# Patient Record
Sex: Female | Born: 1935 | Race: Black or African American | Hispanic: No | Smoking: Never smoker
Health system: Southern US, Community
[De-identification: ages and names within clinical notes are randomized; demographics above are authoritative.]

## PROBLEM LIST (undated history)

## (undated) DIAGNOSIS — M1990 Unspecified osteoarthritis, unspecified site: Secondary | ICD-10-CM

## (undated) DIAGNOSIS — I10 Essential (primary) hypertension: Secondary | ICD-10-CM

## (undated) DIAGNOSIS — T8859XA Other complications of anesthesia, initial encounter: Secondary | ICD-10-CM

## (undated) DIAGNOSIS — Z8489 Family history of other specified conditions: Secondary | ICD-10-CM

## (undated) DIAGNOSIS — T4145XA Adverse effect of unspecified anesthetic, initial encounter: Secondary | ICD-10-CM

## (undated) DIAGNOSIS — D869 Sarcoidosis, unspecified: Secondary | ICD-10-CM

## (undated) DIAGNOSIS — G309 Alzheimer's disease, unspecified: Secondary | ICD-10-CM

## (undated) DIAGNOSIS — E7800 Pure hypercholesterolemia, unspecified: Secondary | ICD-10-CM

## (undated) DIAGNOSIS — F0280 Dementia in other diseases classified elsewhere without behavioral disturbance: Secondary | ICD-10-CM

## (undated) DIAGNOSIS — F419 Anxiety disorder, unspecified: Secondary | ICD-10-CM

## (undated) HISTORY — DX: Essential (primary) hypertension: I10

## (undated) HISTORY — DX: Alzheimer's disease, unspecified: G30.9

## (undated) HISTORY — PX: BACK SURGERY: SHX140

## (undated) HISTORY — DX: Dementia in other diseases classified elsewhere, unspecified severity, without behavioral disturbance, psychotic disturbance, mood disturbance, and anxiety: F02.80

## (undated) HISTORY — PX: VAGINAL HYSTERECTOMY: SHX2639

## (undated) HISTORY — DX: Pure hypercholesterolemia, unspecified: E78.00

## (undated) HISTORY — PX: OTHER SURGICAL HISTORY: SHX169

## (undated) HISTORY — PX: AURAL ATRESIA REPAIR: SHX1202

## (undated) HISTORY — DX: Anxiety disorder, unspecified: F41.9

---

## 1998-07-13 ENCOUNTER — Encounter: Payer: Self-pay | Admitting: Internal Medicine

## 1998-07-13 ENCOUNTER — Ambulatory Visit (HOSPITAL_COMMUNITY): Admission: RE | Admit: 1998-07-13 | Discharge: 1998-07-13 | Payer: Self-pay | Admitting: Internal Medicine

## 1998-08-22 ENCOUNTER — Other Ambulatory Visit: Admission: RE | Admit: 1998-08-22 | Discharge: 1998-08-22 | Payer: Self-pay | Admitting: *Deleted

## 1999-07-17 ENCOUNTER — Encounter: Payer: Self-pay | Admitting: Internal Medicine

## 1999-07-17 ENCOUNTER — Ambulatory Visit (HOSPITAL_COMMUNITY): Admission: RE | Admit: 1999-07-17 | Discharge: 1999-07-17 | Payer: Self-pay | Admitting: Internal Medicine

## 1999-12-03 ENCOUNTER — Other Ambulatory Visit: Admission: RE | Admit: 1999-12-03 | Discharge: 1999-12-03 | Payer: Self-pay | Admitting: *Deleted

## 1999-12-06 ENCOUNTER — Encounter: Payer: Self-pay | Admitting: *Deleted

## 1999-12-06 ENCOUNTER — Encounter: Admission: RE | Admit: 1999-12-06 | Discharge: 1999-12-06 | Payer: Self-pay | Admitting: *Deleted

## 2000-07-20 ENCOUNTER — Encounter: Payer: Self-pay | Admitting: Internal Medicine

## 2000-07-20 ENCOUNTER — Ambulatory Visit (HOSPITAL_COMMUNITY): Admission: RE | Admit: 2000-07-20 | Discharge: 2000-07-20 | Payer: Self-pay | Admitting: Internal Medicine

## 2001-07-27 ENCOUNTER — Ambulatory Visit (HOSPITAL_COMMUNITY): Admission: RE | Admit: 2001-07-27 | Discharge: 2001-07-27 | Payer: Self-pay | Admitting: Internal Medicine

## 2001-07-27 ENCOUNTER — Encounter: Payer: Self-pay | Admitting: Internal Medicine

## 2001-11-09 ENCOUNTER — Ambulatory Visit (HOSPITAL_COMMUNITY): Admission: RE | Admit: 2001-11-09 | Discharge: 2001-11-09 | Payer: Self-pay | Admitting: Gastroenterology

## 2002-08-03 ENCOUNTER — Ambulatory Visit (HOSPITAL_COMMUNITY): Admission: RE | Admit: 2002-08-03 | Discharge: 2002-08-03 | Payer: Self-pay | Admitting: Internal Medicine

## 2002-08-03 ENCOUNTER — Encounter: Payer: Self-pay | Admitting: Internal Medicine

## 2003-08-04 ENCOUNTER — Ambulatory Visit (HOSPITAL_COMMUNITY): Admission: RE | Admit: 2003-08-04 | Discharge: 2003-08-04 | Payer: Self-pay | Admitting: Internal Medicine

## 2004-04-22 ENCOUNTER — Ambulatory Visit (HOSPITAL_COMMUNITY): Admission: RE | Admit: 2004-04-22 | Discharge: 2004-04-22 | Payer: Self-pay | Admitting: Internal Medicine

## 2004-08-05 ENCOUNTER — Ambulatory Visit (HOSPITAL_COMMUNITY): Admission: RE | Admit: 2004-08-05 | Discharge: 2004-08-05 | Payer: Self-pay | Admitting: Internal Medicine

## 2004-11-05 ENCOUNTER — Ambulatory Visit (HOSPITAL_COMMUNITY): Admission: RE | Admit: 2004-11-05 | Discharge: 2004-11-05 | Payer: Self-pay | Admitting: Gastroenterology

## 2005-08-06 ENCOUNTER — Ambulatory Visit (HOSPITAL_COMMUNITY): Admission: RE | Admit: 2005-08-06 | Discharge: 2005-08-06 | Payer: Self-pay | Admitting: Internal Medicine

## 2006-01-11 ENCOUNTER — Encounter: Admission: RE | Admit: 2006-01-11 | Discharge: 2006-01-11 | Payer: Self-pay | Admitting: Internal Medicine

## 2006-08-10 ENCOUNTER — Ambulatory Visit (HOSPITAL_COMMUNITY): Admission: RE | Admit: 2006-08-10 | Discharge: 2006-08-10 | Payer: Self-pay | Admitting: Internal Medicine

## 2007-08-09 ENCOUNTER — Ambulatory Visit (HOSPITAL_COMMUNITY): Admission: RE | Admit: 2007-08-09 | Discharge: 2007-08-09 | Payer: Self-pay | Admitting: Internal Medicine

## 2008-08-10 ENCOUNTER — Ambulatory Visit (HOSPITAL_COMMUNITY): Admission: RE | Admit: 2008-08-10 | Discharge: 2008-08-10 | Payer: Self-pay | Admitting: Internal Medicine

## 2009-08-17 ENCOUNTER — Ambulatory Visit (HOSPITAL_COMMUNITY): Admission: RE | Admit: 2009-08-17 | Discharge: 2009-08-17 | Payer: Self-pay | Admitting: Internal Medicine

## 2010-01-24 ENCOUNTER — Ambulatory Visit (HOSPITAL_COMMUNITY)
Admission: RE | Admit: 2010-01-24 | Discharge: 2010-01-24 | Payer: Self-pay | Source: Home / Self Care | Admitting: Gastroenterology

## 2010-08-07 ENCOUNTER — Other Ambulatory Visit (HOSPITAL_COMMUNITY): Payer: Self-pay | Admitting: Internal Medicine

## 2010-08-07 DIAGNOSIS — Z1231 Encounter for screening mammogram for malignant neoplasm of breast: Secondary | ICD-10-CM

## 2010-08-20 ENCOUNTER — Ambulatory Visit (HOSPITAL_COMMUNITY)
Admission: RE | Admit: 2010-08-20 | Discharge: 2010-08-20 | Disposition: A | Discharge status: Home / Self Care | Payer: Medicare Other | Source: Ambulatory Visit | Attending: Internal Medicine | Admitting: Internal Medicine

## 2010-08-20 DIAGNOSIS — Z1231 Encounter for screening mammogram for malignant neoplasm of breast: Secondary | ICD-10-CM

## 2011-07-14 ENCOUNTER — Other Ambulatory Visit (HOSPITAL_COMMUNITY): Payer: Self-pay | Admitting: Internal Medicine

## 2011-07-14 DIAGNOSIS — Z1231 Encounter for screening mammogram for malignant neoplasm of breast: Secondary | ICD-10-CM

## 2011-08-22 ENCOUNTER — Ambulatory Visit (HOSPITAL_COMMUNITY)
Admission: RE | Admit: 2011-08-22 | Discharge: 2011-08-22 | Disposition: A | Discharge status: Home / Self Care | Payer: Medicare Other | Source: Ambulatory Visit | Attending: Internal Medicine | Admitting: Internal Medicine

## 2011-08-22 DIAGNOSIS — Z1231 Encounter for screening mammogram for malignant neoplasm of breast: Secondary | ICD-10-CM | POA: Insufficient documentation

## 2012-08-27 ENCOUNTER — Ambulatory Visit: Payer: BC Managed Care – PPO | Admitting: Neurology

## 2012-09-06 ENCOUNTER — Ambulatory Visit (INDEPENDENT_AMBULATORY_CARE_PROVIDER_SITE_OTHER): Payer: Medicare PPO | Admitting: Neurology

## 2012-09-06 ENCOUNTER — Encounter: Payer: Self-pay | Admitting: Neurology

## 2012-09-06 VITALS — BP 138/84 | HR 81 | Ht 62.0 in | Wt 137.0 lb

## 2012-09-06 DIAGNOSIS — E7800 Pure hypercholesterolemia, unspecified: Secondary | ICD-10-CM | POA: Insufficient documentation

## 2012-09-06 DIAGNOSIS — R413 Other amnesia: Secondary | ICD-10-CM

## 2012-09-06 DIAGNOSIS — F411 Generalized anxiety disorder: Secondary | ICD-10-CM

## 2012-09-06 DIAGNOSIS — H8110 Benign paroxysmal vertigo, unspecified ear: Secondary | ICD-10-CM | POA: Insufficient documentation

## 2012-09-06 DIAGNOSIS — F419 Anxiety disorder, unspecified: Secondary | ICD-10-CM

## 2012-09-07 LAB — COMPREHENSIVE METABOLIC PANEL
BUN/Creatinine Ratio: 14 (ref 11–26)
CO2: 28 mmol/L (ref 19–28)
Calcium: 9.6 mg/dL (ref 8.6–10.2)
Chloride: 103 mmol/L (ref 97–108)
Creatinine, Ser: 1.01 mg/dL — ABNORMAL HIGH (ref 0.57–1.00)
GFR calc Af Amer: 59 mL/min/{1.73_m2} — ABNORMAL LOW (ref >59)
GFR calc non Af Amer: 52 mL/min/{1.73_m2} — ABNORMAL LOW (ref >59)
Globulin, Total: 3 g/dL (ref 1.5–4.5)
Glucose: 84 mg/dL (ref 65–99)
Potassium: 4.1 mmol/L (ref 3.5–5.2)
Sodium: 142 mmol/L (ref 134–144)
Total Bilirubin: 0.3 mg/dL (ref 0.0–1.2)
Total Protein: 6.9 g/dL (ref 6.0–8.5)

## 2012-09-07 LAB — CBC
Hemoglobin: 12.3 g/dL (ref 11.1–15.9)
Platelets: 214 10*3/uL (ref 155–379)
RDW: 14 % (ref 12.3–15.4)

## 2012-09-07 LAB — C-REACTIVE PROTEIN: CRP: 4.9 mg/L (ref 0.0–4.9)

## 2012-09-08 NOTE — Progress Notes (Signed)
Kelly Cross is a 79 years old right-handed African American female, accompanied by her son, referred by her primary care physician for evaluation of memory loss    She has past medical history of pulmonary sarcoidosis, was treated with steroid more than 40 years ago, also history of hypertension, anxiety, bilateral cataract surgery, she had college degree, taught sixth-grade transiently, was a homemaker,, but very actively involved in church, community service, her son stated that she really the captain of her family, has been taking care of her sick husband, who suffered Alzheimer's disease, her son who has schizophrenia, was dedicated.  She was noted to get frustrated easily over the past one year, which is not her normal self, also began to make mistakes,  she forgets to take her medications sometimes, but she is still very active at home, she is supervising her son, who suffers schizophrenia, she is  his main caregiver, supervising his medications, she is driving only short distance, but even that she got lost sometimes, she still pay home bills, but her son began to step in to help her,  She denies significant visual loss, no lateralized motor or sensory deficit, her son provide most of the story  Review of systems she complains of hearing loss, constipation, incontinence, cramps, skin sensitivity, memory loss, confusion, insomnia, vastus lateralis, anxiety, not enough sleeps, otherwise review of system was negative  PHYSICAL EXAMINATOINS:  Generalized: In no acute distress  Neck: Supple, no carotid bruits   Cardiac: Regular rate rhythm  Pulmonary: Clear to auscultation bilaterally  Musculoskeletal: No deformity  Neurological examination  Mentation: Alert oriented to time, place, history taking, and causual conversation, MMSE 27/30, she missed 3/3 recalls  Cranial nerve II-XII: Pupils were equal round reactive to light extraocular movements were full, visual field were full on  confrontational test. facial sensation and strength were normal. hearing was intact to finger rubbing bilaterally. Uvula tongue midline.  head turning and shoulder shrug and were normal and symmetric.Tongue protrusion into cheek strength was normal.  Motor: normal tone, bulk and strength.  Sensory: Intact to fine touch, pinprick, preserved vibratory sensation, and proprioception at toes.  Coordination: Normal finger to nose, heel-to-shin bilaterally there was no truncal ataxia  Gait: Rising up from seated position without assistance, normal stance, without trunk ataxia, moderate stride, good arm swing, smooth turning, able to perform tiptoe, and heel walking without difficulty.   Romberg signs: Negative  Deep tendon reflexes: Brachioradialis 2/2, biceps 2/2, triceps 2/2, patellar 2/2, Achilles 2/2, plantar responses were flexor bilaterally.  Assessment and plan:  79 years old right-handed [place] American female, with gradual onset of memory trouble for one year, most consistent with Alzheimer's disease, Mini-Mental Status Examination is 27 out of 30, she missed 3 out of 3 recalls  1, complete evaluation with MRI of the brain,  2. , laboratory evaluation  3, return to clinic 1-2 months.

## 2012-09-10 ENCOUNTER — Other Ambulatory Visit (HOSPITAL_COMMUNITY): Payer: Self-pay | Admitting: Internal Medicine

## 2012-09-10 DIAGNOSIS — Z1231 Encounter for screening mammogram for malignant neoplasm of breast: Secondary | ICD-10-CM

## 2012-09-14 ENCOUNTER — Ambulatory Visit (HOSPITAL_COMMUNITY)
Admission: RE | Admit: 2012-09-14 | Discharge: 2012-09-14 | Disposition: A | Discharge status: Home / Self Care | Payer: Medicare PPO | Source: Ambulatory Visit | Attending: Internal Medicine | Admitting: Internal Medicine

## 2012-09-14 DIAGNOSIS — Z1231 Encounter for screening mammogram for malignant neoplasm of breast: Secondary | ICD-10-CM | POA: Insufficient documentation

## 2012-09-15 ENCOUNTER — Ambulatory Visit
Admission: RE | Admit: 2012-09-15 | Discharge: 2012-09-15 | Disposition: A | Discharge status: Home / Self Care | Payer: Medicare PPO | Source: Ambulatory Visit | Attending: Neurology | Admitting: Neurology

## 2012-09-15 DIAGNOSIS — R413 Other amnesia: Secondary | ICD-10-CM

## 2012-09-15 DIAGNOSIS — F419 Anxiety disorder, unspecified: Secondary | ICD-10-CM

## 2012-09-15 DIAGNOSIS — E7800 Pure hypercholesterolemia, unspecified: Secondary | ICD-10-CM

## 2012-09-15 DIAGNOSIS — H8110 Benign paroxysmal vertigo, unspecified ear: Secondary | ICD-10-CM

## 2012-09-16 NOTE — Progress Notes (Signed)
Quick Note:  Please call patient, age related changes on MRI brain, no change in treatment plan ______ 

## 2012-09-20 NOTE — Progress Notes (Signed)
Quick Note:  Spoke to son and relayed age related changes on MRI brain, per Dr. Terrace Arabia. They are to keep follow up appt. ______

## 2012-10-06 ENCOUNTER — Ambulatory Visit (INDEPENDENT_AMBULATORY_CARE_PROVIDER_SITE_OTHER): Payer: Medicare PPO | Admitting: Neurology

## 2012-10-06 ENCOUNTER — Encounter: Payer: Self-pay | Admitting: Neurology

## 2012-10-06 VITALS — BP 148/70 | HR 58 | Ht 62.0 in | Wt 137.5 lb

## 2012-10-06 DIAGNOSIS — R413 Other amnesia: Secondary | ICD-10-CM

## 2012-10-06 MED ORDER — MEMANTINE HCL ER 28 MG PO CP24: 28.0000 mg | ORAL_CAPSULE | Freq: Every day | ORAL | Status: DC

## 2012-10-06 MED ORDER — MEMANTINE HCL ER 7 & 14 & 21 &28 MG PO CP24: 1.0000 | ORAL_CAPSULE | Freq: Every day | ORAL | Status: DC

## 2012-10-06 NOTE — Progress Notes (Signed)
Kelly Cross is a 79 years old right-handed African American female, accompanied by her son, referred by her primary care physician for evaluation of memory loss    She has past medical history of pulmonary sarcoidosis, was treated with steroid more than 40 years ago, also history of hypertension, anxiety, bilateral cataract surgery, she had college degree, taught sixth-grade transiently, was a homemaker,, but very actively involved in church, community service, her son stated that she really the captain of her family, has been taking care of her sick husband, who suffered Alzheimer's disease, her son who has schizophrenia, was dedicated.  She was noted to get frustrated easily over the past one year, which is not her normal self, also began to make mistakes,  she forgets to take her medications sometimes, but she is still very active at home, she is supervising her son, who suffers schizophrenia, she is  his main caregiver, supervising his medications, she is driving only short distance, but even that she got lost sometimes, she still pay home bills, but her son began to step in to help her,  She denies significant visual loss, no lateralized motor or sensory deficit, her son provide most of the story  UPDATE May 14th 2014: MRI of the brain showed mild atrophy, laboratory evaluation was normal, including B12,  Son reported, she was highly functional, now struggling with her daily activity, no longer driving  Review of systems she complains of hearing loss, constipation, incontinence, cramps, skin sensitivity, memory loss, confusion, insomnia, vastus lateralis, anxiety, not enough sleeps, otherwise review of system was negative  PHYSICAL EXAMINATOINS:  Generalized: In no acute distress  Neck: Supple, no carotid bruits   Cardiac: Regular rate rhythm  Pulmonary: Clear to auscultation bilaterally  Musculoskeletal: No deformity  Neurological examination  Mentation: Alert oriented to time,  place, history taking, and causual conversation, MMSE 26/30, she missed 2/3 recalls, has difficulty spelling world backwards  Cranial nerve II-XII: Pupils were equal round reactive to light extraocular movements were full, visual field were full on confrontational test. facial sensation and strength were normal. hearing was intact to finger rubbing bilaterally. Uvula tongue midline.  head turning and shoulder shrug and were normal and symmetric.Tongue protrusion into cheek strength was normal.  Motor: normal tone, bulk and strength.  Sensory: Intact to fine touch, pinprick, preserved vibratory sensation, and proprioception at toes.  Coordination: Normal finger to nose, heel-to-shin bilaterally there was no truncal ataxia  Gait: Rising up from seated position without assistance, normal stance, without trunk ataxia, moderate stride, good arm swing, smooth turning, able to perform tiptoe, and heel walking without difficulty.   Romberg signs: Negative  Deep tendon reflexes: Brachioradialis 2/2, biceps 2/2, triceps 2/2, patellar 2/2, Achilles 2/2, plantar responses were flexor bilaterally.  Assessment and plan:  79 years old right-handed [place] American female, with gradual onset of memory trouble for one year, most consistent with Alzheimer's disease, Mini-Mental Status Examination is 26 out of 30, she missed 2 out of 3 recalls  1. starting Namenda titrating to 28 mg every day. 2. moderate exercise. 3. may consider LZAX trial if her MMSE is <=to 26 persistently. 4. RTC in 4 months

## 2012-10-29 ENCOUNTER — Other Ambulatory Visit: Payer: Self-pay | Admitting: Neurology

## 2012-11-16 ENCOUNTER — Telehealth: Payer: Self-pay | Admitting: *Deleted

## 2012-11-16 NOTE — Telephone Encounter (Signed)
Pt's son Burton Apley is concerned with the medication Memantine HCl ER (NAMENDA XR) 28 MG that the pt has been on. He fills that she is falling backwards on things, seeing changes per son. Mom left the house while son was gone and had keys with her but when he got back she still had keys with her but didn't realize that she did. Difficulty oriented in the home by herself, looking for things that may take her all day but normally doesn't. Pt's son needs someone to please call him about his mom and he thinks she may need to come in earlier. You can reach son on his cell # Thanks

## 2012-11-16 NOTE — Telephone Encounter (Signed)
I have called her son, she locked herself out of the house with her key in her pocket,  She is also more frustrated with her son who suffered schizophrenia. She is no longer driving. She is more confused.

## 2013-02-10 ENCOUNTER — Ambulatory Visit (INDEPENDENT_AMBULATORY_CARE_PROVIDER_SITE_OTHER): Payer: Medicare PPO | Admitting: Neurology

## 2013-02-10 ENCOUNTER — Encounter: Payer: Self-pay | Admitting: Neurology

## 2013-02-10 VITALS — BP 144/73 | HR 75 | Ht 63.0 in | Wt 130.0 lb

## 2013-02-10 DIAGNOSIS — F419 Anxiety disorder, unspecified: Secondary | ICD-10-CM

## 2013-02-10 DIAGNOSIS — E7800 Pure hypercholesterolemia, unspecified: Secondary | ICD-10-CM

## 2013-02-10 DIAGNOSIS — F411 Generalized anxiety disorder: Secondary | ICD-10-CM

## 2013-02-10 DIAGNOSIS — R413 Other amnesia: Secondary | ICD-10-CM

## 2013-02-10 NOTE — Progress Notes (Signed)
Kelly Cross is a 79 years old right-handed African American female, accompanied by her son, referred by her primary care physician for evaluation of memory loss    She has past medical history of pulmonary sarcoidosis, was treated with steroid more than 40 years ago, also history of hypertension, anxiety, bilateral cataract surgery, she had college degree, taught sixth-grade transiently, was a homemaker,, but very actively involved in church, community service, her son stated that she really the captain of her family, has been taking care of her sick husband, who suffered Alzheimer's disease, her son who has schizophrenia, was dedicated.  She was noted to get frustrated easily over the past one year, which is not her normal self, also began to make mistakes,  she forgets to take her medications sometimes, but she is still very active at home, she is supervising her son, who suffers schizophrenia, she is  his main caregiver, supervising his medications, she is driving only short distance, but even that she got lost sometimes, she still pay home bills, but her son began to step in to help her,  She denies significant visual loss, no lateralized motor or sensory deficit, her son provide most of the story  MRI of the brain showed mild atrophy, laboratory evaluation was normal, including B12,  Son reported, she was highly functional, now struggling with her daily activity, no longer driving, she also has hard of hearing   UPDATE Sept 18th 2014: She is tolerating Namenda xr 28 mg every day, there was no significant side effect, but she has increased anxiety dealing with her son who suffered mental illness, she has not taking Zoloft 100 mg, Lunesta 2 mg every night, she complains some morning drowsiness, mild gait difficulty,  Review of systems she complains of hearing loss, memory loss, confusion, unsteady gait, depression, anxiety, incontinence  PHYSICAL EXAMINATOINS:  Generalized: In no acute  distress  Neck: Supple, no carotid bruits   Cardiac: Regular rate rhythm  Pulmonary: Clear to auscultation bilaterally  Musculoskeletal: No deformity  Neurological examination  Mentation: Alert oriented to time, place, history taking, and causual conversation, MMSE 28/30, she missed 1/3 recalls, has difficulty spelling world backwards  Cranial nerve II-XII: Pupils were equal round reactive to light extraocular movements were full, visual field were full on confrontational test. facial sensation and strength were normal. Hard of hearing . Uvula tongue midline.  head turning and shoulder shrug and were normal and symmetric.Tongue protrusion into cheek strength was normal.  Motor: normal tone, bulk and strength.  Sensory: Intact to fine touch, pinprick, preserved vibratory sensation, and proprioception at toes.  Coordination: Normal finger to nose, heel-to-shin bilaterally there was no truncal ataxia  Gait: Rising up from seated position by pushing on chair arm, cautious, mildly unsteady, could not perform tandem walking,  Romberg signs: Negative  Deep tendon reflexes: Brachioradialis 2/2, biceps 2/2, triceps 2/2, patellar 2/2, Achilles 2/2, plantar responses were flexor bilaterally.  Assessment and plan:  79 years old right-handed [place] American female, with gradual onset of memory trouble for one year, most consistent with Alzheimer's disease, Mini-Mental Status Examination is 26 out of 30, she missed 2 out of 3 recalls  1.  Namenda xr 28 mg every day. Add on aricept 10mg  qday 2. moderate exercise. 3.  RTC to clinic with Eber Jones in 12 months 4. Her increased gait difficulty is most likely due to medicine side effect.

## 2013-02-11 ENCOUNTER — Other Ambulatory Visit: Payer: Self-pay

## 2013-02-11 ENCOUNTER — Telehealth: Payer: Self-pay | Admitting: Neurology

## 2013-02-11 MED ORDER — DONEPEZIL HCL 5 MG PO TABS: 5.0000 mg | ORAL_TABLET | Freq: Every day | ORAL | Status: DC

## 2013-02-11 MED ORDER — DONEPEZIL HCL 10 MG PO TABS: 10.0000 mg | ORAL_TABLET | Freq: Every day | ORAL | Status: DC

## 2013-02-11 NOTE — Telephone Encounter (Signed)
Patient's son left message that the Aricept was not called into [facility] on [address].  I checked and prescription was faxed after he left message.  I called son but his mailbox was full and I was unable to leave message.

## 2013-02-11 NOTE — Telephone Encounter (Signed)
Son came into the office requesting Rx for Aricept.  OV note says patient will start Aricept, however, it was not added to the med list.  Rx has been added and sent to the pharmacy.

## 2013-02-15 ENCOUNTER — Telehealth: Payer: Self-pay | Admitting: Neurology

## 2013-02-17 NOTE — Telephone Encounter (Signed)
I called and and spoke to son.  He had questions about taking the aricept.  Pharmacy stated pm and Dr. Terrace Arabia had said am.   I told him that I would follow Dr. Zannie Cove recommendation about taking this medication.

## 2013-03-06 ENCOUNTER — Other Ambulatory Visit: Payer: Self-pay | Admitting: Neurology

## 2013-08-18 ENCOUNTER — Telehealth: Payer: Self-pay | Admitting: Nurse Practitioner

## 2013-08-18 NOTE — Telephone Encounter (Signed)
Called pt's son [name] to inform him about the [facility] and gave him the number and also advised the son that the pt's PCP can recommend a dermatologist for the pt and that he can stop at any pharmacy for a medication box. I advised the son that if the pt has any other problems, questions or concerns to call the office. Son verbalized understanding.

## 2013-08-18 NOTE — Telephone Encounter (Signed)
°  Patient's son walked into the lobby requesting three things: 1. Recommendation for an  in-home aide to help with his mother who has Alzheimer's. 2. Where he can purchase a medication box that holds 35 squares. 3. Recommendation for a dermatologist.   Please call to advise

## 2013-08-24 ENCOUNTER — Other Ambulatory Visit: Payer: Self-pay | Admitting: Neurology

## 2013-09-22 ENCOUNTER — Telehealth: Payer: Self-pay | Admitting: Nurse Practitioner

## 2013-09-22 NOTE — Telephone Encounter (Signed)
Patient's son came to the lobby asking for a sooner appointment for his mother. For the past 12 days she has asked every day when is my appointment with "the lady."  Son wants the appointment to be with Dr. Terrace ArabiaYan.  Son feels mother's health declining very quickly. He believes she needs nursing care in the home. He is wanting a call back from a nurse to discuss what his options are. Please call to advise.

## 2013-09-23 NOTE — Telephone Encounter (Signed)
Patient's son is returning your call.

## 2013-09-23 NOTE — Telephone Encounter (Signed)
I called and LMVM for son f/u on his message.

## 2013-09-26 NOTE — Telephone Encounter (Signed)
Pt came by office.   I spoke with him.  He is concerned that his mom is getting worse.  She has been asking about an appt with lady doctor.  He reminds her that appt is in September 2015 with NP.  He is POA of pt and also of his brother that has schizophrenia.  Mother has been main caregiver for son with schizophrenia.  Changes with memory, but also some other issues (pain, anxiety, sleep).  See's Dr. Earl Galasborne. Pcp.  Will soon need pcp since he will be leaving the practice he's with and doing more sleep?  I gave him the in formation on [facility].  Told him to call and speak to them.  [facility], etc.  I told him when in for visit to mention this issue.  (may benefit also SW).  [facility] would incorporate all this if qualifies and is excepted.  Made appt sooner with LLam/ NP 10-11-13 at 1130 (not confirmed with son-Ogden yet).

## 2013-09-27 NOTE — Telephone Encounter (Signed)
Mailbox full

## 2013-09-28 ENCOUNTER — Encounter: Payer: Self-pay | Admitting: *Deleted

## 2013-09-28 NOTE — Telephone Encounter (Signed)
  I called again and mailbox full cannot LM.  Mailed letter.

## 2013-10-03 ENCOUNTER — Other Ambulatory Visit: Payer: Self-pay | Admitting: Neurology

## 2013-10-06 ENCOUNTER — Telehealth: Payer: Self-pay | Admitting: Neurology

## 2013-10-06 NOTE — Telephone Encounter (Signed)
Patient's son calling to state [facility] is [facility], please call and advise patient.

## 2013-10-06 NOTE — Telephone Encounter (Signed)
Spoke with son and he said that he was just coming in to confirm the  appt.for 10/11/13

## 2013-10-11 ENCOUNTER — Encounter: Payer: Self-pay | Admitting: Nurse Practitioner

## 2013-10-11 ENCOUNTER — Ambulatory Visit (INDEPENDENT_AMBULATORY_CARE_PROVIDER_SITE_OTHER): Payer: Medicare PPO | Admitting: Nurse Practitioner

## 2013-10-11 VITALS — BP 157/65 | HR 47 | Ht 62.0 in | Wt 128.0 lb

## 2013-10-11 DIAGNOSIS — G309 Alzheimer's disease, unspecified: Principal | ICD-10-CM

## 2013-10-11 DIAGNOSIS — R269 Unspecified abnormalities of gait and mobility: Secondary | ICD-10-CM

## 2013-10-11 DIAGNOSIS — R2681 Unsteadiness on feet: Secondary | ICD-10-CM | POA: Insufficient documentation

## 2013-10-11 DIAGNOSIS — F0280 Dementia in other diseases classified elsewhere without behavioral disturbance: Secondary | ICD-10-CM | POA: Insufficient documentation

## 2013-10-11 NOTE — Progress Notes (Signed)
PATIENT: Kelly Cross DOB: 11-19-1926  REASON FOR VISIT: follow up for memory loss HISTORY FROM: patient  HISTORY OF PRESENT ILLNESS: Kelly Cross is a 80 years old right-handed African American female, accompanied by her son, referred by her primary care physician for evaluation of memory loss.   She has past medical history of pulmonary sarcoidosis, was treated with steroid more than 40 years ago, also history of hypertension, anxiety, bilateral cataract surgery, she had college degree, taught sixth-grade transiently, was a homemaker,, but very actively involved in church, community service, her son stated that she really the captain of her family, has been taking care of her sick husband, who suffered Alzheimer's disease, her son who has schizophrenia, was dedicated.  She was noted to get frustrated easily over the past one year, which is not her normal self, also began to make mistakes, she forgets to take her medications sometimes, but she is still very active at home, she is supervising her son, who suffers schizophrenia, she is his main caregiver, supervising his medications, she is driving only short distance, but even that she got lost sometimes, she still pay home bills, but her son began to step in to help her,  She denies significant visual loss, no lateralized motor or sensory deficit, her son provide most of the story  MRI of the brain showed mild atrophy, laboratory evaluation was normal, including B12. Son reported, she was highly functional, now struggling with her daily activity, no longer driving, she also has hard of hearing   UPDATE Sept 18th 2014:  She is tolerating Namenda xr 28 mg every day, there was no significant side effect, but she has increased anxiety dealing with her son who suffered mental illness, she has not taking Zoloft 100 mg, Lunesta 2 mg every night, she complains some morning drowsiness, mild gait difficulty.  UPDATE 10/11/13 (LL):  Patient comes  in with son today who requested sooner revisit.  Son feels mother's health declining very quickly. He believes she needs 24 hour nursing care in the home, and he cannot be there at all times due to work.  He has looking in to having a nurse aide come in to the home, but having a schizophrenic brother in the home has made some agencies decline. Tolerating Namenda XR and Aricept, but having difficulty sleeping at night.  She is still independent with bathing, dressing, feeding, having urinary incontinence.  Needs help with all IADLs. Son states mother has long-term care insurance plan but he really wants to keep her at home if at all possible, wants to keep her safe.  Review of systems she complains of hearing loss, memory loss, cough, unsteady gait, anxiety, incontinence of bladder, frequent waking, daytime sleepiness  ALLERGIES: No Known Allergies  HOME MEDICATIONS: Outpatient Prescriptions Prior to Visit  Medication Sig Dispense Refill  . Acetaminophen (TYLENOL EXTRA STRENGTH PO) Take 500 mg by mouth 2 (two) times daily.      Marland Kitchen CALCIUM ASPARTATE PO Take by mouth daily.      Marland Kitchen donepezil (ARICEPT) 5 MG tablet Take 1 tablet (5 mg total) by mouth at bedtime. For one month then increase to 10mg  nightly.  (10mg  Rx has been sent separately)  30 tablet  0  . eszopiclone (LUNESTA) 2 MG TABS tablet Take 2 mg by mouth daily.      Marland Kitchen gabapentin (NEURONTIN) 300 MG capsule 300 mg 2 (two) times daily.      . hydrochlorothiazide (HYDRODIURIL) 25 MG tablet Take  25 mg by mouth daily.      . Multiple Vitamins-Minerals (CENTRUM SILVER PO) Take by mouth daily.      Marland Kitchen NAMENDA XR 28 MG CP24 TAKE 1 CAPSULE BY MOUTH EVERY DAY  30 capsule  0  . sertraline (ZOLOFT) 50 MG tablet 100 mg daily.       Marland Kitchen donepezil (ARICEPT) 10 MG tablet Take 1 tablet (10 mg total) by mouth at bedtime.  90 tablet  1  . Memantine HCl ER (NAMENDA XR TITRATION PACK) 7 & 14 & 21 &28 MG CP24 Take 1 capsule by mouth daily.  30 capsule  0   No  facility-administered medications prior to visit.     PHYSICAL EXAM  Filed Vitals:   10/11/13 1126  BP: 157/65  Pulse: 47  Height: 5\' 2"  (1.575 m)  Weight: 128 lb (58.06 kg)   Body mass index is 23.41 kg/(m^2). No exam data present   Generalized: In no acute distress  Neck: Supple, no carotid bruits  Cardiac: Regular rate rhythm  Pulmonary: Clear to auscultation bilaterally  Musculoskeletal: No deformity   Neurological examination  Mentation: Alert oriented to time, place, history taking, and casual conversation, MMSE 23/30, 0/3 recalls, has difficulty spelling world backwards. AFT 6 only, CDT 4/4, GDS 6 suggests depression. Cranial nerve II-XII: Pupils were equal round reactive to light extraocular movements were full, visual field were full on confrontational test. facial sensation and strength were normal. Hard of hearing. Uvula tongue midline. head turning and shoulder shrug and were normal and symmetric. Tongue protrusion into cheek strength was normal.  Motor: normal tone, bulk and strength.  Sensory: Intact to fine touch in all 4 extremities. Coordination: Normal finger to nose, heel-to-shin bilaterally there was no truncal ataxia  Gait: Rising up from seated position by pushing on chair arm, cautious, mildly unsteady, could not perform tandem walking,  Romberg signs: Negative  Deep tendon reflexes: Brachioradialis 2/2, biceps 2/2, triceps 2/2, patellar 2/2, Achilles 2/2, plantar responses were flexor bilaterally.   ASSESSMENT AND PLAN 80 year old right-handed [place] American female, with gradual onset of memory trouble for one year, most consistent with Alzheimer's disease, Mini-Mental Status Examination is 24 out of 30, she missed 3 out of 3 recalls, AFT 6, CDT 4/4, GSD 6.  Son is becoming increasingly concerned for his mother's safety while he is away from home and has no other family resources to help.  He is looking for resources in the community to continue having his  mother live in his home.  Currently she is mostly independent in ADLs, but needs help with all IADLs.  I would like to see if we can get her qualified for [facility] services, a referral will be made.  Son is in agreement.  1. Continue Namenda xr 28 mg every day. Aricept 10mg  q day  2. moderate exercise.  3. RTC to clinic with Dr. Terrace Arabia in 4 months 4. Referral to [facility], if denied, Home Health RN for home needs evaluation and PT for gait and balance training.  I had a long discussion with the patient and son regarding her memory problems, discussed available resources in the community and answered questions.   Orders Placed This Encounter  Procedures  . AMB REFERRAL TO [facility]   Tawny Asal Pheonix Clinkscale, MSN, NP-C 10/11/2013, 1:23 PM [facility] [address] [place], [place] [address] [phone]  Note: This document was prepared with digital dictation and possible smart phrase technology. Any  transcriptional errors that result from this process are unintentional.

## 2013-10-11 NOTE — Patient Instructions (Addendum)
We are referring you for the [facility] program, information was given during visit.  I will send a referral form by fax today.  You should receive a call from them within 2 weeks.  Continue Donepizil (Aricept) and Namenda.  You may try changing Aricept to taking in the morning to help with sleep at night.  Sometimes this medication is stimulating which keep patients awake.  Keep next regularly scheduled appointment with Dr. Terrace ArabiaYan.

## 2013-10-31 ENCOUNTER — Other Ambulatory Visit: Payer: Self-pay | Admitting: Neurology

## 2013-12-03 ENCOUNTER — Other Ambulatory Visit: Payer: Self-pay | Admitting: Neurology

## 2014-02-10 ENCOUNTER — Ambulatory Visit: Payer: Medicare PPO | Admitting: Nurse Practitioner

## 2014-02-13 ENCOUNTER — Ambulatory Visit (INDEPENDENT_AMBULATORY_CARE_PROVIDER_SITE_OTHER): Payer: Medicare PPO | Admitting: Neurology

## 2014-02-13 ENCOUNTER — Encounter (INDEPENDENT_AMBULATORY_CARE_PROVIDER_SITE_OTHER): Payer: Self-pay

## 2014-02-13 ENCOUNTER — Other Ambulatory Visit: Payer: Self-pay | Admitting: Neurology

## 2014-02-13 ENCOUNTER — Encounter: Payer: Self-pay | Admitting: Neurology

## 2014-02-13 VITALS — BP 147/58 | HR 67 | Ht 62.0 in | Wt 123.0 lb

## 2014-02-13 DIAGNOSIS — R413 Other amnesia: Secondary | ICD-10-CM

## 2014-02-13 DIAGNOSIS — R2681 Unsteadiness on feet: Secondary | ICD-10-CM

## 2014-02-13 DIAGNOSIS — R269 Unspecified abnormalities of gait and mobility: Secondary | ICD-10-CM

## 2014-02-13 MED ORDER — MEMANTINE HCL-DONEPEZIL HCL ER 28-10 MG PO CP24: 1.0000 | ORAL_CAPSULE | Freq: Every day | ORAL | Status: DC

## 2014-02-13 NOTE — Progress Notes (Signed)
PATIENT: Kelly Cross DOB: 03-03-1927  REASON FOR VISIT: follow up for memory loss HISTORY FROM: patient  HISTORY OF PRESENT ILLNESS: Kelly Cross is a 80 years old right-handed African American female, accompanied by her son, referred by her primary care physician for evaluation of memory loss.   She has past medical history of pulmonary sarcoidosis, was treated with steroid more than 40 years ago, also history of hypertension, anxiety, bilateral cataract surgery, she had college degree, taught sixth-grade transiently, was a homemaker,, but very actively involved in church, community service, her son stated that she really the captain of her family, has been taking care of her sick husband, who suffered Alzheimer's disease, her son who has schizophrenia, was dedicated.   She was noted to get frustrated easily over the past one year, which is not her normal self, also began to make mistakes, she forgets to take her medications sometimes, but she is still very active at home, she is supervising her son, who suffers schizophrenia, she is his main caregiver, supervising his medications, she is driving only short distance, but even that she got lost sometimes, she still pay home bills, but her son began to step in to help her She denies significant visual loss, no lateralized motor or sensory deficit, her son provide most of the story   MRI of the brain showed mild atrophy, laboratory evaluation was normal, including B12.  Son reported, she was highly functional, now struggling with her daily activity, no longer driving, she also has hard of hearing   UPDATE Sept 18th 2014:  She is tolerating Namenda xr 28 mg every day, there was no significant side effect, but she has increased anxiety dealing with her son who suffered mental illness, she has not taking Zoloft 100 mg, Lunesta 2 mg every night, she complains some morning drowsiness, mild gait difficulty.  UPDATE 10/11/13 (LL):  Patient  comes in with son today who requested sooner revisit.  Son feels mother's health declining very quickly. He believes she needs 24 hour nursing care in the home, and he cannot be there at all times due to work.  He has looking in to having a nurse aide come in to the home, but having a schizophrenic brother in the home has made some agencies decline. Tolerating Namenda XR and Aricept, but having difficulty sleeping at night.  She is still independent with bathing, dressing, feeding, having urinary incontinence.  Needs help with all IADLs. Son states mother has long-term care insurance plan but he really wants to keep her at home if at all possible, wants to keep her safe.  UPDATE Feb 13 2014: She continue decline with her memory, increased confusion, also mild unsteady gait, complains of frequent low back pain, taking Tylenol almost every night to sleep, she used to take gabapentin 300 mg twice a day for restless leg syndrome, was not sure it has been helpful, she is taking Namenda, and Aricept.  She still takes care of her younger son, who suffered  Schizophrenic.  Review of systems she complains of memory loss, mild gait difficulty, low back pain, ALLERGIES: No Known Allergies  HOME MEDICATIONS: Outpatient Prescriptions Prior to Visit  Medication Sig Dispense Refill  . Acetaminophen (TYLENOL EXTRA STRENGTH PO) Take 500 mg by mouth 2 (two) times daily.      Marland Kitchen CALCIUM ASPARTATE PO Take by mouth daily.      Marland Kitchen donepezil (ARICEPT) 5 MG tablet Take 1 tablet (5 mg total) by mouth at  bedtime. For one month then increase to  nightly.  (  Rx has been sent separately)  30 tablet  0  . eszopiclone (LUNESTA) 2 MG TABS tablet Take 2 mg by mouth daily.      Marland Kitchen gabapentin (NEURONTIN) 300 MG capsule 300 mg 2 (two) times daily.      . hydrochlorothiazide (HYDRODIURIL) 25 MG tablet Take 25 mg by mouth daily.      . Multiple Vitamins-Minerals (CENTRUM SILVER PO) Take by mouth daily.      Marland Kitchen NAMENDA XR 28 MG  CP24 TAKE ONE CAPSULE BY MOUTH EVERY DAY  30 capsule  3  . neomycin-polymyxin b-dexamethasone (MAXITROL) 3.5-10000-0.1 OINT       . omeprazole (PRILOSEC) 20 MG capsule Take 20 mg by mouth daily.      . prednisoLONE acetate (PRED FORTE) 1 % ophthalmic suspension Place 1 drop into both eyes 3 (three) times daily.      . sertraline (ZOLOFT) 50 MG tablet 100 mg daily.        No facility-administered medications prior to visit.     PHYSICAL EXAM  There were no vitals filed for this visit. There is no weight on file to calculate BMI. No exam data present   Generalized: In no acute distress  Neck: Supple, no carotid bruits  Cardiac: Regular rate rhythm  Pulmonary: Clear to auscultation bilaterally  Musculoskeletal: No deformity   Neurological examination  Mentation: Alert oriented to time, place, history taking, and casual conversation, MMSE 23/30, 0/3 recalls, has difficulty spelling world backwards. AFT 6 only, CDT 4/4, GDS 6 suggests depression. Cranial nerve II-XII: Pupils were equal round reactive to light extraocular movements were full, visual field were full on confrontational test. facial sensation and strength were normal. Hard of hearing. Uvula tongue midline. head turning and shoulder shrug and were normal and symmetric. Tongue protrusion into cheek strength was normal.  Motor: normal tone, bulk and strength.  Sensory: Intact to fine touch in all 4 extremities. Coordination: Normal finger to nose, heel-to-shin bilaterally there was no truncal ataxia  Gait: Rising up from seated position by pushing on chair arm, cautious, mildly unsteady, could not perform tandem walking,  Romberg signs: Negative  Deep tendon reflexes: Brachioradialis 2/2, biceps 2/2, triceps 2/2, patellar 2/2, Achilles 2/2, plantar responses were flexor bilaterally.   ASSESSMENT AND PLAN 80 year old right-handed [place] American female, with gradual onset of memory trouble for one year, most consistent with  Alzheimer's disease, Mini-Mental Status Examination is 29 out of 30 today, she missed 1out of 3 recalls.  Her son is becoming increasingly concerned for his mother's safety while he is away from home and has no other family resources to help.    1. Will start Namazaric one tab po qday  2.  Tapering off gabapentin 3. Home Physical therapy. 4.  RTC in 9-12 months with NP   Levert Feinstein, M.D. Ph.D. [facility] [address] [place], [place] [address] [phone]

## 2014-03-13 ENCOUNTER — Other Ambulatory Visit (HOSPITAL_COMMUNITY): Payer: Self-pay | Admitting: Internal Medicine

## 2014-03-13 DIAGNOSIS — Z1231 Encounter for screening mammogram for malignant neoplasm of breast: Secondary | ICD-10-CM

## 2014-03-21 ENCOUNTER — Ambulatory Visit: Payer: Medicare PPO | Admitting: Physical Therapy

## 2014-03-22 ENCOUNTER — Other Ambulatory Visit (HOSPITAL_COMMUNITY): Payer: Self-pay | Admitting: Internal Medicine

## 2014-03-22 ENCOUNTER — Ambulatory Visit: Payer: Medicare PPO

## 2014-03-22 ENCOUNTER — Ambulatory Visit (HOSPITAL_COMMUNITY)
Admission: RE | Admit: 2014-03-22 | Discharge: 2014-03-22 | Disposition: A | Discharge status: Home / Self Care | Payer: Medicare PPO | Source: Ambulatory Visit | Attending: Internal Medicine | Admitting: Internal Medicine

## 2014-03-22 DIAGNOSIS — Z1231 Encounter for screening mammogram for malignant neoplasm of breast: Secondary | ICD-10-CM | POA: Insufficient documentation

## 2014-03-24 ENCOUNTER — Ambulatory Visit: Payer: Medicare PPO | Attending: Internal Medicine

## 2014-03-24 DIAGNOSIS — H8110 Benign paroxysmal vertigo, unspecified ear: Secondary | ICD-10-CM | POA: Diagnosis not present

## 2014-03-24 DIAGNOSIS — Z5189 Encounter for other specified aftercare: Secondary | ICD-10-CM | POA: Insufficient documentation

## 2014-03-24 DIAGNOSIS — G309 Alzheimer's disease, unspecified: Secondary | ICD-10-CM | POA: Insufficient documentation

## 2014-03-24 DIAGNOSIS — I10 Essential (primary) hypertension: Secondary | ICD-10-CM | POA: Insufficient documentation

## 2014-03-24 DIAGNOSIS — F0280 Dementia in other diseases classified elsewhere without behavioral disturbance: Secondary | ICD-10-CM | POA: Diagnosis not present

## 2014-03-24 DIAGNOSIS — R269 Unspecified abnormalities of gait and mobility: Secondary | ICD-10-CM | POA: Diagnosis not present

## 2014-03-24 DIAGNOSIS — E780 Pure hypercholesterolemia: Secondary | ICD-10-CM | POA: Insufficient documentation

## 2014-04-06 ENCOUNTER — Encounter: Payer: Self-pay | Admitting: *Deleted

## 2014-04-18 ENCOUNTER — Encounter: Payer: Self-pay | Admitting: Physical Therapy

## 2014-04-18 ENCOUNTER — Ambulatory Visit: Payer: Medicare PPO | Attending: Internal Medicine | Admitting: Physical Therapy

## 2014-04-18 DIAGNOSIS — I10 Essential (primary) hypertension: Secondary | ICD-10-CM | POA: Diagnosis not present

## 2014-04-18 DIAGNOSIS — H8110 Benign paroxysmal vertigo, unspecified ear: Secondary | ICD-10-CM | POA: Insufficient documentation

## 2014-04-18 DIAGNOSIS — F0280 Dementia in other diseases classified elsewhere without behavioral disturbance: Secondary | ICD-10-CM | POA: Diagnosis not present

## 2014-04-18 DIAGNOSIS — E780 Pure hypercholesterolemia: Secondary | ICD-10-CM | POA: Diagnosis not present

## 2014-04-18 DIAGNOSIS — Z5189 Encounter for other specified aftercare: Secondary | ICD-10-CM | POA: Diagnosis not present

## 2014-04-18 DIAGNOSIS — R269 Unspecified abnormalities of gait and mobility: Secondary | ICD-10-CM

## 2014-04-18 DIAGNOSIS — G309 Alzheimer's disease, unspecified: Secondary | ICD-10-CM | POA: Insufficient documentation

## 2014-04-18 NOTE — Patient Instructions (Signed)
Tandem Walking  Near counter for safety Walk with each foot directly in front of other, heel of one foot touching toes of other foot with each step. Both feet straight ahead. Walk the line forward and backward 10 repetitions.   Copyright  VHI. All rights reserved.  FUNCTIONAL MOBILITY: Marching - Standing   March forward and backward along counter. _10__ reps per set,  _7__ days per week Hold onto a support.  Copyright  VHI. All rights reserved.  Braiding   Move to side: 1) cross right leg in front of left, 2) bring back leg out to side, then 3) cross right leg behind left, 4) bring left leg out to side. Continue sequence in same direction. Reverse sequence, moving in opposite direction. Repeat sequence __10__ times per session. Do _1___ sessions per day.   Copyright  VHI. All rights reserved.  Crossovers   Move to side: 1) cross right leg in front, then 2) bring back leg out to side. Repeat sequence in same direction. Reverse sequence, moving in opposite direction. Repeat sequence _10___ times per session. Do __1__ sessions per day.  Repeat crossing foot behind other leg 10 times.   Copyright  VHI. All rights reserved.  Side Stepping   Near counter. Side step to right 10 steps and to left 10 steps.  Copyright  VHI. All rights reserved.  Hip Backward Kick   Using a chair or counter for balance, keep legs shoulder width apart and toes pointed for- ward. Slowly extend one leg back, keeping knee straight. Do not lean forward. Repeat with other leg. Alternate legs to work balance. Repeat __10__ times. Do __1__ sessions per day.  http://gt2.exer.us/341   Copyright  VHI. All rights reserved.  ABDUCTION: Standing (Active)   Stand, feet flat. Lift right leg out to side. No weights . Alternate legs for difficulty. Complete  __10_ repetitions.   http://gtsc.exer.us/111   Copyright  VHI. All rights reserved.  Hip Flexion   Alternate legs kicking forward. Do  _10___ repetitions,  http://bt.exer.us/41   It is important to avoid accidents which may result in broken bones.  Here are a few ideas on how to make your home safer so you will be less likely to trip or fall.  1. Use nonskid mats or non slip strips in your shower or tub, on your bathroom floor and around sinks.  If you know that you have spilled water, wipe it up! 2. In the bathroom, it is important to have properly installed grab bars on the walls or on the edge of the tub.  Towel racks are NOT strong enough for you to hold onto or to pull on for support. 3. Stairs and hallways should have enough light.  Add lamps or night lights if you need ore light. 4. It is good to have handrails on both sides of the stairs if possible.  Always fix broken handrails right away. 5. It is important to see the edges of steps.  Paint the edges of outdoor steps white so you can see them better.  Put colored tape on the edge of inside steps. 6. Throw-rugs are dangerous because they can slide.  Removing the rugs is the best idea, but if they must stay, add adhesive carpet tape to prevent slipping. 7. Do not keep things on stairs or in the halls.  Remove small furniture that blocks the halls as it may cause you to trip.  Keep telephone and electrical cords out of the way where you walk.  8. Always were sturdy, rubber-soled shoes for good support.  Never wear just socks, especially on the stairs.  Socks may cause you to slip or fall.  Do not wear full-length housecoats as you can easily trip on the bottom.  9. Place the things you use the most on the shelves that are the easiest to reach.  If you use a stepstool, make sure it is in good condition.  If you feel unsteady, DO NOT climb, ask for help. If a health professional advises you to use a cane or walker, do not be ashamed.  These items can keep you from falling and breaking your bones.  Copyright  VHI. All rights reserved.

## 2014-04-18 NOTE — Therapy (Signed)
Physical Therapy Treatment  Patient Details  Name: Kelly Cross MRN: [id number] Date of Birth: 1927/02/11  Encounter Date: 04/18/2014      PT End of Session - 04/18/14 1618    Visit Number 2   Number of Visits 9   Date for PT Re-Evaluation 05/24/14   PT Start Time 1318   PT Stop Time 1400   PT Time Calculation (min) 42 min   Equipment Utilized During Treatment Gait belt   Activity Tolerance Patient tolerated treatment well   Behavior During Therapy [facility] for tasks assessed/performed      Past Medical History  Diagnosis Date  . High blood pressure   . High cholesterol   . Anxiety     Past Surgical History  Procedure Laterality Date  . Eye implants      1999  . Back surgery      1995  . Vaginal hysterectomy      1995  . Aural atresia repair      1983    There were no vitals taken for this visit.  Visit Diagnosis:  Abnormality of gait      Subjective Assessment - 04/18/14 1322    Symptoms No falls since evalutaion.   Currently in Pain? No/denies            [facility] Adult PT Treatment/Exercise - 04/18/14 1318    High Level Balance   High Level Balance Activities Side stepping;Braiding;Backward walking;Tandem walking;Marching forwards;Marching backwards  alternate kicks: forward, abduction, extension   Knee/Hip Exercises: Aerobic   Stationary Bike 10 min Level 5 NuStep   Balance Exercises   Sidestepping Other reps (comment)  10X   Tandem Walking 3 round trips  at counter   Retro Gait Other reps (comment)  10 reps at counter marching          PT Education - 04/18/14 1500    Education provided Yes   Education Details HEP for balance (see patient instructions), Fall prevention strategies   Person(s) Educated Patient;Child(ren)   Methods Explanation;Demonstration;Handout   Comprehension Verbalized understanding;Returned demonstration;Need further instruction          PT Short Term Goals - 04/18/14 1625    PT SHORT TERM GOAL #1   Title  Verbalize understanding of HEP with minimal cueing for accuracy with son's assist. (Target Date: 04/24/14)   Status On-going   PT SHORT TERM GOAL #2   Title Increase Berg Balance score to >/=53/56  (Target Date: 04/24/14)   Status On-going   PT SHORT TERM GOAL #3   Title demonstrate ability to turn 180 degrees safely and independently to decrease risk of falls.  (Target Date: 04/24/14)   Status On-going   PT SHORT TERM GOAL #4   Title Improve TUG speed to </= 13.0 seconds to decrease risk of falls.  (Target Date: 04/24/14)   Status On-going   PT SHORT TERM GOAL #5   Title Patient will ambulate 1000' on even /uneven terrain indpendent to safely increase activity tolerance.  (Target Date: 04/24/14)   Status On-going          PT Long Term Goals - 04/18/14 1629    PT LONG TERM GOAL #1   Title verbalize /demonstrate understanding of fall prevention strategies within home environment with family. (Target Date: 05/24/14)   Status On-going   PT LONG TERM GOAL #2   Title Independent of PT cueing , accurately demonstrate HEP with family. (Target Date: 05/24/14)   Status On-going   PT LONG TERM  GOAL #3   Title Self-report via FOTO improvement >10 points in Functional Status Measure. (Target Date: 05/24/14)   Status On-going          Plan - 04/18/14 1620    Clinical Impression Statement This 80yo female with Alzheimer's was able to follow directions for HEP for balance while her son observed. He verbalized general understanding to work on exercises at home safely.   Pt will benefit from skilled therapeutic intervention in order to improve on the following deficits Abnormal gait;Decreased activity tolerance;Decreased balance;Decreased endurance;Decreased mobility;Decreased safety awareness;Decreased strength   Rehab Potential Good   Clinical Impairments Affecting Rehab Potential Alzheimer's disease   PT Frequency 1x / week   PT Duration 8 weeks   PT Treatment/Interventions ADLs/Self Care  Home Management;Gait training;Stair training;Functional mobility training;Balance training;Therapeutic exercise;Therapeutic activities;Neuromuscular re-education   PT Next Visit Plan  Review HEP, progress balance, gait with scanning & simple cognitive tasks   Consulted and Agree with Plan of Care Patient;Family member/caregiver        Problem List Patient Active Problem List   Diagnosis Date Noted  . Dementia of the Alzheimer's type without behavioral disturbance 10/11/2013  . Gait instability 10/11/2013  . Memory loss 09/06/2012  . Benign paroxysmal positional vertigo 09/06/2012  . High cholesterol   . Anxiety       [name], PT, DPT Physical Therapist Specializing in Prosthetics & Limb Loss Care Phone: [phone] FAX: [phone] [address] [place], [place] [address]   [name] 04/18/2014, 4:36 PM

## 2014-04-25 ENCOUNTER — Ambulatory Visit: Payer: Medicare PPO | Attending: Internal Medicine | Admitting: Physical Therapy

## 2014-04-25 ENCOUNTER — Encounter: Payer: Self-pay | Admitting: Physical Therapy

## 2014-04-25 DIAGNOSIS — E780 Pure hypercholesterolemia: Secondary | ICD-10-CM | POA: Insufficient documentation

## 2014-04-25 DIAGNOSIS — Z5189 Encounter for other specified aftercare: Secondary | ICD-10-CM | POA: Insufficient documentation

## 2014-04-25 DIAGNOSIS — R269 Unspecified abnormalities of gait and mobility: Secondary | ICD-10-CM | POA: Diagnosis not present

## 2014-04-25 DIAGNOSIS — F0280 Dementia in other diseases classified elsewhere without behavioral disturbance: Secondary | ICD-10-CM | POA: Insufficient documentation

## 2014-04-25 DIAGNOSIS — H8110 Benign paroxysmal vertigo, unspecified ear: Secondary | ICD-10-CM | POA: Insufficient documentation

## 2014-04-25 DIAGNOSIS — I10 Essential (primary) hypertension: Secondary | ICD-10-CM | POA: Insufficient documentation

## 2014-04-25 DIAGNOSIS — G309 Alzheimer's disease, unspecified: Secondary | ICD-10-CM | POA: Insufficient documentation

## 2014-04-25 NOTE — Therapy (Signed)
[facility] [address] [place], [place], [address] Phone: [phone]   Fax:  [phone]  Physical Therapy Treatment  Patient Details  Name: Kelly Cross MRN: [id number] Date of Birth: [birth year]  Encounter Date: 04/25/2014      PT End of Session - 04/25/14 1359    Visit Number 3   Number of Visits 9   Date for PT Re-Evaluation 05/24/14   Authorization Type [employer]   Authorization Time Period 03/31/2014-05/15/2014   Authorization - Visit Number 3   Authorization - Number of Visits 6   PT Start Time 1317   PT Stop Time 1358   PT Time Calculation (min) 41 min   Equipment Utilized During Treatment Gait belt   Activity Tolerance Patient tolerated treatment well   Behavior During Therapy [facility] for tasks assessed/performed      Past Medical History  Diagnosis Date  . High blood pressure   . High cholesterol   . Anxiety     Past Surgical History  Procedure Laterality Date  . Eye implants      1999  . Back surgery      1995  . Vaginal hysterectomy      1995  . Aural atresia repair      1983    There were no vitals taken for this visit.  Visit Diagnosis:  Abnormality of gait      Subjective Assessment - 04/25/14 1320    Symptoms No new complaints. No falls or pain to report. Per son has not had chance to do HEP yet due to busy holiday.   Currently in Pain? No/denies            [facility] Adult PT Treatment/Exercise - 04/25/14 1321    Ambulation/Gait   Ambulation/Gait Yes   Ambulation/Gait Assistance 4: Min guard;4: Min assist   Ambulation/Gait Assistance Details cues on posture and to increase step length and BOS with gait.   Ambulation Distance (Feet) 60 Feet  x2   Assistive device None   Gait Pattern Step-through pattern;Decreased stride length;Shuffle;Trunk flexed;Narrow base of support   Gait velocity decreased   Dynamic Standing Balance   Dynamic Standing - Balance Support No upper extremity  supported;During functional activity   Dynamic Standing - Level of Assistance 4: Min assist;3: Mod assist   Dynamic Standing - Balance Activities Alternating  foot traps   Dynamic Standing - Comments with tall cones: fwd taps, cross taps. fwd double taps, cross double taps, flip over/up x10 each bil legs.                        High Level Balance   High Level Balance Activities Side stepping;Braiding;Backward walking;Tandem walking;Marching forwards;Marching backwards;Figure 8 turns  crossovers, toe/heel walk forward/backwards   High Level Balance Comments UE assist on counter, plus min guard to min assist for balance. cues on posture and correct form with activites.   Knee/Hip Exercises: Aerobic   Stationary Bike Scifit x4 extremeties L1.5 x8 minutes with goal RPM >/= 65 for strengthening and activity tolerance.                  PT Short Term Goals - 04/25/14 1636    PT SHORT TERM GOAL #1   Title Verbalize understanding of HEP with minimal cueing for accuracy with son's assist. (Target Date: 04/24/14)   Status Achieved   PT SHORT TERM GOAL #2   Title Increase Berg Balance score to >/=53/56  (Target  Date: 04/24/14)   Status On-going   PT SHORT TERM GOAL #3   Title demonstrate ability to turn 180 degrees safely and independently to decrease risk of falls.  (Target Date: 04/24/14)   Status Achieved   PT SHORT TERM GOAL #4   Title Improve TUG speed to </= 13.0 seconds to decrease risk of falls.  (Target Date: 04/24/14)   Status On-going   PT SHORT TERM GOAL #5   Title Patient will ambulate 1000' on even /uneven terrain indpendent to safely increase activity tolerance.  (Target Date: 04/24/14)   Status On-going          PT Long Term Goals - 04/25/14 1637    PT LONG TERM GOAL #1   Title verbalize /demonstrate understanding of fall prevention strategies within home environment with family. (Target Date: 05/24/14)   Status On-going   PT LONG TERM GOAL #2   Title Independent of PT  cueing , accurately demonstrate HEP with family. (Target Date: 05/24/14)   Status On-going   PT LONG TERM GOAL #3   Title Self-report via FOTO improvement >10 points in Functional Status Measure. (Target Date: 05/24/14)   Status On-going          Plan - 04/25/14 1633    Clinical Impression Statement Pt making steady progress toward goals. Needed minimal cues with current HEP today on posture and ex form.    Pt will benefit from skilled therapeutic intervention in order to improve on the following deficits Abnormal gait;Decreased activity tolerance;Decreased balance;Decreased endurance;Decreased mobility;Decreased safety awareness;Decreased strength   Rehab Potential Good   Clinical Impairments Affecting Rehab Potential Alzheimer's disease   PT Frequency 1x / week   PT Duration 8 weeks   PT Treatment/Interventions ADLs/Self Care Home Management;Gait training;Stair training;Functional mobility training;Balance training;Therapeutic exercise;Therapeutic activities;Neuromuscular re-education   PT Next Visit Plan Assess remaining STG's. Continue with gait and balance activities   PT Home Exercise Plan Pt/son to perform HEP between now and next visit and bring in any questions   Consulted and Agree with Plan of Care Patient;Family member/caregiver   Family Member Consulted son         Problem List Patient Active Problem List   Diagnosis Date Noted  . Dementia of the Alzheimer's type without behavioral disturbance 10/11/2013  . Gait instability 10/11/2013  . Memory loss 09/06/2012  . Benign paroxysmal positional vertigo 09/06/2012  . High cholesterol   . Anxiety     Sallyanne Kuster 04/25/2014, 4:38 PM  Sallyanne Kuster, PTA, [facility] [address] [place], [place] [address] [phone] 04/25/2014, 4:39 PM

## 2014-05-02 ENCOUNTER — Ambulatory Visit: Payer: Medicare PPO | Admitting: Physical Therapy

## 2014-05-02 ENCOUNTER — Encounter: Payer: Self-pay | Admitting: Physical Therapy

## 2014-05-02 DIAGNOSIS — Z5189 Encounter for other specified aftercare: Secondary | ICD-10-CM | POA: Diagnosis not present

## 2014-05-02 DIAGNOSIS — R269 Unspecified abnormalities of gait and mobility: Secondary | ICD-10-CM

## 2014-05-03 NOTE — Therapy (Signed)
[facility] [address] [place], [place], [address] Phone: [phone]   Fax:  [phone]  Physical Therapy Treatment  Patient Details  Name: Kelly Cross MRN: [id number] Date of Birth: 1926-07-16  Encounter Date: 05/02/2014      PT End of Session - 05/02/14 1308    Visit Number 4   Number of Visits 9   Date for PT Re-Evaluation 05/24/14   Authorization Type [employer]   Authorization Time Period 03/31/2014-05/15/2014   Authorization - Visit Number 4   Authorization - Number of Visits 9   PT Start Time 1324   PT Stop Time 1313   PT Time Calculation (min) 39 min   Equipment Utilized During Treatment Gait belt   Activity Tolerance Patient tolerated treatment well   Behavior During Therapy [facility] for tasks assessed/performed      Past Medical History  Diagnosis Date  . High blood pressure   . High cholesterol   . Anxiety     Past Surgical History  Procedure Laterality Date  . Eye implants      1999  . Back surgery      1995  . Vaginal hysterectomy      1995  . Aural atresia repair      1983    There were no vitals taken for this visit.  Visit Diagnosis:  Abnormality of gait      Subjective Assessment - 05/02/14 1238    Symptoms No new compaints. Reports no falls or pain. Son reports it's very difficult to get her to do HEP, she doesn't like it as she has never had to "work out" before.   Currently in Pain? No/denies       05/02/14 0001  Ambulation/Gait  Ambulation/Gait Yes  Ambulation/Gait Assistance 4: Min guard;5: Supervision  Ambulation/Gait Assistance Details occasional cues on posture and to increase stride length and foot clearance with swing phase.                               Ambulation Distance (Feet) 1000 Feet  Assistive device None  Gait Pattern Step-through pattern;Narrow base of support;Poor foot clearance - left;Poor foot clearance - right  Gait velocity decreased  Dynamic Standing  Balance  Dynamic Standing - Balance Support No upper extremity supported;During functional activity  Dynamic Standing - Level of Assistance 4: Min assist;3: Mod assist  Dynamic Standing - Balance Activities Alternating  foot traps  Dynamic Standing - Comments with tall cones: fwd taps, cross taps, fwd double taps, cross double taps, and flip over/up x10 each bil legs.                            Timed Up and Go Test  TUG Normal TUG  Normal TUG (seconds) 11.06 (without AD and supervision)  High Level Balance  High Level Balance Activities Side stepping;Marching forwards;Marching backwards (heel/toe walk forward/backwards)  High Level Balance Comments on blue mat with occasional UE support on counter/min assist: x3 laps each/each way with cues on posture and ex form                          Knee/Hip Exercises: Aerobic  Stationary Bike Scifit x4 extremeties L1.5 x8 minutes with goal RPM >/= 55 for strengthening and activity tolerance.       (decreased RPM goal this session due to pt  reported fatigue)             PT Short Term Goals - 05/02/14 1310    PT SHORT TERM GOAL #1   Title Verbalize understanding of HEP with minimal cueing for accuracy with son's assist. (Target Date: 04/24/14)   Status Achieved   PT SHORT TERM GOAL #2   Title Increase Berg Balance score to >/=53/56  (Target Date: 04/24/14)   Status On-going   PT SHORT TERM GOAL #3   Title demonstrate ability to turn 180 degrees safely and independently to decrease risk of falls.  (Target Date: 04/24/14)   Status Achieved   PT SHORT TERM GOAL #4   Title Improve TUG speed to </= 13.0 seconds to decrease risk of falls.  (Target Date: 04/24/14)   Status Achieved  11.06 with cane   PT SHORT TERM GOAL #5   Title Patient will ambulate 1000' on even /uneven terrain indpendent to safely increase activity tolerance.  (Target Date: 04/24/14)   Status Achieved  on 05/02/2014          PT Long Term Goals - 05/02/14 1310    PT  LONG TERM GOAL #1   Title verbalize /demonstrate understanding of fall prevention strategies within home environment with family. (Target Date: 05/24/14)   Status On-going   PT LONG TERM GOAL #2   Title Independent of PT cueing , accurately demonstrate HEP with family. (Target Date: 05/24/14)   Status On-going   PT LONG TERM GOAL #3   Title Self-report via FOTO improvement >10 points in Functional Status Measure. (Target Date: 05/24/14)   Status On-going          Plan - 05/02/14 1309    Clinical Impression Statement Pt continues to make progress with remaining STG's met today. Advance dynamic gait activities to on compliant surface for additional balance challenge without pt complaints.   Pt will benefit from skilled therapeutic intervention in order to improve on the following deficits Abnormal gait;Decreased activity tolerance;Decreased balance;Decreased endurance;Decreased mobility;Decreased safety awareness;Decreased strength   Rehab Potential Good   Clinical Impairments Affecting Rehab Potential Alzheimer's disease   PT Frequency 1x / week   PT Duration 8 weeks   PT Treatment/Interventions ADLs/Self Care Home Management;Gait training;Stair training;Functional mobility training;Balance training;Therapeutic exercise;Therapeutic activities;Neuromuscular re-education   PT Next Visit Plan  Continue with gait and balance activities toward LTG's   PT Home Exercise Plan Reinforced with pt/son importance of HEP complaince    Consulted and Agree with Plan of Care Patient;Family member/caregiver   Family Member Consulted son        Problem List Patient Active Problem List   Diagnosis Date Noted  . Dementia of the Alzheimer's type without behavioral disturbance 10/11/2013  . Gait instability 10/11/2013  . Memory loss 09/06/2012  . Benign paroxysmal positional vertigo 09/06/2012  . High cholesterol   . Anxiety     Willow Ora 05/03/2014, 2:16 PM  Willow Ora, PTA, [facility] [address], [place], [place] [address] [phone] 05/03/2014, 2:16 PM

## 2014-05-09 ENCOUNTER — Ambulatory Visit: Payer: Medicare PPO

## 2014-05-09 DIAGNOSIS — R269 Unspecified abnormalities of gait and mobility: Secondary | ICD-10-CM

## 2014-05-09 DIAGNOSIS — Z5189 Encounter for other specified aftercare: Secondary | ICD-10-CM | POA: Diagnosis not present

## 2014-05-09 NOTE — Therapy (Addendum)
[facility]: [facility] [address] [place], [place], [address] Phone: [phone]   Fax:  [phone]  Physical Therapy Treatment  Patient Details  Name: Kelly Cross MRN: [id number] Date of Birth: 1926-09-19  Encounter Date: 05/09/2014      PT End of Session - 05/09/14 1426    Visit Number 5   Number of Visits 9   Date for PT Re-Evaluation 05/24/14   Authorization Type [employer]   Authorization Time Period 03/31/2014-05/15/2014   Authorization - Visit Number 5   Authorization - Number of Visits 6   PT Start Time 1315   PT Stop Time 1359   PT Time Calculation (min) 44 min   Equipment Utilized During Treatment Gait belt   Activity Tolerance Patient tolerated treatment well   Behavior [facility] for tasks assessed/performed      Past Medical History  Diagnosis Date  . High blood pressure   . High cholesterol   . Anxiety     Past Surgical History  Procedure Laterality Date  . Eye implants      1999  . Back surgery      1995  . Vaginal hysterectomy      1995  . Aural atresia repair      1983    There were no vitals taken for this visit.  Visit Diagnosis:  Abnormality of gait      Subjective Assessment - 05/09/14 1318    Symptoms Pt denied any falls or changes since last visit. Pt reported HEP has been getting easier.   Currently in Pain? No/denies            [facility] Treatment/Exercise - 05/09/14 1330    Ambulation/Gait   Ambulation/Gait Yes   Ambulation/Gait Assistance 4: Min guard;5: Supervision   Ambulation/Gait Assistance Details Pt ambulated over even/uneven terrain while performing head turns and 180 degree turns. VC's to improve narrow BOS during turns, and to improve heel strike and upright posture during ambulation over uneven terrain.   Ambulation Distance (Feet) --  230', 8x7', 100'x2, 550'   Assistive device None   Gait Pattern Step-through pattern;Narrow base of  support;Decreased dorsiflexion - right;Decreased dorsiflexion - left   Gait velocity decreased   Ramp Other (comment)  x2; min guard to ensure safety   Curb 5: Supervision  to    Curb Details (indicate cue type and reason) min guard to supervision during ramp/curb to ensure safety. VC's to improve weight shifting ant/post. during ascend/descending ramp.   Balance   Balance Assessed Yes   Dynamic Standing Balance   Dynamic Standing - Balance Support No upper extremity supported   Dynamic Standing - Level of Assistance Other (comment)  min guard   Dynamic Standing - Balance Activities Alternating  foot traps   Dynamic Standing - Comments Tossing bean bags into basket, after picking up bags from floor. Braiding over grassy terrain, cone taps with tall cones over compliant surfaces 2x10 B LE; forward/backward/sidestepping over compliant surface with and without bean bags under mat (2x7'/direction). VC's to improve weight shifting and heel strike during ambulation.           PT Education - 05/09/14 1425    Education provided Yes   Education Details Encouraged pt and pt's son to perform HEP 3x/week   Person(s) Educated Patient;Child(ren)   Methods Explanation   Comprehension Verbalized understanding          PT Short Term Goals - 05/09/14 1428    PT  SHORT TERM GOAL #1   Title Verbalize understanding of HEP with minimal cueing for accuracy with son's assist. (Target Date: 04/24/14)   Status Achieved   PT SHORT TERM GOAL #2   Title Increase Berg Balance score to >/=53/56  (Target Date: 04/24/14)   Status On-going   PT SHORT TERM GOAL #3   Title demonstrate ability to turn 180 degrees safely and independently to decrease risk of falls.  (Target Date: 04/24/14)   Status Achieved   PT SHORT TERM GOAL #4   Title Improve TUG speed to </= 13.0 seconds to decrease risk of falls.  (Target Date: 04/24/14)   Status Achieved  11.06 with cane   PT SHORT TERM GOAL #5   Title Patient will  ambulate 1000' on even /uneven terrain indpendent to safely increase activity tolerance.  (Target Date: 04/24/14)   Status Achieved  on 05/02/2014          PT Long Term Goals - 05/09/14 1428    PT LONG TERM GOAL #1   Title verbalize /demonstrate understanding of fall prevention strategies within home environment with family. (Target Date: 05/24/14)   Status On-going   PT LONG TERM GOAL #2   Title Independent of PT cueing , accurately demonstrate HEP with family. (Target Date: 05/24/14)   Status On-going   PT LONG TERM GOAL #3   Title Self-report via FOTO improvement >10 points in Functional Status Measure. (Target Date: 05/24/14)   Status On-going          Plan - 05/09/14 1426    Clinical Impression Statement Pt demonstrated progress as she was able to perform dynamic gait and balance activities with less assist, over uneven and compliant surfaces. Pt would continue to benefit from skilled therapy to improve safety during functional mobility.   Pt will benefit from skilled therapeutic intervention in order to improve on the following deficits Abnormal gait;Decreased activity tolerance;Decreased balance;Decreased endurance;Decreased mobility;Decreased safety awareness;Decreased strength   Rehab Potential Good   PT Frequency 1x / week   PT Duration 8 weeks   PT Treatment/Interventions ADLs/Self Care Home Management;Gait training;Stair training;Functional mobility training;Balance training;Therapeutic exercise;Therapeutic activities;Neuromuscular re-education   PT Next Visit Plan G-code; D/C and assess LTGs.   Consulted and Agree with Plan of Care Patient;Family member/caregiver   Family Member Consulted son                               Problem List Patient Active Problem List   Diagnosis Date Noted  . Dementia of the Alzheimer's type without behavioral disturbance 10/11/2013  . Gait instability 10/11/2013  . Memory loss 09/06/2012  . Benign paroxysmal  positional vertigo 09/06/2012  . High cholesterol   . Anxiety     Miller,Jennifer L 05/09/2014, 2:39 PM      Zerita BoersJennifer Miller, PT,DPT 05/09/2014 2:39 PM Phone: [phone] Fax: [phone]

## 2014-05-16 ENCOUNTER — Telehealth: Payer: Self-pay

## 2014-05-16 ENCOUNTER — Ambulatory Visit: Payer: Medicare PPO

## 2014-05-16 NOTE — Therapy (Signed)
[facility] [address] [place], [place], [address] Phone: [phone]   Fax:  [phone]  Patient Details  Name: Kelly Cross MRN: [id number] Date of Birth: June 03, 1926  Encounter Date: 05/16/2014  PHYSICAL THERAPY DISCHARGE SUMMARY  Visits from Start of Care: 5  Current functional level related to goals / functional outcomes: PT SHORT TERM GOAL #1     Title  Verbalize understanding of HEP with minimal cueing for accuracy with son's assist. (Target Date: 04/24/14)    Status  Achieved    PT SHORT TERM GOAL #2    Title  Increase Berg Balance score to >/=53/56 (Target Date: 04/24/14)    Status  On-going    PT SHORT TERM GOAL #3    Title  demonstrate ability to turn 180 degrees safely and independently to decrease risk of falls. (Target Date: 04/24/14)    Status  Achieved    PT SHORT TERM GOAL #4    Title  Improve TUG speed to </= 13.0 seconds to decrease risk of falls. (Target Date: 04/24/14)    Status  Achieved      11.06 with cane    PT SHORT TERM GOAL #5    Title  Patient will ambulate 1000' on even /uneven terrain indpendent to safely increase activity tolerance. (Target Date: 04/24/14)    Status  Achieved      on 05/02/2014    PT LONG TERM GOAL #1    Title  verbalize /demonstrate understanding of fall prevention strategies within home environment with family. (Target Date: 05/24/14)    Status  On-going    PT LONG TERM GOAL #2    Title  Independent of PT cueing , accurately demonstrate HEP with family. (Target Date: 12/ 30/15)    Status  On-going    PT LONG TERM GOAL #3    Title  Self-report via FOTO improvement >10 points in Functional Status Measure. (Target Date: 12/30/ 15)    Status  On-going         Remaining deficits: Pt continues to have a difficult time performing HEP at home due to cognitive impairments. PT reiterated the  importance of performing HEP to pt's son. It is unknown if pt met LTGs and remaining BERG STG, as pt was unable to come to last PT appointment on 05/16/14 due to it being outside the insurance approved dates (03/31/14-05/15/14).   Education / Equipment: HEP Plan: Patient agrees to discharge.  Patient goals were partially met. Patient is being discharged due to meeting the stated rehab goals.  ?????  Patient is being discharged from PT, as she has reached expected maximal functional gains and her insurance [facility]) has only approved 6 total visits between 03/31/14-05/15/14. Please see last PT treatment note, regarding patients gains. Pt was unable to keep last appointment on 05/16/14, due to it being outside of the insurance approval and pt did not want to pay out of pocket.            Syncere Kaminski L 05/16/2014, 1:49 PM  [facility] [address] [place], [place], [address] Phone: [phone]   Fax:  [phone]   Geoffry Paradise, PT,DPT 05/16/2014 1:50 PM Phone: [phone] Fax: [phone]

## 2014-05-16 NOTE — Telephone Encounter (Signed)
PT left message for pt regarding 05/16/14 appt. Pt's [employer] coverage ended on 05/15/14, so today's visit or any following visits would not be covered. If pt calls back we need to cancel appt. Today was to be pt's last appt. For PT, as she has reached her functional mobility potential.

## 2014-05-23 ENCOUNTER — Ambulatory Visit: Payer: Medicare PPO

## 2014-05-30 ENCOUNTER — Ambulatory Visit: Payer: Medicare PPO

## 2014-06-06 ENCOUNTER — Ambulatory Visit: Payer: Medicare PPO

## 2014-09-13 DIAGNOSIS — Z0289 Encounter for other administrative examinations: Secondary | ICD-10-CM

## 2014-12-04 ENCOUNTER — Ambulatory Visit (INDEPENDENT_AMBULATORY_CARE_PROVIDER_SITE_OTHER): Payer: Medicare PPO | Admitting: Neurology

## 2014-12-04 ENCOUNTER — Encounter: Payer: Self-pay | Admitting: Neurology

## 2014-12-04 VITALS — BP 162/72 | HR 61 | Ht 62.0 in | Wt 116.5 lb

## 2014-12-04 DIAGNOSIS — F0280 Dementia in other diseases classified elsewhere without behavioral disturbance: Secondary | ICD-10-CM

## 2014-12-04 DIAGNOSIS — G309 Alzheimer's disease, unspecified: Secondary | ICD-10-CM

## 2014-12-04 DIAGNOSIS — F419 Anxiety disorder, unspecified: Secondary | ICD-10-CM | POA: Diagnosis not present

## 2014-12-04 DIAGNOSIS — R2681 Unsteadiness on feet: Secondary | ICD-10-CM | POA: Diagnosis not present

## 2014-12-04 NOTE — Progress Notes (Addendum)
PATIENT: Kelly Cross DOB: 10/17/1926  Chief Complaint  Patient presents with  . Memory Loss    MMSE 23/30 - 7 animals.  She is here with her son, Kelly Cross, who feels her memory has worsened since last seen.  She has also had a couple of falls due to an unsteady gait.    HISTORICAL  Kelly Cross   HISTORY OF PRESENT ILLNESS: Mrs. Kelly Cross is a 81 years old right-handed African American female, accompanied by her son, referred by her primary care physician for evaluation of memory loss.   She has past medical history of pulmonary sarcoidosis, was treated with steroid more than 40 years ago, also history of hypertension, anxiety, bilateral cataract surgery, she had college degree, taught sixth-grade transiently, was a homemaker,, but very actively involved in church, community service, her son stated that she really the captain of her family, has been taking care of her sick husband, who suffered Alzheimer's disease, her son who has schizophrenia, was dedicated.   She was noted to get frustrated easily over the past one year, which is not her normal self, also began to make mistakes, she forgets to take her medications sometimes, but she is still very active at home, she is supervising her son, who suffers schizophrenia, she is his main caregiver, supervising his medications, she is driving only short distance, but even that she got lost sometimes, she still pay home bills, but her son began to step in to help her She denies significant visual loss, no lateralized motor or sensory deficit, her son provide most of the story   MRI of the brain showed mild atrophy, laboratory evaluation was normal, including B12.  Son reported, she was highly functional, now struggling with her daily activity, no longer driving, she also has hard of hearing   UPDATE Sept 18th 2014:  She is tolerating Namenda xr 28 mg every day, there was no significant side effect, but she has increased anxiety  dealing with her son who suffered mental illness, she has not taking Zoloft 100 mg, Lunesta 2 mg every night, she complains some morning drowsiness, mild gait difficulty.  UPDATE 10/11/13 (LL):  Patient comes in with son today who requested sooner revisit.  Son feels mother's health declining very quickly. He believes she needs 24 hour nursing care in the home, and he cannot be there at all times due to work.  He has looking in to having a nurse aide come in to the home, but having a schizophrenic brother in the home has made some agencies decline. Tolerating Namenda XR and Aricept, but having difficulty sleeping at night.  She is still independent with bathing, dressing, feeding, having urinary incontinence.  Needs help with all IADLs. Son states mother has long-term care insurance plan but he really wants to keep her at home if at all possible, wants to keep her safe.  UPDATE Feb 13 2014: She continue decline with her memory, increased confusion, also mild unsteady gait, complains of frequent low back pain, taking Tylenol almost every night to sleep, she used to take gabapentin 300 mg twice a day for restless leg syndrome, was not sure it has been helpful, she is taking Namenda, and Aricept.  She still takes care of her younger son, who suffered  Schizophrenic.  UPDATE December 04 2014: She is with her son Kelly Cross at today's visit  She is taking Namazaric, tolerating it well, her son son is concerned about her increase the memory trouble, she  has more trouble handling daily activity, such as cooking, keep up with her medications, she put Vaseline inside her mouth for her dry mouth, she is very hesitant to accept home help, which is well covered under her long-term care  REVIEW OF SYSTEMS: Full 14 system review of systems performed and notable only for appetite change, chills, hearing loss, memory loss, depression anxiety  ALLERGIES: No Known Allergies  HOME MEDICATIONS: Current Outpatient  Prescriptions  Medication Sig Dispense Refill  . Acetaminophen (TYLENOL EXTRA STRENGTH PO) Take 500 mg by mouth 2 (two) times daily.    . BELSOMRA 10 MG TABS     . CALCIUM ASPARTATE PO Take by mouth daily.    . hydrochlorothiazide (HYDRODIURIL) 25 MG tablet Take 25 mg by mouth daily.    . Memantine HCl-Donepezil HCl (NAMZARIC) 28-10 MG CP24 Take 1 tablet by mouth daily. 30 capsule 11  . Multiple Vitamins-Minerals (CENTRUM SILVER PO) Take by mouth daily.    Marland Kitchen neomycin-polymyxin b-dexamethasone (MAXITROL) 3.5-10000-0.1 OINT     . omeprazole (PRILOSEC) 20 MG capsule Take 20 mg by mouth daily.    . sertraline (ZOLOFT) 100 MG tablet TK 1 T PO ONCE A DAY  12   No current facility-administered medications for this visit.    PAST MEDICAL HISTORY: Past Medical History  Diagnosis Date  . High blood pressure   . High cholesterol   . Anxiety     PAST SURGICAL HISTORY: Past Surgical History  Procedure Laterality Date  . Eye implants      1999  . Back surgery      1995  . Vaginal hysterectomy      1995  . Aural atresia repair      1983    FAMILY HISTORY: Family History  Problem Relation Age of Onset  . Diabetes Brother   . Leukemia Sister     SOCIAL HISTORY:  History   Social History  . Marital Status: Widowed    Spouse Name: N/A  . Number of Children: 3  . Years of Education: N/A   Occupational History  . Not on file.   Social History Main Topics  . Smoking status: Never Smoker   . Smokeless tobacco: Never Used  . Alcohol Use: No  . Drug Use: No  . Sexual Activity: Not on file   Other Topics Concern  . Not on file   Social History Narrative     PHYSICAL EXAM   Filed Vitals:   12/04/14 0948  BP: 162/72  Pulse: 61  Height: 5\' 2"  (1.575 m)  Weight: 116 lb 8 oz (52.844 kg)    Not recorded      Body mass index is 21.3 kg/(m^2).  PHYSICAL EXAMNIATION:  Gen: NAD, conversant, well nourised, obese, well groomed                     Cardiovascular:  Regular rate rhythm, no peripheral edema, warm, nontender. Eyes: Conjunctivae clear without exudates or hemorrhage Neck: Supple, no carotid bruise. Pulmonary: Clear to auscultation bilaterally   NEUROLOGICAL EXAM:  MENTAL STATUS: Speech:    Speech is normal; fluent and spontaneous with normal comprehension.  Cognition:   Mini-Mental Status Examination is 23 out of 30, she is not oriented to clinic, missed one out of 3 recalls, has difficulty with attention, difficulty copy design. Animal naming is 7  CRANIAL NERVES: CN II: Visual fields are full to confrontation. Pupil were equal round reactive to light. CN III, IV, VI: extraocular  movement are normal. No ptosis. CN V: Facial sensation is intact to pinprick in all 3 divisions bilaterally. Corneal responses are intact.  CN VII: Face is symmetric with normal eye closure and smile. CN VIII: Hearing is normal to rubbing fingers CN IX, X: Palate elevates symmetrically. Phonation is normal. CN XI: Head turning and shoulder shrug are intact CN XII: Tongue is midline with normal movements and no atrophy.  MOTOR: She has mild to moderate left ankle dorsiflexion weakness,  REFLEXES: Reflexes are 1 and symmetric at the biceps, triceps, knees, and absent at ankles. Plantar responses are flexor.  SENSORY: Light touch, pinprick, position sense, and vibration sense are intact in fingers and toes.  COORDINATION: Rapid alternating movements and fine finger movements are intact. There is no dysmetria on finger-to-nose and heel-knee-shin. There are no abnormal or extraneous movements.   GAIT/STANCE: Need to push up to get up from seated position, left foot drop, wide-based, mildly unsteady gait  DIAGNOSTIC DATA (LABS, IMAGING, TESTING) - I reviewed patient records, labs, notes, testing and imaging myself where available.  Lab Results  Component Value Date   WBC 5.9 09/06/2012   HGB 12.3 09/06/2012   HCT 38.2 09/06/2012   MCV 99* 09/06/2012    PLT 214 09/06/2012      Component Value Date/Time   NA 142 09/06/2012 1122   K 4.1 09/06/2012 1122   CL 103 09/06/2012 1122   CO2 28 09/06/2012 1122   GLUCOSE 84 09/06/2012 1122   BUN 14 09/06/2012 1122   CREATININE 1.01* 09/06/2012 1122   CALCIUM 9.6 09/06/2012 1122   PROT 6.9 09/06/2012 1122   AST 33 09/06/2012 1122   ALT 23 09/06/2012 1122   ALKPHOS 90 09/06/2012 1122   BILITOT 0.3 09/06/2012 1122   GFRNONAA 52* 09/06/2012 1122   GFRAA 59* 09/06/2012 1122   No results found for: CHOL, HDL, LDLCALC, LDLDIRECT, TRIG, CHOLHDL No results found for: IEPP2R Lab Results  Component Value Date   VITAMINB12 1074* 09/06/2012   Lab Results  Component Value Date   TSH 0.542 09/06/2012      ASSESSMENT AND PLAN  Haide Ormsby Fecteau is a 81 y.o. female    1. Dementia, probable Alzheimer's dementia, Mini-Mental Status Examination is 23 out of 30, is tolerating Namzaric daily well 2. Gait difficulty, low back pain, left ankle dorsiflexion weakness, most consistent with lumbar stenosis, continue moderate exercise, 3, return to clinic in 3 months with nurse practitioner  Levert Feinstein, M.D. Ph.D.  [facility] [address], [address] [place], [place] [address] Ph: [phone] Fax: [phone]

## 2014-12-06 ENCOUNTER — Telehealth: Payer: Self-pay

## 2014-12-06 NOTE — Telephone Encounter (Signed)
I spoke to the patient's son, [name], about the CREAD research study. The patient and [name] are very interested in participating. Burton Apleygden requested more information via email. I will email him the Informed Consent Form (ICF) as requested.

## 2014-12-06 NOTE — Telephone Encounter (Signed)
I left a message for the patient to return my call.

## 2014-12-07 ENCOUNTER — Ambulatory Visit: Payer: Medicare PPO | Admitting: Neurology

## 2014-12-13 ENCOUNTER — Telehealth: Payer: Self-pay | Admitting: Neurology

## 2014-12-13 NOTE — Telephone Encounter (Signed)
I called Kelly Cross yesterday and today at both her home and cellular phone numbers (that are listed on EPIC).  I left messages on both of those numbers for her to contact me so that I may schedule her for the CREAD study.  I will call again tomorrow.

## 2014-12-15 ENCOUNTER — Telehealth: Payer: Self-pay | Admitting: Neurology

## 2014-12-15 ENCOUNTER — Ambulatory Visit: Payer: Medicare PPO | Admitting: Neurology

## 2014-12-15 NOTE — Telephone Encounter (Signed)
I called Kelly Cross about enrolling her in the CREAD study.  I left messages on both her home and cellular numbers (in EPIC).  In each phone call (past and current), I left my contact information.

## 2014-12-18 ENCOUNTER — Encounter: Payer: Self-pay | Admitting: Neurology

## 2014-12-19 ENCOUNTER — Telehealth: Payer: Self-pay

## 2014-12-19 NOTE — Telephone Encounter (Signed)
I left a message for the patient to return my call.

## 2014-12-25 ENCOUNTER — Telehealth: Payer: Self-pay | Admitting: Neurology

## 2014-12-25 NOTE — Telephone Encounter (Signed)
I called Kelly Cross and left her a message about contacting me (with regards to the CREAD study).  I left a message on her home telephone number.  I called the [phone] however, the mailbox was full and I was unable to leave a message.  I will continue to call her.

## 2014-12-25 NOTE — Telephone Encounter (Signed)
I received a telephone call from San Jetty Eshelman's son).  He explained that he has had some family issues come up that have prevented him from being able to call the Research [employer] back.  He also said that he came up to the hospital to try to talk to someone from the Research Department.  He was told that a message would be relayed to research for [place] to call him back but it was not.  I will follow up with Carollee Herter (front desk) about this.  The patient is scheduled for August 4th, 2016 at 12pm.

## 2014-12-28 ENCOUNTER — Encounter (INDEPENDENT_AMBULATORY_CARE_PROVIDER_SITE_OTHER): Payer: Self-pay

## 2014-12-28 DIAGNOSIS — Z0289 Encounter for other administrative examinations: Secondary | ICD-10-CM

## 2015-01-03 ENCOUNTER — Telehealth: Payer: Self-pay | Admitting: *Deleted

## 2015-01-03 ENCOUNTER — Encounter: Payer: Self-pay | Admitting: *Deleted

## 2015-01-25 ENCOUNTER — Other Ambulatory Visit: Payer: Self-pay | Admitting: Neurology

## 2015-06-06 ENCOUNTER — Ambulatory Visit (INDEPENDENT_AMBULATORY_CARE_PROVIDER_SITE_OTHER): Payer: Medicare Other | Admitting: Nurse Practitioner

## 2015-06-06 ENCOUNTER — Encounter: Payer: Self-pay | Admitting: Nurse Practitioner

## 2015-06-06 VITALS — BP 117/62 | HR 65 | Ht 63.0 in | Wt 117.0 lb

## 2015-06-06 DIAGNOSIS — R413 Other amnesia: Secondary | ICD-10-CM

## 2015-06-06 DIAGNOSIS — R2681 Unsteadiness on feet: Secondary | ICD-10-CM | POA: Diagnosis not present

## 2015-06-06 DIAGNOSIS — G309 Alzheimer's disease, unspecified: Secondary | ICD-10-CM | POA: Diagnosis not present

## 2015-06-06 DIAGNOSIS — F0280 Dementia in other diseases classified elsewhere without behavioral disturbance: Secondary | ICD-10-CM

## 2015-06-06 NOTE — Progress Notes (Signed)
[facility]  PATIENT: Kelly Cross DOB: [birth year]   REASON FOR VISIT: Follow-up for memory loss, gait instability HISTORY FROM: Patient and son    HISTORY OF PRESENT ILLNESS: HISTORY: Kelly Cross is a 82 years old right-handed African American female, accompanied by her son, referred by her primary care physician for evaluation of memory loss.  She has past medical history of pulmonary sarcoidosis, was treated with steroid more than 40 years ago, also history of hypertension, anxiety, bilateral cataract surgery, she had college degree, taught sixth-grade transiently, was a homemaker,, but very actively involved in church, community service, her son stated that she really the captain of her family, has been taking care of her sick husband, who suffered Alzheimer's disease, her son who has schizophrenia, was dedicated.  She was noted to get frustrated easily over the past one year, which is not her normal self, also began to make mistakes, she forgets to take her medications sometimes, but she is still very active at home, she is supervising her son, who suffers schizophrenia, she is his main caregiver, supervising his medications, she is driving only short distance, but even that she got lost sometimes, she still pay home bills, but her son began to step in to help her She denies significant visual loss, no lateralized motor or sensory deficit, her son provide most of the story  MRI of the brain showed mild atrophy, laboratory evaluation was normal, including B12. Son reported, she was highly functional, now struggling with her daily activity, no longer driving, she also has hard of hearing  UPDATE Feb 13 2014: She continue decline with her memory, increased confusion, also mild unsteady gait, complains of frequent low back pain, taking Tylenol almost every night to sleep, she used to take gabapentin 300 mg twice a day for restless leg syndrome, was not sure it has  been helpful, she is taking Namenda, and Aricept. She still takes care of her younger son, who suffered Schizophrenic. UPDATE December 04 2014: She is with her son Kelly Cross at today's visit She is taking Namazaric, tolerating it well, her son son is concerned about her increase the memory trouble, she has more trouble handling daily activity, such as cooking, keep up with her medications, she put Vaseline inside her mouth for her dry mouth, she is very hesitant to accept home help, which is well covered under her long-term care UPDATE 06/06/2015; Kelly Cross, 82 year old female returns for follow-up she was last seen in the office 12/04/2014 by Dr. Terrace Arabia. She is accompanied by her son. She is currently taking Namazaric without side effects. She no longer drives. She can feed and dress herself, she needs assistance with bathing. There have been no falls since last seen. Her bedroom is upstairs. She is no longer cooking. She returns for reevaluation. Son has multiple questions about providing the best care for her. Records were reviewed  REVIEW OF SYSTEMS: Full 14 system review of systems performed and notable only for those listed, all others are neg:  Constitutional: neg  Cardiovascular: neg Ear/Nose/Throat: neg  Skin: neg Eyes: neg Respiratory: neg Gastroitestinal: neg  Hematology/Lymphatic: neg  Endocrine: neg Musculoskeletal:neg Allergy/Immunology: neg Neurological: Memory loss Psychiatric: neg Sleep : neg   ALLERGIES: No Known Allergies  HOME MEDICATIONS: Outpatient Prescriptions Prior to Visit  Medication Sig Dispense Refill  . Acetaminophen (TYLENOL EXTRA STRENGTH PO) Take 500 mg by mouth.     . BELSOMRA 10 MG TABS at bedtime.     Marland Kitchen CALCIUM ASPARTATE  PO Take by mouth daily.    . hydrochlorothiazide (HYDRODIURIL) 25 MG tablet Take 25 mg by mouth daily.    . Multiple Vitamins-Minerals (CENTRUM SILVER PO) Take by mouth daily.    Marland Kitchen NAMZARIC 28-10 MG CP24 TAKE ONE CAPSULE BY MOUTH DAILY  (Patient taking differently: TAKE ONE CAPSULE BY MOUTH at night) 30 capsule 6  . neomycin-polymyxin b-dexamethasone (MAXITROL) 3.5-10000-0.1 OINT     . omeprazole (PRILOSEC) 20 MG capsule Take 20 mg by mouth daily.    . sertraline (ZOLOFT) 100 MG tablet TK 1 T PO ONCE A DAY  12   No facility-administered medications prior to visit.    PAST MEDICAL HISTORY: Past Medical History  Diagnosis Date  . High blood pressure   . High cholesterol   . Anxiety     PAST SURGICAL HISTORY: Past Surgical History  Procedure Laterality Date  . Eye implants      1999  . Back surgery      1995  . Vaginal hysterectomy      1995  . Aural atresia repair      1983    FAMILY HISTORY: Family History  Problem Relation Age of Onset  . Diabetes Brother   . Leukemia Sister     SOCIAL HISTORY: Social History   Social History  . Marital Status: Widowed    Spouse Name: N/A  . Number of Children: 3  . Years of Education: N/A   Occupational History  . Not on file.   Social History Main Topics  . Smoking status: Never Smoker   . Smokeless tobacco: Never Used  . Alcohol Use: No  . Drug Use: No  . Sexual Activity: Not on file   Other Topics Concern  . Not on file   Social History Narrative   Living with 2 sons.       PHYSICAL EXAM  Filed Vitals:   06/06/15 1041  BP: 117/62  Pulse: 65  Height: 5\' 3"  (1.6 m)  Weight: 117 lb (53.071 kg)   Body mass index is 20.73 kg/(m^2).  Generalized: Well developed, in no acute distress , well groomed Head: normocephalic and atraumatic,. Oropharynx benign  Neck: Supple, no carotid bruits  Cardiac: Regular rate rhythm, no murmur  Musculoskeletal: No deformity   Neurological examination   Mentation: Alert , MMSE 22/30. She is not oriented to the day season or clinic. She missed 2 of 3 recall and questions in  calculation . AFT 8.   Follows all commands speech and language fluent.   Cranial nerve II-XII: .Pupils were equal round reactive to  light extraocular movements were full, visual field were full on confrontational test. Facial sensation and strength were normal. hearing was intact to finger rubbing bilaterally. Uvula tongue midline. head turning and shoulder shrug were normal and symmetric.Tongue protrusion into cheek strength was normal. Motor: normal bulk and tone, full strength in the BUE, BLE, except for mild left ankle dorsiflexion weakness. Sensory: normal and symmetric to light touch, pinprick, and  Vibration,  Coordination: finger-nose-finger, heel-to-shin bilaterally, no dysmetria Reflexes: 1+ upper lower and symmetric except absent at ankles  Gait and Station: Rising up from seated position with push off, wide based and mildly unsteady gait with left foot drop.  DIAGNOSTIC DATA (LABS, IMAGING, TESTING) -  ASSESSMENT AND PLAN  82 y.o. year old female  has a past medical history of dementia probable Alzheimer's dementia. Gait difficulty,  low back pain left ankle dorsiflexion weakness most consistent with lumbar stenosis.  Continue Namzaric at current dose will refill Create a safe environment, remove locks on bathroom  Doors Reduced confusion, keep familiar objects and people around, stick to a routine Use effective communication such as simple words and short sentences Reduce nighttime restlessness, a consistent nighttime routine,  avoid napping during the day Encourage good nutrition and hydration Seek medical care for acute worsening confusion or fever, this usually indicates infection Recommend putting grip strips on the stairway to her bedroom Exercise memory by utilizing strategy games such as cards cross words, Scrabble etc. F/U in 6 months Vst time 25 min Nilda Riggs, [facility], [facility], APRN  [facility] [address] [place], [place] [address] [phone]

## 2015-06-06 NOTE — Progress Notes (Signed)
I have reviewed and agreed above plan. 

## 2015-06-06 NOTE — Patient Instructions (Signed)
Continue Namzaric at current dose will refill Create a safe environment, remove locks on bathroom  Doors Reduced confusion, keep familiar objects and people around, stick to a routine Use effective communication such as simple words and short sentences Reduce nighttime restlessness, a consistent nighttime routine,  avoid napping during the day Encourage good nutrition and hydration Seek medical care for acute worsening confusion or fever, this usually indicates infection F/U in 6 months

## 2015-08-14 ENCOUNTER — Telehealth: Payer: Self-pay | Admitting: Neurology

## 2015-08-14 MED ORDER — MEMANTINE HCL-DONEPEZIL HCL ER 28-10 MG PO CP24: 1.0000 | ORAL_CAPSULE | Freq: Every day | ORAL | Status: DC

## 2015-08-14 NOTE — Telephone Encounter (Signed)
Rx sent to pharmacy with additional refills.  Patient current on appointments.

## 2015-08-14 NOTE — Telephone Encounter (Signed)
Gearldine BienenstockBrandy with [facility] [phone](f) [phone] called [facility]24 for pt.

## 2015-12-05 ENCOUNTER — Ambulatory Visit (INDEPENDENT_AMBULATORY_CARE_PROVIDER_SITE_OTHER): Payer: Medicare Other | Admitting: Nurse Practitioner

## 2015-12-05 ENCOUNTER — Encounter: Payer: Self-pay | Admitting: Nurse Practitioner

## 2015-12-05 VITALS — BP 127/64 | HR 62 | Ht 63.0 in | Wt 120.0 lb

## 2015-12-05 DIAGNOSIS — F0280 Dementia in other diseases classified elsewhere without behavioral disturbance: Secondary | ICD-10-CM

## 2015-12-05 DIAGNOSIS — G309 Alzheimer's disease, unspecified: Secondary | ICD-10-CM | POA: Diagnosis not present

## 2015-12-05 DIAGNOSIS — R413 Other amnesia: Secondary | ICD-10-CM

## 2015-12-05 DIAGNOSIS — R2681 Unsteadiness on feet: Secondary | ICD-10-CM | POA: Diagnosis not present

## 2015-12-05 MED ORDER — MEMANTINE HCL-DONEPEZIL HCL ER 28-10 MG PO CP24: 1.0000 | ORAL_CAPSULE | Freq: Every day | ORAL | Status: DC

## 2015-12-05 NOTE — Progress Notes (Signed)
I have reviewed and agreed above plan. 

## 2015-12-05 NOTE — Progress Notes (Signed)
[facility]  PATIENT: Kelly DrillingFloydelia F Cross DOB: 1926-07-21   REASON FOR VISIT: Follow-up for Alzheimer's dementia, gait abnormality HISTORY FROM: Patient and son    HISTORY OF PRESENT ILLNESS:Kelly Cross is a 82 years old right-handed African American female, accompanied by her son, referred by her primary care physician for evaluation of memory loss.  She has past medical history of pulmonary sarcoidosis, was treated with steroid more than 40 years ago, also history of hypertension, anxiety, bilateral cataract surgery, she had college degree, taught sixth-grade transiently, was a homemaker,, but very actively involved in church, community service, her son stated that she really the captain of her family, has been taking care of her sick husband, who suffered Alzheimer's disease, her son who has schizophrenia, was dedicated.  She was noted to get frustrated easily over the past one year, which is not her normal self, also began to make mistakes, she forgets to take her medications sometimes, but she is still very active at home, she is supervising her son, who suffers schizophrenia, she is his main caregiver, supervising his medications, she is driving only short distance, but even that she got lost sometimes, she still pay home bills, but her son began to step in to help her She denies significant visual loss, no lateralized motor or sensory deficit, her son provide most of the story  MRI of the brain showed mild atrophy, laboratory evaluation was normal, including B12. Son reported, she was highly functional, now struggling with her daily activity, no longer driving, she also has hard of hearing  UPDATE Feb 13 2014: She continue decline with her memory, increased confusion, also mild unsteady gait, complains of frequent low back pain, taking Tylenol almost every night to sleep, she used to take gabapentin 300 mg twice a day for restless leg syndrome, was not sure it has  been helpful, she is taking Namenda, and Aricept. She still takes care of her younger son, who suffered Schizophrenic. UPDATE December 04 2014: She is with her son Kelly Cross at today's visit She is taking Namazaric, tolerating it well, her son son is concerned about her increase the memory trouble, she has more trouble handling daily activity, such as cooking, keep up with her medications, she put Vaseline inside her mouth for her dry mouth, she is very hesitant to accept home help, which is well covered under her long-term care UPDATE 06/06/2015; Kelly Cross, 82 year old female returns for follow-up she was last seen in the office 12/04/2014 by Dr. Terrace ArabiaYan. She is accompanied by her son. She is currently taking Namazaric without side effects. She no longer drives. She can feed and dress herself, she needs assistance with bathing. There have been no falls since last seen. Her bedroom is upstairs. She is no longer cooking. She returns for reevaluation. Son has multiple questions about providing the best care for her. Records were reviewed UPDATE 07/12/2017CM Kelly Cross, 82 year old female returns for follow-up for memory loss, Alzheimer's dementia. She is currently taking Namzaric without side effects. She needs assistance with bathing but she can feed and dress herself. She no longer drives, she no longer cooks. She reads books. She returns for reevaluation  REVIEW OF SYSTEMS: Full 14 system review of systems performed and notable only for those listed, all others are neg:  Constitutional: neg  Cardiovascular: neg Ear/Nose/Throat: neg  Skin: neg Eyes: neg Respiratory: neg Gastroitestinal: neg  Hematology/Lymphatic: neg  Endocrine: neg Musculoskeletal:neg Allergy/Immunology: neg Neurological: Memory loss Psychiatric: neg Sleep : neg  ALLERGIES: No Known Allergies  HOME MEDICATIONS: Outpatient Prescriptions Prior to Visit  Medication Sig Dispense Refill  . Acetaminophen (TYLENOL EXTRA STRENGTH  PO) Take 500 mg by mouth.     . BELSOMRA 10 MG TABS at bedtime.     Marland Kitchen CALCIUM ASPARTATE PO Take by mouth daily.    . hydrochlorothiazide (HYDRODIURIL) 25 MG tablet Take 25 mg by mouth daily.    . Memantine HCl-Donepezil HCl (NAMZARIC) 28-10 MG CP24 Take 1 capsule by mouth daily. 30 capsule 11  . Multiple Vitamins-Minerals (CENTRUM SILVER PO) Take by mouth daily.    Marland Kitchen neomycin-polymyxin b-dexamethasone (MAXITROL) 3.5-10000-0.1 OINT     . omeprazole (PRILOSEC) 20 MG capsule Take 20 mg by mouth daily.    . sertraline (ZOLOFT) 100 MG tablet TK 1 T PO ONCE A DAY  12   No facility-administered medications prior to visit.    PAST MEDICAL HISTORY: Past Medical History  Diagnosis Date  . High blood pressure   . High cholesterol   . Anxiety     PAST SURGICAL HISTORY: Past Surgical History  Procedure Laterality Date  . Eye implants      1999  . Back surgery      1995  . Vaginal hysterectomy      1995  . Aural atresia repair      1983    FAMILY HISTORY: Family History  Problem Relation Age of Onset  . Diabetes Brother   . Leukemia Sister     SOCIAL HISTORY: Social History   Social History  . Marital Status: Widowed    Spouse Name: N/A  . Number of Children: 3  . Years of Education: N/A   Occupational History  . Not on file.   Social History Main Topics  . Smoking status: Never Smoker   . Smokeless tobacco: Never Used  . Alcohol Use: No  . Drug Use: No  . Sexual Activity: Not on file   Other Topics Concern  . Not on file   Social History Narrative   Living with 2 sons.       PHYSICAL EXAM  Filed Vitals:   12/05/15 1021  Height: 5\' 3"  (1.6 m)  Weight: 120 lb (54.432 kg)   Body mass index is 21.26 kg/(m^2). Generalized: Well developed, in no acute distress , well groomed Head: normocephalic and atraumatic,. Oropharynx benign  Neck: Supple, no carotid bruits  Cardiac: Regular rate rhythm, no murmur  Musculoskeletal: No deformity   Neurological  examination   Mentation: Alert , MMSE 25/30. She is not oriented to the date,  season or clinic. She missed 1 of 3 recall and questions and 1 in calculation . AFT 6. Clock drawing 3/4.Follows all commands speech and language fluent.   Cranial nerve II-XII: .Pupils were equal round reactive to light extraocular movements were full, visual field were full on confrontational test. Facial sensation and strength were normal. hearing was intact to finger rubbing bilaterally. Uvula tongue midline. head turning and shoulder shrug were normal and symmetric.Tongue protrusion into cheek strength was normal. Motor: normal bulk and tone, full strength in the BUE, BLE, except for mild left ankle dorsiflexion weakness. Sensory: normal and symmetric to light touch, pinprick, and Vibration,  Coordination: finger-nose-finger, heel-to-shin bilaterally, no dysmetria Reflexes: 1+ upper lower and symmetric except absent at ankles  Gait and Station: Rising up from seated position with push off, wide based and mildly unsteady gait with left foot drop. No assistive device DIAGNOSTIC DATA (LABS, IMAGING, TESTING) -  ASSESSMENT AND PLAN 82 y.o. year old female has a past medical history of dementia probable Alzheimer's dementia. Gait difficulty, low back pain left ankle dorsiflexion weakness most consistent with lumbar stenosis.    Continue Namzaric at current dose will refill Exercise memory by utilizing strategy games such as cards Cross words, Scrabble etc. Be careful with ambulation at risk for falls F/U in 6 months  Next with Dr. Carmelina Noun, [facility], [facility], APRN  [facility] [address], [address] [place], [place] [address] [phone]

## 2015-12-05 NOTE — Patient Instructions (Signed)
Continue Namzaric at current dose will refill Memory score is stable Exercise memory by utilizing strategy games such as cards cross words, Scrabble etc. F/U in 6 months  Next with dr. Terrace ArabiaYan

## 2015-12-17 ENCOUNTER — Ambulatory Visit
Admission: RE | Admit: 2015-12-17 | Discharge: 2015-12-17 | Disposition: A | Discharge status: Home / Self Care | Payer: Medicare Other | Source: Ambulatory Visit | Attending: Internal Medicine | Admitting: Internal Medicine

## 2015-12-17 ENCOUNTER — Other Ambulatory Visit: Payer: Self-pay | Admitting: Internal Medicine

## 2015-12-17 DIAGNOSIS — M4716 Other spondylosis with myelopathy, lumbar region: Secondary | ICD-10-CM

## 2016-04-11 ENCOUNTER — Telehealth (INDEPENDENT_AMBULATORY_CARE_PROVIDER_SITE_OTHER): Payer: Self-pay | Admitting: Internal Medicine

## 2016-04-11 NOTE — Telephone Encounter (Signed)
No action required.

## 2016-04-14 ENCOUNTER — Encounter (INDEPENDENT_AMBULATORY_CARE_PROVIDER_SITE_OTHER): Payer: Self-pay | Admitting: Orthopedic Surgery

## 2016-04-14 ENCOUNTER — Ambulatory Visit (INDEPENDENT_AMBULATORY_CARE_PROVIDER_SITE_OTHER): Payer: Medicare Other | Admitting: Family

## 2016-04-14 VITALS — Ht 63.0 in | Wt 120.0 lb

## 2016-04-14 DIAGNOSIS — M545 Low back pain, unspecified: Secondary | ICD-10-CM

## 2016-04-14 DIAGNOSIS — R2681 Unsteadiness on feet: Secondary | ICD-10-CM

## 2016-04-14 NOTE — Progress Notes (Signed)
Office Visit Note   Patient: Kelly Cross           Date of Birth: 11-25-1926           MRN: [id number] Visit Date: 04/14/2016              Requested by: No referring provider defined for this encounter. PCP: No PCP Per Patient   Assessment & Plan: Visit Diagnoses:  1. Gait instability   2. Low back pain without sciatica, unspecified back pain laterality, unspecified chronicity     Plan: She will proceed with physical therapy. She will follow up in the office in 4 weeks with us. We will reevaluate her low back pain and gait. Continue Voltaren gel as needed. Given the phone number for [facility] Energy[facility]inda Hopkins with [employer] for a home evaluation. ([phone])  Follow-Up Instructions: Return in about 4 weeks (around 05/12/2016).   Orders:  Orders Placed This Encounter  Procedures  . Ambulatory referral to Physical Therapy   No orders of the defined types were placed in this encounter.     Procedures: No procedures performed   Clinical Data: No additional findings.   Subjective: Chief Complaint  Patient presents with  . Left Hip - Pain  . Right Hip - Pain  . Right Knee - Pain  . Left Knee - Pain    Patient presents today with discomfort bilateral knees and hips. Her son accompanies with her at appointment today. He states she has been having abnormality in her gait for a while. She has had a fall back in the early spring. He tries to encourage her to wear flat shoes - she insist on wearing a heel. Will not use assitive devices. Does walk holding on to sons, wall, back of couch etc for support.   She is currently using voltaren gel topically for bilateral knees and low back. Majority of history provided by son. She denies pain anywhere is without complaint today. Does have Alzhiemers. Son relates she methodically applies the Voltaren bid.  Son is worried mainly about her gait and possibility of an injury leading to needing the Voltaren.    Review of  Systems  Constitutional: Negative for chills and fever.  Musculoskeletal: Positive for arthralgias, back pain and gait problem. Negative for joint swelling and myalgias.  Psychiatric/Behavioral: Positive for confusion.     Objective: Vital Signs: Ht 5\' 3"  (1.6 m)   Wt 120 lb (54.4 kg)   BMI 21.26 kg/m   Physical Exam  Constitutional: She appears well-developed and well-nourished.  Pulmonary/Chest: Effort normal.  Musculoskeletal:       Right knee: She exhibits no effusion.       Left knee: She exhibits no effusion.  Neurological: She is alert. She is disoriented.  Psychiatric: She has a normal mood and affect.  Nursing note reviewed.   Right Knee Exam  Right knee exam is normal.  Tenderness  The patient is experiencing no tenderness.     Range of Motion  The patient has normal right knee ROM.  Tests  Varus: negative Valgus: negative  Other  Swelling: none Other tests: no effusion present   Left Knee Exam  Left knee exam is normal.  Tenderness  The patient is experiencing no tenderness.     Range of Motion  The patient has normal left knee ROM.  Tests  Varus: negative Valgus: negative  Other  Swelling: none Effusion: no effusion present   Back Exam   Tenderness  The  patient is experiencing no tenderness.   Range of Motion  The patient has normal back ROM.  Tests  Straight leg raise right: negative Straight leg raise left: negative  Other  Gait: antalgic       Specialty Comments:  No specialty comments available.  Imaging: No results found.   PMFS History: Patient Active Problem List   Diagnosis Date Noted  . Dementia of the Alzheimer's type without behavioral disturbance 10/11/2013  . Gait instability 10/11/2013  . Memory loss 09/06/2012  . Benign paroxysmal positional vertigo 09/06/2012  . High cholesterol   . Anxiety    Past Medical History:  Diagnosis Date  . Anxiety   . High blood pressure   . High cholesterol      Family History  Problem Relation Age of Onset  . Diabetes Brother   . Leukemia Sister     Past Surgical History:  Procedure Laterality Date  . AURAL ATRESIA REPAIR     1983  . BACK SURGERY     1995  . eye implants     1999  . VAGINAL HYSTERECTOMY     1995   Social History   Occupational History  . Not on file.   Social History Main Topics  . Smoking status: Never Smoker  . Smokeless tobacco: Never Used  . Alcohol use No  . Drug use: No  . Sexual activity: Not on file

## 2016-05-15 ENCOUNTER — Ambulatory Visit (INDEPENDENT_AMBULATORY_CARE_PROVIDER_SITE_OTHER): Payer: Medicare Other | Admitting: Orthopedic Surgery

## 2016-05-15 ENCOUNTER — Encounter (INDEPENDENT_AMBULATORY_CARE_PROVIDER_SITE_OTHER): Payer: Self-pay

## 2016-05-15 ENCOUNTER — Ambulatory Visit (INDEPENDENT_AMBULATORY_CARE_PROVIDER_SITE_OTHER): Payer: Medicare Other

## 2016-05-15 VITALS — Ht 63.0 in | Wt 120.0 lb

## 2016-05-15 DIAGNOSIS — M25561 Pain in right knee: Secondary | ICD-10-CM | POA: Diagnosis not present

## 2016-05-15 DIAGNOSIS — G8929 Other chronic pain: Secondary | ICD-10-CM | POA: Insufficient documentation

## 2016-05-15 MED ORDER — LIDOCAINE HCL 1 % IJ SOLN
5.0000 mL | INTRAMUSCULAR | Status: AC | PRN
  Administered 2016-05-15: 5 mL

## 2016-05-15 MED ORDER — METHYLPREDNISOLONE ACETATE 40 MG/ML IJ SUSP
40.0000 mg | INTRAMUSCULAR | Status: AC | PRN
  Administered 2016-05-15: 40 mg via INTRA_ARTICULAR

## 2016-05-15 NOTE — Progress Notes (Signed)
Office Visit Note   Patient: Kelly Cross           Date of Birth: 1926/09/02           MRN: [id number] Visit Date: 05/15/2016              Requested by: No referring provider defined for this encounter. PCP: No PCP Per Patient   Assessment & Plan: Visit Diagnoses:  1. Chronic pain of right knee     Plan: Patient's right knee was injected without complication she tolerated this well plan to follow-up as needed.  Follow-Up Instructions: Return if symptoms worsen or fail to improve.   Orders:  Orders Placed This Encounter  Procedures  . XR Knee 1-2 Views Right   No orders of the defined types were placed in this encounter.     Procedures: Large Joint Inj Date/Time: 05/15/2016 4:04 PM Performed by: Mariusz Jubb V Authorized by: Nadara Mustard   Consent Given by:  Patient Site marked: the procedure site was marked   Timeout: prior to procedure the correct patient, procedure, and site was verified   Indications:  Pain and diagnostic evaluation Location:  Knee Site:  R knee Prep: patient was prepped and draped in usual sterile fashion   Needle Size:  22 G Needle Length:  1.5 inches Approach:  Anteromedial Ultrasound Guidance: No   Fluoroscopic Guidance: No   Arthrogram: No   Medications:  5 mL lidocaine 1 %; 40 mg methylPREDNISolone acetate 40 MG/ML Aspiration Attempted: No   Patient tolerance:  Patient tolerated the procedure well with no immediate complications     Clinical Data: No additional findings.   Subjective: Chief Complaint  Patient presents with  . Right Knee - Pain  . Lower Back - Pain    Pt in office today for follow up low back pain and she states that this is slightly better. She uses voltaren gel on her back and this helps. Her son states that physical therapy never contacted them for an appt. She is also complaining of right posterior knee pain and that it is tender to walk on. She does feel pain with ROM.     Review of  Systems   Objective: Vital Signs: Ht 5\' 3"  (1.6 m)   Wt 120 lb (54.4 kg)   BMI 21.26 kg/m   Physical Exam on examination patient is alert oriented no adenopathy well-dressed on the left leg normal rest for effort she does have some start up stiffness with ambulation. Examination there is no effusion collaterals and cruciates are stable. She has some mild tenderness to palpation medial lateral joint line as well as the patellofemoral joint.  Ortho Exam  Specialty Comments:  No specialty comments available.  Imaging: Xr Knee 1-2 Views Right  Result Date: 05/15/2016 V with mild subchondral sclerosis no periarticular bony spurs.iew radiographs of the right knee shows lateral joint line narrowing    PMFS History: Patient Active Problem List   Diagnosis Date Noted  . Chronic pain of right knee 05/15/2016  . Dementia of the Alzheimer's type without behavioral disturbance 10/11/2013  . Gait instability 10/11/2013  . Memory loss 09/06/2012  . Benign paroxysmal positional vertigo 09/06/2012  . High cholesterol   . Anxiety    Past Medical History:  Diagnosis Date  . Anxiety   . High blood pressure   . High cholesterol     Family History  Problem Relation Age of Onset  . Diabetes Brother   .  Leukemia Sister     Past Surgical History:  Procedure Laterality Date  . AURAL ATRESIA REPAIR     1983  . BACK SURGERY     1995  . eye implants     1999  . VAGINAL HYSTERECTOMY     1995   Social History   Occupational History  . Not on file.   Social History Main Topics  . Smoking status: Never Smoker  . Smokeless tobacco: Never Used  . Alcohol use No  . Drug use: No  . Sexual activity: Not on file

## 2016-05-16 ENCOUNTER — Ambulatory Visit (INDEPENDENT_AMBULATORY_CARE_PROVIDER_SITE_OTHER): Payer: Self-pay | Admitting: Family

## 2016-06-10 ENCOUNTER — Ambulatory Visit (INDEPENDENT_AMBULATORY_CARE_PROVIDER_SITE_OTHER): Payer: Medicare Other | Admitting: Neurology

## 2016-06-10 ENCOUNTER — Encounter: Payer: Self-pay | Admitting: Neurology

## 2016-06-10 VITALS — BP 114/60 | HR 69 | Ht 63.0 in | Wt 117.0 lb

## 2016-06-10 DIAGNOSIS — R413 Other amnesia: Secondary | ICD-10-CM | POA: Diagnosis not present

## 2016-06-10 DIAGNOSIS — G309 Alzheimer's disease, unspecified: Secondary | ICD-10-CM

## 2016-06-10 DIAGNOSIS — F0280 Dementia in other diseases classified elsewhere without behavioral disturbance: Secondary | ICD-10-CM | POA: Diagnosis not present

## 2016-06-10 DIAGNOSIS — R2681 Unsteadiness on feet: Secondary | ICD-10-CM | POA: Diagnosis not present

## 2016-06-10 MED ORDER — MEMANTINE HCL-DONEPEZIL HCL ER 28-10 MG PO CP24: 1.0000 | ORAL_CAPSULE | Freq: Every day | ORAL | 4 refills | Status: DC

## 2016-06-10 NOTE — Progress Notes (Signed)
[facility]  PATIENT: Kelly Cross DOB: [birth year]   REASON FOR VISIT: Follow-up for Alzheimer's dementia, gait abnormality HISTORY FROM: Patient and son  HISTORY OF PRESENT ILLNESS:Kelly Cross is a 83 years old right-handed African American female, accompanied by her son, referred by her primary care physician for evaluation of memory loss.   She has past medical history of pulmonary sarcoidosis, was treated with steroid more than 40 years ago, also history of hypertension, anxiety, bilateral cataract surgery, she had college degree, taught sixth-grade transiently, was a homemaker,, but very actively involved in church, community service, her son stated that she really the captain of her family, has been taking care of her sick husband, who suffered Alzheimer's disease, her son who has schizophrenia, was dedicated.   She was noted to get frustrated easily over the past one year, which is not her normal self, also began to make mistakes, she forgets to take her medications sometimes, but she is still very active at home, she is supervising her son, who suffers schizophrenia, she is his main caregiver, supervising his medications, she is driving only short distance, but even that she got lost sometimes, she still pay home bills, but her son began to step in to help her  She denies significant visual loss, no lateralized motor or sensory deficit, her son provide most of the story  MRI of the brain showed mild atrophy, laboratory evaluation was normal, including B12. Son reported, she was highly functional, now struggling with her daily activity, no longer driving, she also has hard of hearing  UPDATE Feb 13 2014: She continue decline with her memory, increased confusion, also mild unsteady gait, complains of frequent low back pain, taking Tylenol almost every night to sleep, she used to take gabapentin 300 mg twice a day for restless leg syndrome, was not sure it  has been helpful, she is taking Namenda, and Aricept. She still takes care of her younger son, who suffered Schizophrenic. UPDATE December 04 2014: She is with her son Burton Apley at today's visit She is taking Namazaric, tolerating it well, her son son is concerned about her increase the memory trouble, she has more trouble handling daily activity, such as cooking, keep up with her medications, she put Vaseline inside her mouth for her dry mouth, she is very hesitant to accept home help, which is well covered under her long-term care  UPDATE 06/06/2015; Kelly Cross, 83 year old female returns for follow-up she was last seen in the office 12/04/2014 by Dr. Terrace Arabia. She is accompanied by her son. She is currently taking Namazaric without side effects. She no longer drives. She can feed and dress herself, she needs assistance with bathing. There have been no falls since last seen. Her bedroom is upstairs. She is no longer cooking. She returns for reevaluation. Son has multiple questions about providing the best care for her. Records were reviewed UPDATE 07/12/2017CM Kelly Cross, 83 year old female returns for follow-up for memory loss, Alzheimer's dementia. She is currently taking Namzaric without side effects. She needs assistance with bathing but she can feed and dress herself. She no longer drives, she no longer cooks. She reads books. She returns for reevaluation  UPDATE Jan 16th 2018: She still lives with her two sons, she has quit driving in 6962, she get up every morning to fix breakfast, fix dinner, she rarely goes to stove anymore, she tried to clean the house, she walks around, trying to find misplaced things.  She still has other 7 siblings,  from age 81-96, her son [name] has insulin dependent DM, her MMSE is 23/30,   REVIEW OF SYSTEMS: Full 14 system review of systems performed and notable only for those listed, all others are neg: Activity change, appetite change, hearing loss, cough, joint pain, memory  loss  ALLERGIES: No Known Allergies  HOME MEDICATIONS: Outpatient Medications Prior to Visit  Medication Sig Dispense Refill  . Acetaminophen (TYLENOL EXTRA STRENGTH PO) Take 500 mg by mouth.     . BELSOMRA 10 MG TABS at bedtime.     Marland Kitchen. CALCIUM ASPARTATE PO Take by mouth daily.    . diclofenac sodium (VOLTAREN) 1 % GEL     . hydrochlorothiazide (HYDRODIURIL) 25 MG tablet Take 25 mg by mouth daily.    . Memantine HCl-Donepezil HCl (NAMZARIC) 28-10 MG CP24 Take 1 capsule by mouth daily. 30 capsule 11  . Multiple Vitamins-Minerals (CENTRUM SILVER PO) Take by mouth daily.    Marland Kitchen. omeprazole (PRILOSEC) 20 MG capsule Take 20 mg by mouth daily.    . sertraline (ZOLOFT) 100 MG tablet TK 1 T PO ONCE A DAY  12  . neomycin-polymyxin b-dexamethasone (MAXITROL) 3.5-10000-0.1 OINT      No facility-administered medications prior to visit.     PAST MEDICAL HISTORY: Past Medical History:  Diagnosis Date  . Anxiety   . High blood pressure   . High cholesterol     PAST SURGICAL HISTORY: Past Surgical History:  Procedure Laterality Date  . AURAL ATRESIA REPAIR     1983  . BACK SURGERY     1995  . eye implants     1999  . VAGINAL HYSTERECTOMY     1995    FAMILY HISTORY: Family History  Problem Relation Age of Onset  . Diabetes Brother   . Leukemia Sister     SOCIAL HISTORY: Social History   Social History  . Marital status: Widowed    Spouse name: N/A  . Number of children: 3  . Years of education: N/A   Occupational History  . Not on file.   Social History Main Topics  . Smoking status: Never Smoker  . Smokeless tobacco: Never Used  . Alcohol use No  . Drug use: No  . Sexual activity: Not on file   Other Topics Concern  . Not on file   Social History Narrative   Living with 2 sons.       PHYSICAL EXAM  Vitals:   06/10/16 1052  BP: 114/60  Pulse: 69  Weight: 117 lb (53.1 kg)  Height: 5\' 3"  (1.6 m)   Body mass index is 20.73 kg/m.  PHYSICAL  EXAMNIATION:  Gen: NAD, conversant, well nourised, obese, well groomed                     Cardiovascular: Regular rate rhythm, no peripheral edema, warm, nontender. Eyes: Conjunctivae clear without exudates or hemorrhage Neck: Supple, no carotid bruits. Pulmonary: Clear to auscultation bilaterally   NEUROLOGICAL EXAM:  MENTAL STATUS: Speech:    Speech is normal; fluent and spontaneous with normal comprehension.  Cognition: MMSE 23/30, animal naming 5     Orientation: She is not oriented to year     Recent and remote memory: She missed 3 out of 3 recall     Normal Attention span and concentration     Normal Language, naming, repeating,spontaneous speech     Fund of knowledge   CRANIAL NERVES: CN II: Visual fields are full to confrontation. Fundoscopic exam  is normal with sharp discs and no vascular changes. Pupils are round equal and briskly reactive to light. CN III, IV, VI: extraocular movement are normal. No ptosis. CN V: Facial sensation is intact to pinprick in all 3 divisions bilaterally. Corneal responses are intact.  CN VII: Face is symmetric with normal eye closure and smile. CN VIII: Hearing is normal to rubbing fingers CN IX, X: Palate elevates symmetrically. Phonation is normal. CN XI: Head turning and shoulder shrug are intact CN XII: Tongue is midline with normal movements and no atrophy.  MOTOR: There is no pronator drift of out-stretched arms. Muscle bulk and tone are normal. Muscle strength is normal.  REFLEXES: Reflexes are 2+ and symmetric at the biceps, triceps, knees, and ankles. Plantar responses are flexor.  SENSORY: Intact to light touch, pinprick, positional and vibratory sensation are intact in fingers and toes.  COORDINATION: Rapid alternating movements and fine finger movements are intact. There is no dysmetria on finger-to-nose and heel-knee-shin.    GAIT/STANCE: Posture is normal. Gait is steady with normal steps, base, arm swing, and turning.   Romberg is absent.   DIAGNOSTIC DATA (LABS, IMAGING, TESTING) -  ASSESSMENT AND PLAN 83 y.o. year old female has a past medical history of dementia probable Alzheimer's dementia.  Continue Namzaric at current dose will refill Exercise memory by utilizing strategy games such as cards cross words, Scrabble etc. Be careful with ambulation at risk for falls F/U in 6 months with nurse practitioner Her son talked about potential assistant living placement, patient currently is against idea  Levert Feinstein, M.D. Ph.D.  [facility] [address] [place], [place] [address] Phone: [phone] Fax:      [phone]

## 2016-07-23 ENCOUNTER — Ambulatory Visit: Payer: Medicare Other | Attending: Family | Admitting: Physical Therapy

## 2016-07-23 DIAGNOSIS — R2689 Other abnormalities of gait and mobility: Secondary | ICD-10-CM | POA: Diagnosis not present

## 2016-07-23 DIAGNOSIS — R2681 Unsteadiness on feet: Secondary | ICD-10-CM | POA: Insufficient documentation

## 2016-07-23 DIAGNOSIS — M6281 Muscle weakness (generalized): Secondary | ICD-10-CM | POA: Diagnosis present

## 2016-07-23 NOTE — Therapy (Signed)
[facility] [address] [place], [place], [address] Phone: [phone]   Fax:  [phone]  Physical Therapy Evaluation  Patient Details  Name: Kelly Cross MRN: [id number] Date of Birth: [birth year] Referring Provider: Adonis Huguenin, NP  Encounter Date: 07/23/2016      PT End of Session - 07/23/16 1251    Visit Number 1   Number of Visits 12   Date for PT Re-Evaluation 09/03/16   Authorization Type UHC Medicare   PT Start Time 1148   PT Stop Time 1228   PT Time Calculation (min) 40 min   Equipment Utilized During Treatment Gait belt   Activity Tolerance Patient tolerated treatment well   Behavior During Therapy [facility] for tasks assessed/performed      Past Medical History:  Diagnosis Date  . Anxiety   . High blood pressure   . High cholesterol     Past Surgical History:  Procedure Laterality Date  . AURAL ATRESIA REPAIR     1983  . BACK SURGERY     1995  . eye implants     1999  . VAGINAL HYSTERECTOMY     1995    There were no vitals filed for this visit.       Subjective Assessment - 07/23/16 1153    Subjective Pt is an 83 y/o female who presents to OPPT for balance difficulties and gait instabilities.  Pt's son provides most of her history, and reports about 4 yrs of instability.     Patient is accompained by: Family member  son-provides most of the history   Pertinent History dementia, HLD, anxiety   Limitations Walking   Patient Stated Goals pt unable to state; son wants to help improve pt's confidence, negotiate stairs better   Currently in Pain? No/denies            [facility] PT Assessment - 07/23/16 1155      Assessment   Medical Diagnosis gait instability   Referring Provider Kelly Huguenin, NP   Onset Date/Surgical Date --  4 yrs   Next MD Visit after PT   Prior Therapy at this clinic-similar condition about 1-2 years ago     Precautions   Precautions Fall     Restrictions   Weight Bearing Restrictions No     Balance Screen   Has the patient fallen in the past 6 months Yes   How many times? 1   Has the patient had a decrease in activity level because of a fear of falling?  Yes   Is the patient reluctant to leave their home because of a fear of falling?  No  son says yes     Engineer, agricultural residence   Living Arrangements Children  2 sons (one son is disabled veteran)   Available Help at Discharge Family;Available 24 hours/day  son has to leave for errands/MD appts   Type of Home House   Home Access Stairs to enter   Entrance Stairs-Number of Steps 4   Entrance Stairs-Rails Left   Home Layout Multi-level;Bed/bath upstairs   Alternate Level Stairs-Number of Steps 8   Alternate Level Stairs-Rails Left   Home Equipment Walker - 2 wheels;Cane - single point;Shower seat;Grab bars - tub/shower;Grab bars - toilet   Additional Comments Main Level: living room, kitchen, dining room     Prior Function   Level of Independence Needs assistance with gait  furniture walks in house, holds  onto son outside   Vocation Retired   Leisure walking outdoors with son weather permitting     Cognition   Overall Cognitive Status History of cognitive impairments - at baseline     Observation/Other Assessments   Focus on Therapeutic Outcomes (FOTO)  48 (52% limited; predicted 43% limited)   Activities of Balance Confidence Scale (ABC Scale)  34.4% confident     Strength   Overall Strength Comments tested in sitting   Strength Assessment Site Hip;Knee;Ankle   Right/Left Hip Left;Right   Right Hip Flexion 3/5   Left Hip Flexion 3/5   Right/Left Knee Right;Left   Right Knee Flexion 4/5   Right Knee Extension 5/5   Left Knee Flexion 4/5   Left Knee Extension 5/5   Right/Left Ankle Right;Left   Right Ankle Dorsiflexion 4/5   Left Ankle Dorsiflexion 4/5     Transfers   Five time sit to stand comments  20.28 seconds      Ambulation/Gait   Ambulation/Gait Yes   Ambulation/Gait Assistance 5: Supervision   Ambulation/Gait Assistance Details pt able to amb with supervision, but quick to reach for son's arm for support when he is nearby   Ambulation Distance (Feet) 150 Feet   Assistive device None;1 person hand held assist   Gait Pattern Wide base of support   Ambulation Surface Level;Indoor   Gait velocity 3.24 ft/sec   10.12     Standardized Balance Assessment   Standardized Balance Assessment Berg Balance Test;Timed Up and Go Test     Berg Balance Test   Sit to Stand Able to stand without using hands and stabilize independently   Standing Unsupported Able to stand safely 2 minutes   Sitting with Back Unsupported but Feet Supported on Floor or Stool Able to sit safely and securely 2 minutes   Stand to Sit Sits safely with minimal use of hands   Transfers Able to transfer safely, minor use of hands   Standing Unsupported with Eyes Closed Able to stand 10 seconds safely   Standing Ubsupported with Feet Together Able to place feet together independently and stand 1 minute safely   From Standing, Reach Forward with Outstretched Arm Can reach forward >12 cm safely (5")   From Standing Position, Pick up Object from Floor Able to pick up shoe safely and easily   From Standing Position, Turn to Look Behind Over each Shoulder Looks behind from both sides and weight shifts well   Turn 360 Degrees Able to turn 360 degrees safely but slowly   Standing Unsupported, Alternately Place Feet on Step/Stool Able to complete 4 steps without aid or supervision   Standing Unsupported, One Foot in Front Able to plae foot ahead of the other independently and hold 30 seconds   Standing on One Leg Tries to lift leg/unable to hold 3 seconds but remains standing independently   Total Score 47     Timed Up and Go Test   Normal TUG (seconds) 17.9                           PT Education - 07/23/16 1250     Education provided Yes   Education Details clinical findings, POC, goals of care   Person(s) Educated Patient;Child(ren)   Methods Explanation   Comprehension Verbalized understanding             PT Long Term Goals - 07/23/16 1254      PT LONG TERM  GOAL #1   Title verbalize /demonstrate understanding of fall prevention strategies within home environment with family. (Target Date: 09/03/16)   Time 6   Period Weeks   Status New     PT LONG TERM GOAL #2   Title pt/caregiver to report compliance with HEP/community fitness at least 3 days per week for continued exercise (Target Date: 09/03/16)   Time 6   Period Weeks   Status New     PT LONG TERM GOAL #3   Title improve BERG balance score to >/= 52/56 for improved balance (Target Date: 09/03/16)   Time 6   Period Weeks   Status New     PT LONG TERM GOAL #4   Title improve timed up and go to < 14 sec for improved functional mobility (Target Date: 09/03/16)   Time 6   Period Weeks   Status New     PT LONG TERM GOAL #5   Title negotiate at least 8 stairs with single handrail with supervision for improved household mobility (Target Date: 09/03/16)   Time 6   Period Weeks   Status New     PT LONG TERM GOAL #6   Title improve 5x STS to < 17 sec for improved functional strength and mobility (Target Date: 09/03/16)   Time 6   Period Weeks   Status New               Plan - Aug 15, 2016 1251    Clinical Impression Statement Pt is an 83 y/o female who presents to OPPT for moderate complexity PT eval for gait instability and imbalance.  Pt demonstrates mild balance deficits as well as strength deficits affecting safe functional mobility.  Feel pt's cognition likely to impact progress, but son is very supportive and aware of this.  Initiated discussion of adult care resources in the area and pt's son to consider (will provide resources if he's interested).  Will benefit from PT to address deficits.   Rehab Potential Good   Clinical  Impairments Affecting Rehab Potential dementia   PT Frequency 2x / week   PT Duration 6 weeks  anticipate d/c in 4-6 wks   PT Treatment/Interventions ADLs/Self Care Home Management;Cryotherapy;Moist Heat;Neuromuscular re-education;Balance training;Therapeutic exercise;Therapeutic activities;Functional mobility training;Stair training;Gait training;DME Instruction;Patient/family education;Vestibular   PT Next Visit Plan establish HEP (?OTAGO), corner balance, maybe try cane to see how pt does   Consulted and Agree with Plan of Care Patient;Family member/caregiver   Family Member Consulted son      Patient will benefit from skilled therapeutic intervention in order to improve the following deficits and impairments:  Abnormal gait, Decreased balance, Decreased strength, Difficulty walking, Decreased cognition  Visit Diagnosis: Other abnormalities of gait and mobility - Plan: PT plan of care cert/re-cert  Unsteadiness on feet - Plan: PT plan of care cert/re-cert  Muscle weakness (generalized) - Plan: PT plan of care cert/re-cert      G-Codes - 08/15/2016 1257    Functional Assessment Tool Used (Outpatient Only) clinical judgement, BERG, TUG   Functional Limitation Mobility: Walking and moving around   Mobility: Walking and Moving Around Current Status (N5621) At least 20 percent but less than 40 percent impaired, limited or restricted   Mobility: Walking and Moving Around Goal Status (H0865) At least 1 percent but less than 20 percent impaired, limited or restricted       Problem List Patient Active Problem List   Diagnosis Date Noted  . Chronic pain of right knee 05/15/2016  .  Dementia of the Alzheimer's type without behavioral disturbance 10/11/2013  . Gait instability 10/11/2013  . Memory loss 09/06/2012  . Benign paroxysmal positional vertigo 09/06/2012  . High cholesterol   . Anxiety       Clarita Crane, PT, DPT 07/23/16 12:59 PM    [facility] [address] [place], [place], [address] Phone: [phone]   Fax:  [phone]  Name: GENTRI GUARDADO MRN: [id number] Date of Birth: 1926/10/14

## 2016-07-28 ENCOUNTER — Ambulatory Visit: Payer: Medicare Other | Attending: Family | Admitting: Physical Therapy

## 2016-07-28 DIAGNOSIS — R2689 Other abnormalities of gait and mobility: Secondary | ICD-10-CM

## 2016-07-28 DIAGNOSIS — M6281 Muscle weakness (generalized): Secondary | ICD-10-CM | POA: Diagnosis present

## 2016-07-28 DIAGNOSIS — R2681 Unsteadiness on feet: Secondary | ICD-10-CM | POA: Insufficient documentation

## 2016-07-28 NOTE — Therapy (Signed)
[facility] [address] [place], [place], [address] Phone: [phone]   Fax:  [phone]  Physical Therapy Treatment  Patient Details  Name: Kelly Cross MRN: [id number] Date of Birth: 1926/10/02 Referring Provider: Adonis Huguenin, NP  Encounter Date: 07/28/2016      PT End of Session - 07/28/16 1534    Visit Number 2   Number of Visits 12   Date for PT Re-Evaluation 09/03/16   Authorization Type UHC Medicare   PT Start Time 1445   PT Stop Time 1528   PT Time Calculation (min) 43 min   Equipment Utilized During Treatment Gait belt   Activity Tolerance Patient tolerated treatment well   Behavior During Therapy [facility] for tasks assessed/performed      Past Medical History:  Diagnosis Date  . Anxiety   . High blood pressure   . High cholesterol     Past Surgical History:  Procedure Laterality Date  . AURAL ATRESIA REPAIR     1983  . BACK SURGERY     1995  . eye implants     1999  . VAGINAL HYSTERECTOMY     1995    There were no vitals filed for this visit.      Subjective Assessment - 07/28/16 1440    Subjective doing well, nothing new to report, no falls   Pertinent History dementia, HLD, anxiety   Limitations Walking   Patient Stated Goals pt unable to state; son wants to help improve pt's confidence, negotiate stairs better   Currently in Pain? No/denies                         [facility] Adult PT Treatment/Exercise - 07/28/16 1514      Ambulation/Gait   Ambulation/Gait Yes   Ambulation/Gait Assistance 5: Supervision   Ambulation/Gait Assistance Details cues for sequencing with SPC; pt unable to demonstrate carryover with sequencing; intermittent use of SPC with amb   Ambulation Distance (Feet) 250 Feet   Assistive device Straight cane   Gait Pattern Wide base of support   Ambulation Surface Level;Indoor     Exercises   Exercises Knee/Hip     Knee/Hip Exercises:  Aerobic   Nustep L4 x 10 min             Balance Exercises - 07/28/16 1446      OTAGO PROGRAM   Head Movements Sitting;5 reps   Neck Movements Sitting;5 reps   Back Extension Standing;5 reps   Trunk Movements Standing;5 reps   Ankle Movements Sitting;10 reps   Knee Extensor 10 reps   Knee Flexor 10 reps   Hip ABductor 10 reps   Ankle Plantorflexors 20 reps, support   Ankle Dorsiflexors 20 reps, support   Knee Bends 10 reps, support   Backwards Walking Support   Walking and Turning Around No assistive device   Sideways Walking No assistive device   Tandem Stance 10 seconds, support   Tandem Walk Support   One Leg Stand 10 seconds, support   Heel Walking Support   Toe Walk Support   Sit to Stand 10 reps, no support           PT Education - 07/28/16 1533    Education provided Yes   Education Details OTAGO   Person(s) Educated Patient  briefly explained program to son at end of session, son did not come back with pt   Methods Explanation  Comprehension Verbalized understanding             PT Long Term Goals - 07/28/16 1534      PT LONG TERM GOAL #1   Title verbalize /demonstrate understanding of fall prevention strategies within home environment with family. (Target Date: 09/03/16)   Status On-going     PT LONG TERM GOAL #2   Title pt/caregiver to report compliance with HEP/community fitness at least 3 days per week for continued exercise (Target Date: 09/03/16)   Status On-going     PT LONG TERM GOAL #3   Title improve BERG balance score to >/= 52/56 for improved balance (Target Date: 09/03/16)   Status On-going     PT LONG TERM GOAL #4   Title improve timed up and go to < 14 sec for improved functional mobility (Target Date: 09/03/16)   Status On-going     PT LONG TERM GOAL #5   Title negotiate at least 8 stairs with single handrail with supervision for improved household mobility (Target Date: 09/03/16)   Status On-going     PT LONG TERM GOAL #6    Title improve 5x STS to < 17 sec for improved functional strength and mobility (Target Date: 09/03/16)   Status On-going               Plan - 07/28/16 1534    Clinical Impression Statement Performed OTAGO and issued as HEP today instructing to perform every other day and walk on off days.  Attempted gait training with cane but pt with limited carryover and unsure if pt will be able to use independently.  Will continue to benefit from PT to maximize function.   PT Treatment/Interventions ADLs/Self Care Home Management;Cryotherapy;Moist Heat;Neuromuscular re-education;Balance training;Therapeutic exercise;Therapeutic activities;Functional mobility training;Stair training;Gait training;DME Instruction;Patient/family education;Vestibular   PT Next Visit Plan corner balance, gait with cane as able, balance and strengthening exercises   Consulted and Agree with Plan of Care Patient      Patient will benefit from skilled therapeutic intervention in order to improve the following deficits and impairments:  Abnormal gait, Decreased balance, Decreased strength, Difficulty walking, Decreased cognition  Visit Diagnosis: Other abnormalities of gait and mobility  Unsteadiness on feet  Muscle weakness (generalized)     Problem List Patient Active Problem List   Diagnosis Date Noted  . Chronic pain of right knee 05/15/2016  . Dementia of the Alzheimer's type without behavioral disturbance 10/11/2013  . Gait instability 10/11/2013  . Memory loss 09/06/2012  . Benign paroxysmal positional vertigo 09/06/2012  . High cholesterol   . Anxiety       Clarita Crane, PT, DPT 07/28/16 3:37 PM    [facility] [address] [place], [place], [address] Phone: [phone]   Fax:  [phone]  Name: Kelly Cross MRN: [id number] Date of Birth: 10/26/1926

## 2016-08-01 ENCOUNTER — Encounter: Payer: Self-pay | Admitting: Physical Therapy

## 2016-08-01 ENCOUNTER — Ambulatory Visit: Payer: Medicare Other | Admitting: Physical Therapy

## 2016-08-01 DIAGNOSIS — R2689 Other abnormalities of gait and mobility: Secondary | ICD-10-CM | POA: Diagnosis not present

## 2016-08-01 DIAGNOSIS — M6281 Muscle weakness (generalized): Secondary | ICD-10-CM

## 2016-08-01 DIAGNOSIS — R2681 Unsteadiness on feet: Secondary | ICD-10-CM

## 2016-08-01 NOTE — Therapy (Signed)
[facility] [address] [place], [place], [address] Phone: [phone]   Fax:  [phone]  Physical Therapy Treatment  Patient Details  Name: Kelly Cross MRN: [id number] Date of Birth: 1927/03/09 Referring Provider: Adonis Huguenin, NP  Encounter Date: 08/01/2016      PT End of Session - 08/01/16 1451    Visit Number 3   Number of Visits 12   Date for PT Re-Evaluation 09/03/16   Authorization Type UHC Medicare   PT Start Time 1408   PT Stop Time 1451   PT Time Calculation (min) 43 min   Activity Tolerance Patient tolerated treatment well   Behavior During Therapy [facility] for tasks assessed/performed      Past Medical History:  Diagnosis Date  . Anxiety   . High blood pressure   . High cholesterol     Past Surgical History:  Procedure Laterality Date  . AURAL ATRESIA REPAIR     1983  . BACK SURGERY     1995  . eye implants     1999  . VAGINAL HYSTERECTOMY     1995    There were no vitals filed for this visit.      Subjective Assessment - 08/01/16 1410    Subjective Doing well today, has been performing exercises when supervised.     Patient is accompained by: Family member   Pertinent History dementia, HLD, anxiety   Limitations Walking   Patient Stated Goals pt unable to state; son wants to help improve pt's confidence, negotiate stairs better   Currently in Pain? No/denies           Balance Exercises - 08/01/16 1451      OTAGO PROGRAM   Head Movements Sitting;5 reps   Neck Movements Sitting;5 reps   Back Extension Standing;5 reps   Trunk Movements Standing;5 reps   Ankle Movements Sitting;10 reps   Knee Extensor 10 reps   Knee Flexor 10 reps   Hip ABductor 10 reps   Ankle Plantorflexors 20 reps, support   Ankle Dorsiflexors 20 reps, support   Backwards Walking Support   Walking and Turning Around No assistive device   Sideways Walking No assistive device   Tandem Stance 10  seconds, support   Tandem Walk Support   One Leg Stand 10 seconds, support   Heel Walking Support   Toe Walk Support   Sit to Stand 10 reps, no support   Stair Walking up/down 4 stairs x 3 reps with on rail, alternating sequence   Overall OTAGO Comments Tolerated well, no seated rest breaks           PT Education - 08/01/16 1453    Education provided Yes   Education Details OTAGO   Person(s) Educated Patient   Methods Explanation   Comprehension Returned demonstration;Verbal cues required             PT Long Term Goals - 07/28/16 1534      PT LONG TERM GOAL #1   Title verbalize /demonstrate understanding of fall prevention strategies within home environment with family. (Target Date: 09/03/16)   Status On-going     PT LONG TERM GOAL #2   Title pt/caregiver to report compliance with HEP/community fitness at least 3 days per week for continued exercise (Target Date: 09/03/16)   Status On-going     PT LONG TERM GOAL #3   Title improve BERG balance score to >/= 52/56 for improved balance (Target Date: 09/03/16)  Status On-going     PT LONG TERM GOAL #4   Title improve timed up and go to < 14 sec for improved functional mobility (Target Date: 09/03/16)   Status On-going     PT LONG TERM GOAL #5   Title negotiate at least 8 stairs with single handrail with supervision for improved household mobility (Target Date: 09/03/16)   Status On-going     PT LONG TERM GOAL #6   Title improve 5x STS to < 17 sec for improved functional strength and mobility (Target Date: 09/03/16)   Status On-going               Plan - 08/01/16 1454    Clinical Impression Statement Focus today on review of OTAGO HEP to create mm memory and allow son to observe and provide cues.  Also performed stair negotiation training.  Pt tolerated well with only one seated rest break.   Clinical Impairments Affecting Rehab Potential dementia   PT Treatment/Interventions ADLs/Self Care Home  Management;Cryotherapy;Moist Heat;Neuromuscular re-education;Balance training;Therapeutic exercise;Therapeutic activities;Functional mobility training;Stair training;Gait training;DME Instruction;Patient/family education;Vestibular   PT Next Visit Plan corner balance, gait with cane as able, balance and stair negotiation training decreasing use of UE   Consulted and Agree with Plan of Care Patient   Family Member Consulted son      Patient will benefit from skilled therapeutic intervention in order to improve the following deficits and impairments:  Abnormal gait, Decreased balance, Decreased strength, Difficulty walking, Decreased cognition  Visit Diagnosis: Other abnormalities of gait and mobility  Unsteadiness on feet  Muscle weakness (generalized)     Problem List Patient Active Problem List   Diagnosis Date Noted  . Chronic pain of right knee 05/15/2016  . Dementia of the Alzheimer's type without behavioral disturbance 10/11/2013  . Gait instability 10/11/2013  . Memory loss 09/06/2012  . Benign paroxysmal positional vertigo 09/06/2012  . High cholesterol   . Anxiety    Edman CircleAudra Hall, PT, DPT 08/01/16    2:57 PM    [facility] [address] [place], [place], [address] Phone: [phone]   Fax:  [phone]  Name: Kelly Cross MRN: [id number] Date of Birth: 12-16-1926

## 2016-08-05 ENCOUNTER — Ambulatory Visit: Payer: Medicare Other | Admitting: Physical Therapy

## 2016-08-05 ENCOUNTER — Encounter: Payer: Self-pay | Admitting: Physical Therapy

## 2016-08-05 DIAGNOSIS — M6281 Muscle weakness (generalized): Secondary | ICD-10-CM

## 2016-08-05 DIAGNOSIS — R2689 Other abnormalities of gait and mobility: Secondary | ICD-10-CM

## 2016-08-05 DIAGNOSIS — R2681 Unsteadiness on feet: Secondary | ICD-10-CM

## 2016-08-06 NOTE — Therapy (Signed)
[facility] [address] [place], [place], [address] Phone: [phone]   Fax:  [phone]  Physical Therapy Treatment  Patient Details  Name: Kelly Cross MRN: [id number] Date of Birth: 10/10/1926 Referring Provider: Adonis HugueninErin R Zamora, NP  Encounter Date: 08/05/2016      PT End of Session - 08/05/16 1407    Visit Number 4   Number of Visits 12   Date for PT Re-Evaluation 09/03/16   Authorization Type UHC Medicare   PT Start Time 1403   PT Stop Time 1445   PT Time Calculation (min) 42 min   Equipment Utilized During Treatment Gait belt   Activity Tolerance Patient tolerated treatment well;No increased pain   Behavior During Therapy WFL for tasks assessed/performed      Past Medical History:  Diagnosis Date  . Anxiety   . High blood pressure   . High cholesterol     Past Surgical History:  Procedure Laterality Date  . AURAL ATRESIA REPAIR     1983  . BACK SURGERY     1995  . eye implants     1999  . VAGINAL HYSTERECTOMY     1995    There were no vitals filed for this visit.      Subjective Assessment - 08/05/16 1406    Subjective No new complaints. No falls or pain to report. OTAGO is going well "better than expected" per son's report.    Patient is accompained by: Family member   Pertinent History dementia, HLD, anxiety   Limitations Walking   Patient Stated Goals pt unable to state; son wants to help improve pt's confidence, negotiate stairs better   Currently in Pain? No/denies            [facility] PT Treatment/Exercise - 08/05/16 1408      Transfers   Transfers Sit to Stand;Stand to Sit   Number of Reps 10 reps;2 sets   Comments with simultaneous OH raises with 2# weighted ball: cues on full upright posture and for slow, controlled descent with sitting down.     Ambulation/Gait   Ambulation/Gait Yes   Ambulation/Gait Assistance 5: Supervision   Ambulation/Gait Assistance  Details cues for reciprocal arm swing   Ambulation Distance (Feet) 345 Feet   Assistive device None   Gait Pattern Step-through pattern;Decreased stride length  occasional veering   Ambulation Surface Level;Indoor   Stairs Yes   Stairs Assistance 4: Min guard   Stairs Assistance Details (indicate cue type and reason) 1 rep with 2 rails, 3 reps with left rail only. cues on posture with stair negotiation and to slow down for increased safety.    Stair Management Technique One rail Left;Two rails;Alternating pattern;Forwards   Number of Stairs 4  x 4 reps   Height of Stairs 6             Balance Exercises - 08/05/16 1417      Balance Exercises: Standing   Standing Eyes Closed Wide (BOA);Foam/compliant surface;Head turns;Other reps (comment);Limitations;Narrow base of support (BOS)   SLS with Vectors Solid surface;Other reps (comment);Limitations     Balance Exercises: Standing   Standing Eyes Closed Limitations on airex in corner with chair in front for safety: wide base of support progressing to narrow base of support with all of the following- EC no head movements, EC head movements left<>right, then up<>down. no UE support with min guard to min assist for balance.    SLS with Vectors Limitations  with 2 foam bubbles on floor:  alternating fwd toe taps to each, then alternating cross toe taps to each with min guard assist and cues on ex technique, posture and weight shifting. progressing to 2 tall cones on floor: alternating fwd toe taps to each, then alternating cross toe taps to each with min assit. cues needed to slow down with all tasks and for weight shifting to assist with balance. min guard to min assist needed.            PT Long Term Goals - 07/28/16 1534      PT LONG TERM GOAL #1   Title verbalize /demonstrate understanding of fall prevention strategies within home environment with family. (Target Date: 09/03/16)   Status On-going     PT LONG TERM GOAL #2   Title  pt/caregiver to report compliance with HEP/community fitness at least 3 days per week for continued exercise (Target Date: 09/03/16)   Status On-going     PT LONG TERM GOAL #3   Title improve BERG balance score to >/= 52/56 for improved balance (Target Date: 09/03/16)   Status On-going     PT LONG TERM GOAL #4   Title improve timed up and go to < 14 sec for improved functional mobility (Target Date: 09/03/16)   Status On-going     PT LONG TERM GOAL #5   Title negotiate at least 8 stairs with single handrail with supervision for improved household mobility (Target Date: 09/03/16)   Status On-going     PT LONG TERM GOAL #6   Title improve 5x STS to < 17 sec for improved functional strength and mobility (Target Date: 09/03/16)   Status On-going               Plan - 08/05/16 1407    Clinical Impression Statement Today's skilled session addressed gait/stairs and high level balance with no issues reported. Pt is making steady progress toward goals. Needs cues to slow down with activities for improved balance/increased safety. Pt should benefit from continued PT to progress toward unmet goals.    Clinical Impairments Affecting Rehab Potential dementia   PT Treatment/Interventions ADLs/Self Care Home Management;Cryotherapy;Moist Heat;Neuromuscular re-education;Balance training;Therapeutic exercise;Therapeutic activities;Functional mobility training;Stair training;Gait training;DME Instruction;Patient/family education;Vestibular   PT Next Visit Plan corner balance, gait with cane as able, balance and stair negotiation training decreasing use of UE   Consulted and Agree with Plan of Care Patient   Family Member Consulted son      Patient will benefit from skilled therapeutic intervention in order to improve the following deficits and impairments:  Abnormal gait, Decreased balance, Decreased strength, Difficulty walking, Decreased cognition  Visit Diagnosis: Other abnormalities of gait and  mobility  Unsteadiness on feet  Muscle weakness (generalized)     Problem List Patient Active Problem List   Diagnosis Date Noted  . Chronic pain of right knee 05/15/2016  . Dementia of the Alzheimer's type without behavioral disturbance 10/11/2013  . Gait instability 10/11/2013  . Memory loss 09/06/2012  . Benign paroxysmal positional vertigo 09/06/2012  . High cholesterol   . Anxiety     Kelly Cross, PTA, [facility] [address] [place], [place] [address] [phone] 08/06/16, 10:54 AM   Name: Kelly Cross MRN: [id number] Date of Birth: March 23, 1927

## 2016-08-08 ENCOUNTER — Ambulatory Visit: Payer: Medicare Other | Admitting: Physical Therapy

## 2016-08-08 ENCOUNTER — Encounter: Payer: Self-pay | Admitting: Physical Therapy

## 2016-08-08 DIAGNOSIS — R2689 Other abnormalities of gait and mobility: Secondary | ICD-10-CM | POA: Diagnosis not present

## 2016-08-08 DIAGNOSIS — M6281 Muscle weakness (generalized): Secondary | ICD-10-CM

## 2016-08-08 DIAGNOSIS — R2681 Unsteadiness on feet: Secondary | ICD-10-CM

## 2016-08-08 NOTE — Therapy (Signed)
[facility] [address] [place], [place], [address] Phone: [phone]   Fax:  [phone]  Physical Therapy Treatment  Patient Details  Name: Kelly Cross MRN: [id number] Date of Birth: 1926-08-23 Referring Provider: Adonis HugueninErin R Zamora, NP  Encounter Date: 08/08/2016      PT End of Session - 08/08/16 1504    Visit Number 5   Number of Visits 12   Date for PT Re-Evaluation 09/03/16   Authorization Type UHC Medicare   PT Start Time 1408   PT Stop Time 1445   PT Time Calculation (min) 37 min   Activity Tolerance Patient tolerated treatment well   Behavior During Therapy [facility] for tasks assessed/performed      Past Medical History:  Diagnosis Date  . Anxiety   . High blood pressure   . High cholesterol     Past Surgical History:  Procedure Laterality Date  . AURAL ATRESIA REPAIR     1983  . BACK SURGERY     1995  . eye implants     1999  . VAGINAL HYSTERECTOMY     1995    There were no vitals filed for this visit.      Subjective Assessment - 08/08/16 1408    Subjective No new complaints; exercises still going well at home   Patient is accompained by: Family member   Pertinent History dementia, HLD, anxiety   Limitations Walking   Patient Stated Goals pt unable to state; son wants to help improve pt's confidence, negotiate stairs better   Currently in Pain? No/denies            [facility] Treatment/Exercise - 08/08/16 1429      Ambulation/Gait   Ambulation/Gait Yes   Ambulation/Gait Assistance 5: Supervision;4: Min assist   Ambulation/Gait Assistance Details Outside over uneven pavement, grass, mulch and gravel >500' with supervision-min A.  Also performed up/down curb multiple times with min A and one max A for LOB when ascending.  Performed changes in gait speed with ambulating up to 7 seconds to "cross street."   Ambulation Distance (Feet) 500 Feet   Assistive device None   Ambulation Surface Unlevel;Outdoor;Gravel;Paved;Grass     Knee/Hip Exercises: Aerobic   Nustep L5 x 6 minutes with bilat UE and LE     Knee/Hip Exercises: Standing   Lateral Step Up Right;Left;3 sets;Hand Hold: 2;Step Height: 4";Step Height: 6";Step Height: 8"   Forward Step Up Right;Left;3 sets;Hand Hold: 2;Step Height: 4";Step Height: 6";Step Height: 8"  forward and retro step ups             Balance Exercises - 08/08/16 1506      OTAGO PROGRAM   Tandem Walk Support                PT Long Term Goals - 07/28/16 1534      PT LONG TERM GOAL #1   Title verbalize /demonstrate understanding of fall prevention strategies within home environment with family. (Target Date: 09/03/16)   Status On-going     PT LONG TERM GOAL #2   Title pt/caregiver to report compliance with HEP/community fitness at least 3 days per week for continued exercise (Target Date: 09/03/16)   Status On-going     PT LONG TERM GOAL #3   Title improve BERG balance score to >/= 52/56 for improved balance (Target Date: 09/03/16)   Status On-going     PT LONG TERM GOAL #4   Title improve timed  up and go to < 14 sec for improved functional mobility (Target Date: 09/03/16)   Status On-going     PT LONG TERM GOAL #5   Title negotiate at least 8 stairs with single handrail with supervision for improved household mobility (Target Date: 09/03/16)   Status On-going     PT LONG TERM GOAL #6   Title improve 5x STS to < 17 sec for improved functional strength and mobility (Target Date: 09/03/16)   Status On-going               Plan - 08/08/16 1505    Clinical Impression Statement Continued to focus on endurance and functional LE strengthening on Nustep and with forwards, retro and lateral step ups to various height boxes.  Also performed higher level gait training with gait outside on various surfaces and with narrow BOS.  Pt tolerated well; will continue to progress towards LTG.   PT  Treatment/Interventions ADLs/Self Care Home Management;Cryotherapy;Moist Heat;Neuromuscular re-education;Balance training;Therapeutic exercise;Therapeutic activities;Functional mobility training;Stair training;Gait training;DME Instruction;Patient/family education;Vestibular   PT Next Visit Plan corner balance, gait on variety of surfaces, balance and stair negotiation training decreasing use of UE   Consulted and Agree with Plan of Care Patient   Family Member Consulted son      Patient will benefit from skilled therapeutic intervention in order to improve the following deficits and impairments:  Abnormal gait, Decreased balance, Decreased strength, Difficulty walking, Decreased cognition  Visit Diagnosis: Other abnormalities of gait and mobility  Unsteadiness on feet  Muscle weakness (generalized)     Problem List Patient Active Problem List   Diagnosis Date Noted  . Chronic pain of right knee 05/15/2016  . Dementia of the Alzheimer's type without behavioral disturbance 10/11/2013  . Gait instability 10/11/2013  . Memory loss 09/06/2012  . Benign paroxysmal positional vertigo 09/06/2012  . High cholesterol   . Anxiety    Edman Circle, PT, DPT 08/08/16    3:08 PM    [facility] [address] [place], [place], [address] Phone: [phone]   Fax:  [phone]  Name: Kelly Cross MRN: [id number] Date of Birth: March 01, 1927

## 2016-08-12 ENCOUNTER — Encounter: Payer: Self-pay | Admitting: Physical Therapy

## 2016-08-12 ENCOUNTER — Ambulatory Visit: Payer: Medicare Other | Admitting: Physical Therapy

## 2016-08-12 DIAGNOSIS — M6281 Muscle weakness (generalized): Secondary | ICD-10-CM

## 2016-08-12 DIAGNOSIS — R2689 Other abnormalities of gait and mobility: Secondary | ICD-10-CM

## 2016-08-12 DIAGNOSIS — R2681 Unsteadiness on feet: Secondary | ICD-10-CM

## 2016-08-13 NOTE — Therapy (Signed)
[facility] [address] [place], [place], [address] Phone: [phone]   Fax:  [phone]  Physical Therapy Treatment  Patient Details  Name: Kelly Cross MRN: [id number] Date of Birth: 1926-12-04 Referring Provider: Adonis HugueninErin R Zamora, NP  Encounter Date: 08/12/2016      PT End of Session - 08/12/16 1241    Visit Number 6   Number of Visits 12   Date for PT Re-Evaluation 09/03/16   Authorization Type UHC Medicare   PT Start Time 1240  pt late for appt   PT Stop Time 1315   PT Time Calculation (min) 35 min   Equipment Utilized During Treatment Gait belt   Activity Tolerance Patient tolerated treatment well   Behavior During Therapy WFL for tasks assessed/performed      Past Medical History:  Diagnosis Date  . Anxiety   . High blood pressure   . High cholesterol     Past Surgical History:  Procedure Laterality Date  . AURAL ATRESIA REPAIR     1983  . BACK SURGERY     1995  . eye implants     1999  . VAGINAL HYSTERECTOMY     1995    There were no vitals filed for this visit.      Subjective Assessment - 08/12/16 1241    Subjective No new complaints. No falls or pain to report. Was tired after last session.    Patient is accompained by: Family member   Pertinent History dementia, HLD, anxiety   Limitations Walking   Patient Stated Goals pt unable to state; son wants to help improve pt's confidence, negotiate stairs better   Currently in Pain? No/denies             [facility] Treatment/Exercise - 08/12/16 1243      Transfers   Transfers Sit to Stand;Stand to Sit   Sit to Stand 5: Supervision;With upper extremity assist;From bed;From chair/3-in-1   Stand to Sit 5: Supervision;With upper extremity assist;To bed;To chair/3-in-1     Ambulation/Gait   Ambulation/Gait Yes   Ambulation/Gait Assistance 5: Supervision;4: Min guard   Ambulation/Gait Assistance Details verbal/visual  cues for arm swing and increase step length with gait   Ambulation Distance (Feet) 650 Feet   Assistive device None   Gait Pattern Step-through pattern;Decreased stride length   Ambulation Surface Level;Indoor;Unlevel   Ramp 5: Supervision;Other (comment)  min guard assist with 1st rep   Curb 4: Min assist   Curb Details (indicate cue type and reason) x 2 reps with cues on sequencing and technique     High Level Balance   High Level Balance Activities Side stepping;Marching forwards;Marching backwards;Tandem walking  tandem/heel/toe walking fwd/bwd   High Level Balance Comments red mats next to counter top: performed 3 laps each with intermittent light UE support on counter top for balance with min guard to min assist for balance. cues on posture and weight shifting. cues to slow down for improved balance as well.             Balance Exercises - 08/12/16 1630      Balance Exercises: Standing   SLS with Vectors Foam/compliant surface;Other reps (comment);Limitations     Balance Exercises: Standing   SLS with Vectors Limitations 6 cones along edge of red mats: min assist with single UE HHA alternating fwd toe taps to each cone with side stepping left<>right x 1 lap each way, cues to task and for weight shifting.  cues to slow down for improved balance reaction.;2 tall cones on blue mat: alternating fwd toe taps, alternating cross toe taps and alternating fwd double toe taps, 10 reps each bil legs with min to mod HHA for balance.,                         PT Long Term Goals - 07/28/16 1534      PT LONG TERM GOAL #1   Title verbalize /demonstrate understanding of fall prevention strategies within home environment with family. (Target Date: 09/03/16)   Status On-going     PT LONG TERM GOAL #2   Title pt/caregiver to report compliance with HEP/community fitness at least 3 days per week for continued exercise (Target Date: 09/03/16)   Status On-going     PT LONG TERM GOAL #3    Title improve BERG balance score to >/= 52/56 for improved balance (Target Date: 09/03/16)   Status On-going     PT LONG TERM GOAL #4   Title improve timed up and go to < 14 sec for improved functional mobility (Target Date: 09/03/16)   Status On-going     PT LONG TERM GOAL #5   Title negotiate at least 8 stairs with single handrail with supervision for improved household mobility (Target Date: 09/03/16)   Status On-going     PT LONG TERM GOAL #6   Title improve 5x STS to < 17 sec for improved functional strength and mobility (Target Date: 09/03/16)   Status On-going            Plan - 08/12/16 1242    Clinical Impression Statement Today's skilled session continued to address gait and high level balance activities. Pt is making steady progress toward goals and should benefit from continued PT to progress toward unmet goals.   Rehab Potential Good   Clinical Impairments Affecting Rehab Potential dementia   PT Frequency 2x / week   PT Duration 6 weeks   PT Treatment/Interventions ADLs/Self Care Home Management;Cryotherapy;Moist Heat;Neuromuscular re-education;Balance training;Therapeutic exercise;Therapeutic activities;Functional mobility training;Stair training;Gait training;DME Instruction;Patient/family education;Vestibular   PT Next Visit Plan corner balance, gait on variety of surfaces, balance and stair negotiation training decreasing use of UE   Consulted and Agree with Plan of Care Patient   Family Member Consulted son      Patient will benefit from skilled therapeutic intervention in order to improve the following deficits and impairments:  Abnormal gait, Decreased balance, Decreased strength, Difficulty walking, Decreased cognition  Visit Diagnosis: Other abnormalities of gait and mobility  Unsteadiness on feet  Muscle weakness (generalized)     Problem List Patient Active Problem List   Diagnosis Date Noted  . Chronic pain of right knee 05/15/2016  . Dementia of  the Alzheimer's type without behavioral disturbance 10/11/2013  . Gait instability 10/11/2013  . Memory loss 09/06/2012  . Benign paroxysmal positional vertigo 09/06/2012  . High cholesterol   . Anxiety     Sallyanne Kuster, PTA, [facility] [address], [address] [place], [place] [address] [phone] 08/13/16, 2:28 PM   Name: Kelly Cross MRN: [id number] Date of Birth: August 07, 1926

## 2016-08-15 ENCOUNTER — Ambulatory Visit: Payer: Medicare Other | Admitting: Physical Therapy

## 2016-08-18 ENCOUNTER — Encounter: Payer: Self-pay | Admitting: Physical Therapy

## 2016-08-18 ENCOUNTER — Ambulatory Visit: Payer: Medicare Other | Admitting: Physical Therapy

## 2016-08-18 DIAGNOSIS — R2689 Other abnormalities of gait and mobility: Secondary | ICD-10-CM | POA: Diagnosis not present

## 2016-08-18 DIAGNOSIS — M6281 Muscle weakness (generalized): Secondary | ICD-10-CM

## 2016-08-18 DIAGNOSIS — R2681 Unsteadiness on feet: Secondary | ICD-10-CM

## 2016-08-18 NOTE — Therapy (Signed)
[facility] [address] [place], [place], [address] Phone: [phone]   Fax:  [phone]  Physical Therapy Treatment  Patient Details  Name: Kelly BirksFloydelia F Cross MRN: [id number] Date of Birth: Aug 06, 1926 Referring Provider: Adonis HugueninErin R Zamora, NP  Encounter Date: 08/18/2016      PT End of Session - 08/18/16 1620    Visit Number 7   Number of Visits 12   Date for PT Re-Evaluation 09/03/16   Authorization Type UHC Medicare   PT Start Time 1531   PT Stop Time 1615   PT Time Calculation (min) 44 min   Activity Tolerance Patient tolerated treatment well   Behavior During Therapy [facility] for tasks assessed/performed      Past Medical History:  Diagnosis Date  . Anxiety   . High blood pressure   . High cholesterol     Past Surgical History:  Procedure Laterality Date  . AURAL ATRESIA REPAIR     1983  . BACK SURGERY     1995  . eye implants     1999  . VAGINAL HYSTERECTOMY     1995    There were no vitals filed for this visit.      Subjective Assessment - 08/18/16 1535    Subjective No issues over the weekend; came on Friday but was not seen and did not reschedule time on Friday.   Patient is accompained by: Family member   Pertinent History dementia, HLD, anxiety   Limitations Walking   Patient Stated Goals pt unable to state; son wants to help improve pt's confidence, negotiate stairs better   Currently in Pain? No/denies             [facility] Adult PT Treatment/Exercise - 08/18/16 1539      Knee/Hip Exercises: Aerobic   Nustep L5 x 8 minutes with bilat UE and LE     Knee/Hip Exercises: Standing   Hip Flexion Stengthening;Right;Left;10 reps;Knee straight   Hip Flexion Limitations with red theraband   Hip ADduction Strengthening;Right;Left;10 reps   Hip ADduction Limitations with red theraband   Hip Abduction Stengthening;Right;Left;10 reps;Knee straight;Other (comment)   Abduction Limitations with  red theraband   Hip Extension Stengthening;Right;Left;10 reps;Knee straight   Extension Limitations with red theraband             Balance Exercises - 08/18/16 1609      Balance Exercises: Standing   Rockerboard Anterior/posterior;EO;UE support;Intermittent UE support  weight shifts and then dynamic balloon taps   Retro Gait Head turns  forwards and retro x 20' x 2 with balloon taps            PT Long Term Goals - 07/28/16 1534      PT LONG TERM GOAL #1   Title verbalize /demonstrate understanding of fall prevention strategies within home environment with family. (Target Date: 09/03/16)   Status On-going     PT LONG TERM GOAL #2   Title pt/caregiver to report compliance with HEP/community fitness at least 3 days per week for continued exercise (Target Date: 09/03/16)   Status On-going     PT LONG TERM GOAL #3   Title improve BERG balance score to >/= 52/56 for improved balance (Target Date: 09/03/16)   Status On-going     PT LONG TERM GOAL #4   Title improve timed up and go to < 14 sec for improved functional mobility (Target Date: 09/03/16)   Status On-going     PT LONG TERM GOAL #  5   Title negotiate at least 8 stairs with single handrail with supervision for improved household mobility (Target Date: 09/03/16)   Status On-going     PT LONG TERM GOAL #6   Title improve 5x STS to < 17 sec for improved functional strength and mobility (Target Date: 09/03/16)   Status On-going               Plan - 08/18/16 1621    Clinical Impression Statement Treatment session today with continued focus on aerobic/endurance training, LE strengthening and dynamic balance during single limb stance and balance reactions during anterior/posterior weight shifting.  Also performed dynamic balance and gait training forwards and retro with focus on dual tasking while tapping a balloon back and forth to son.  Pt tolerated well and demonstrated some anxiety with dynamic balance activities  but able to perform with further instruction and encouragement.   Clinical Impairments Affecting Rehab Potential dementia   PT Treatment/Interventions ADLs/Self Care Home Management;Cryotherapy;Moist Heat;Neuromuscular re-education;Balance training;Therapeutic exercise;Therapeutic activities;Functional mobility training;Stair training;Gait training;DME Instruction;Patient/family education;Vestibular   PT Next Visit Plan corner balance, gait on variety of surfaces, balance and stair negotiation training decreasing use of UE   Consulted and Agree with Plan of Care Patient;Family member/caregiver   Family Member Consulted son      Patient will benefit from skilled therapeutic intervention in order to improve the following deficits and impairments:  Abnormal gait, Decreased balance, Decreased strength, Difficulty walking, Decreased cognition  Visit Diagnosis: Other abnormalities of gait and mobility  Unsteadiness on feet  Muscle weakness (generalized)     Problem List Patient Active Problem List   Diagnosis Date Noted  . Chronic pain of right knee 05/15/2016  . Dementia of the Alzheimer's type without behavioral disturbance 10/11/2013  . Gait instability 10/11/2013  . Memory loss 09/06/2012  . Benign paroxysmal positional vertigo 09/06/2012  . High cholesterol   . Anxiety     Edman Circle, PT, DPT 08/18/16    4:25 PM    [facility] [address] [place], [place], [address] Phone: [phone]   Fax:  [phone]  Name: Kelly Cross MRN: [id number] Date of Birth: [birth year]

## 2016-08-20 ENCOUNTER — Encounter: Payer: Self-pay | Admitting: Physical Therapy

## 2016-08-20 ENCOUNTER — Ambulatory Visit: Payer: Medicare Other | Admitting: Physical Therapy

## 2016-08-20 DIAGNOSIS — R2689 Other abnormalities of gait and mobility: Secondary | ICD-10-CM

## 2016-08-20 DIAGNOSIS — M6281 Muscle weakness (generalized): Secondary | ICD-10-CM

## 2016-08-20 DIAGNOSIS — R2681 Unsteadiness on feet: Secondary | ICD-10-CM

## 2016-08-20 NOTE — Therapy (Signed)
[facility] [address] [place], [place], [address] Phone: [phone]   Fax:  [phone]  Physical Therapy Treatment  Patient Details  Name: Kelly Cross MRN: [id number] Date of Birth: 04/21/1927 Referring Provider: Adonis Huguenin, NP  Encounter Date: 08/20/2016      PT End of Session - 08/20/16 1616    Visit Number 8   Number of Visits 12   Date for PT Re-Evaluation 09/03/16   Authorization Type UHC Medicare   PT Start Time 1533   PT Stop Time 1613   PT Time Calculation (min) 40 min   Activity Tolerance Patient tolerated treatment well   Behavior During Therapy [facility] for tasks assessed/performed      Past Medical History:  Diagnosis Date  . Anxiety   . High blood pressure   . High cholesterol     Past Surgical History:  Procedure Laterality Date  . AURAL ATRESIA REPAIR     1983  . BACK SURGERY     1995  . eye implants     1999  . VAGINAL HYSTERECTOMY     1995    There were no vitals filed for this visit.      Subjective Assessment - 08/20/16 1537    Subjective Doing well, was tired after last visit but son believes it was a good work out for her.  Reports pt is performing her exercises multiple times a day at home with his assistance.   Patient is accompained by: Family member   Pertinent History dementia, HLD, anxiety   Limitations Walking   Patient Stated Goals pt unable to state; son wants to help improve pt's confidence, negotiate stairs better   Currently in Pain? No/denies        \      [facility] Adult PT Treatment/Exercise - 08/20/16 1538      Ambulation/Gait   Stairs Yes   Stairs Assistance 4: Min assist   Stairs Assistance Details (indicate cue type and reason) no rails, min a for balance   Stair Management Technique No rails;Alternating pattern;Step to pattern;Forwards   Number of Stairs 12   Height of Stairs 6     Knee/Hip Exercises: Standing   Hip Flexion  Stengthening;Right;Left;Knee straight   Hip Flexion Limitations red theraband, 12 reps, one UE support   Hip ADduction Strengthening;Right;Left;Other (comment)   Hip ADduction Limitations red theraband, 12 reps, one UE support   Hip Abduction Stengthening;Right;Left;Knee straight   Abduction Limitations red theraband, 12 reps, one UE support   Hip Extension Stengthening;Right;Left;Knee straight   Extension Limitations red theraband, 12 reps, one UE support   Lateral Step Up Right;Both;Hand Hold: 0;Step Height: 6"  8 reps each side, min A for balance   Forward Step Up Right;Left;10 reps;Hand Hold: 0;Step Height: 6"  min A for balance       \\      Balance Exercises - 08/20/16 1600      OTAGO PROGRAM   Backwards Walking No support   Tandem Walk Support   Heel Walking No support   Toe Walk No support   Sit to Stand 10 reps, no support  15 reps   Overall OTAGO Comments high knee marching x 2 no support      \      PT Long Term Goals - 07/28/16 1534      PT LONG TERM GOAL #1   Title verbalize /demonstrate understanding of fall prevention strategies within home environment with  family. (Target Date: 09/03/16)   Status On-going     PT LONG TERM GOAL #2   Title pt/caregiver to report compliance with HEP/community fitness at least 3 days per week for continued exercise (Target Date: 09/03/16)   Status On-going     PT LONG TERM GOAL #3   Title improve BERG balance score to >/= 52/56 for improved balance (Target Date: 09/03/16)   Status On-going     PT LONG TERM GOAL #4   Title improve timed up and go to < 14 sec for improved functional mobility (Target Date: 09/03/16)   Status On-going     PT LONG TERM GOAL #5   Title negotiate at least 8 stairs with single handrail with supervision for improved household mobility (Target Date: 09/03/16)   Status On-going     PT LONG TERM GOAL #6   Title improve 5x STS to < 17 sec for improved functional strength and mobility (Target Date:  09/03/16)   Status On-going               Plan - 08/20/16 1617    Clinical Impression Statement Treatment session today with continued focus on dynamic standing balance, weight shifting and LE strengthening for increased safety and confidence with stair negotiation and gait without UE support.  Pt tolerated well but did continue to require min A for standing balance activities without UE support.  Will continue to address.     Clinical Impairments Affecting Rehab Potential dementia   PT Treatment/Interventions ADLs/Self Care Home Management;Cryotherapy;Moist Heat;Neuromuscular re-education;Balance training;Therapeutic exercise;Therapeutic activities;Functional mobility training;Stair training;Gait training;DME Instruction;Patient/family education;Vestibular   PT Next Visit Plan corner balance, gait on variety of surfaces, balance and stair negotiation training decreasing use of UE   Consulted and Agree with Plan of Care Patient   Family Member Consulted son      Patient will benefit from skilled therapeutic intervention in order to improve the following deficits and impairments:  Abnormal gait, Decreased balance, Decreased strength, Difficulty walking, Decreased cognition  Visit Diagnosis: Other abnormalities of gait and mobility  Unsteadiness on feet  Muscle weakness (generalized)     Problem List Patient Active Problem List   Diagnosis Date Noted  . Chronic pain of right knee 05/15/2016  . Dementia of the Alzheimer's type without behavioral disturbance 10/11/2013  . Gait instability 10/11/2013  . Memory loss 09/06/2012  . Benign paroxysmal positional vertigo 09/06/2012  . High cholesterol   . Anxiety    Edman CircleAudra Hall, PT, DPT 08/20/16    4:20 PM    [facility] [address] [place], [place], [address] Phone: [phone]   Fax:  [phone]  Name: Kelly Cross MRN: [id number] Date of Birth:  Mar 20, 1927

## 2016-08-25 ENCOUNTER — Ambulatory Visit: Payer: Medicare Other | Admitting: Physical Therapy

## 2016-08-26 ENCOUNTER — Ambulatory Visit: Payer: Medicare Other | Attending: Family | Admitting: Physical Therapy

## 2016-08-26 ENCOUNTER — Encounter: Payer: Self-pay | Admitting: Physical Therapy

## 2016-08-26 DIAGNOSIS — R2689 Other abnormalities of gait and mobility: Secondary | ICD-10-CM

## 2016-08-26 DIAGNOSIS — M6281 Muscle weakness (generalized): Secondary | ICD-10-CM | POA: Diagnosis present

## 2016-08-26 DIAGNOSIS — R2681 Unsteadiness on feet: Secondary | ICD-10-CM

## 2016-08-26 NOTE — Therapy (Signed)
[facility] [address] [place], [place], [address] Phone: [phone]   Fax:  [phone]  Physical Therapy Treatment  Patient Details  Name: Kelly Cross MRN: [id number] Date of Birth: 1926-09-12 Referring Provider: Adonis Huguenin, NP  Encounter Date: 08/26/2016      PT End of Session - 08/26/16 1240    Visit Number 9   Number of Visits 12   Date for PT Re-Evaluation 09/03/16   Authorization Type UHC Medicare   PT Start Time 1149   PT Stop Time 1230   PT Time Calculation (min) 41 min   Activity Tolerance Patient tolerated treatment well   Behavior During Therapy [facility] for tasks assessed/performed      Past Medical History:  Diagnosis Date  . Anxiety   . High blood pressure   . High cholesterol     Past Surgical History:  Procedure Laterality Date  . AURAL ATRESIA REPAIR     1983  . BACK SURGERY     1995  . eye implants     1999  . VAGINAL HYSTERECTOMY     1995    There were no vitals filed for this visit.      Subjective Assessment - 08/26/16 1153    Subjective Doing well today; no issues.  Exercises are still going well-may be too strenuous per son's report.  Pt is performing all exercises in one set in afternoon after full day of activities-cooking, getting dressed, etc.  May tolerate better if she splits exercises in half (half in am, half in pm).     Patient is accompained by: Family member   Pertinent History dementia, HLD, anxiety   Limitations Walking   Patient Stated Goals pt unable to state; son wants to help improve pt's confidence, negotiate stairs better   Currently in Pain? No/denies            [facility] Adult PT Treatment/Exercise - 08/26/16 1156      Knee/Hip Exercises: Aerobic   Nustep L4 x 10 minutes with bilat UE and LE           Balance Exercises - 08/26/16 1237      Balance Exercises: Standing   Standing Eyes Opened Narrow base of support (BOS);Head  turns;Foam/compliant surface  vertical and horizontal head turns, 10x   Tandem Gait Upper extremity support;Foam/compliant surface;4 reps   Retro Gait Foam/compliant surface;4 reps   Sidestepping Foam/compliant support;4 reps   Marching Limitations on compliant surface, x 4 reps high knee marches, no UE support     OTAGO PROGRAM   Heel Walking Support  on compliant surface   Toe Walk Support  on compliant surface           PT Education - 08/26/16 1239    Education provided Yes   Education Details discussed with son POC, # of visits, re-assessment of goals and decision making process for recertification vs. D/C with community wellness plan and HEP   Person(s) Educated Patient;Child(ren)   Methods Explanation   Comprehension Verbalized understanding             PT Long Term Goals - 07/28/16 1534      PT LONG TERM GOAL #1   Title verbalize /demonstrate understanding of fall prevention strategies within home environment with family. (Target Date: 09/03/16)   Status On-going     PT LONG TERM GOAL #2   Title pt/caregiver to report compliance with HEP/community fitness at least 3 days per  week for continued exercise (Target Date: 09/03/16)   Status On-going     PT LONG TERM GOAL #3   Title improve BERG balance score to >/= 52/56 for improved balance (Target Date: 09/03/16)   Status On-going     PT LONG TERM GOAL #4   Title improve timed up and go to < 14 sec for improved functional mobility (Target Date: 09/03/16)   Status On-going     PT LONG TERM GOAL #5   Title negotiate at least 8 stairs with single handrail with supervision for improved household mobility (Target Date: 09/03/16)   Status On-going     PT LONG TERM GOAL #6   Title improve 5x STS to < 17 sec for improved functional strength and mobility (Target Date: 09/03/16)   Status On-going               Plan - 08/26/16 1240    Clinical Impression Statement Continued to focused treatment session on  progressing endurance training, LE strengthening and progression of dynamic balance exercises to include compliant surface.  Pt tolerated well.  Discussed # of visits with pt's son, pt's progress and possible D/C after next week.  Son is concerned that when pt is done with therapy she will experience a decline in function and will not be as motivated to perform HEP at home.  Discussed various community wellness options and incorporating HEP into daily routine after D/C.   Clinical Impairments Affecting Rehab Potential dementia   PT Treatment/Interventions ADLs/Self Care Home Management;Cryotherapy;Moist Heat;Neuromuscular re-education;Balance training;Therapeutic exercise;Therapeutic activities;Functional mobility training;Stair training;Gait training;DME Instruction;Patient/family education;Vestibular   PT Next Visit Plan 10 visit G code and progress note next visit!!  End of cert period is 4/11-please have son schedule two visits for next week (through 4/11) and then we will decide to recert vs. D/C; continue to focus on dynamic balance, LE strength, stair negotiation   Consulted and Agree with Plan of Care Patient   Family Member Consulted son      Patient will benefit from skilled therapeutic intervention in order to improve the following deficits and impairments:  Abnormal gait, Decreased balance, Decreased strength, Difficulty walking, Decreased cognition  Visit Diagnosis: Other abnormalities of gait and mobility  Unsteadiness on feet  Muscle weakness (generalized)     Problem List Patient Active Problem List   Diagnosis Date Noted  . Chronic pain of right knee 05/15/2016  . Dementia of the Alzheimer's type without behavioral disturbance 10/11/2013  . Gait instability 10/11/2013  . Memory loss 09/06/2012  . Benign paroxysmal positional vertigo 09/06/2012  . High cholesterol   . Anxiety     Edman Circle, PT, DPT 08/26/16    12:46 PM    [facility] [address] [place], [place], [address] Phone: [phone]   Fax:  [phone]  Name: Kelly Cross MRN: [id number] Date of Birth: 02-Dec-1926

## 2016-08-28 ENCOUNTER — Ambulatory Visit: Payer: Medicare Other

## 2016-09-11 ENCOUNTER — Encounter: Payer: Self-pay | Admitting: Physical Therapy

## 2016-09-11 ENCOUNTER — Ambulatory Visit: Payer: Medicare Other | Admitting: Physical Therapy

## 2016-09-11 DIAGNOSIS — R2689 Other abnormalities of gait and mobility: Secondary | ICD-10-CM | POA: Diagnosis not present

## 2016-09-11 DIAGNOSIS — R2681 Unsteadiness on feet: Secondary | ICD-10-CM

## 2016-09-11 DIAGNOSIS — M6281 Muscle weakness (generalized): Secondary | ICD-10-CM

## 2016-09-11 NOTE — Therapy (Signed)
[facility] [address] [place], [place], [address] Phone: [phone]   Fax:  [phone]  Physical Therapy Treatment  Patient Details  Name: Kelly Cross MRN: [id number] Date of Birth: [birth year] Referring Provider: Suzan Slick, NP  Encounter Date: 09/11/2016      PT End of Session - 09/11/16 1032    Visit Number 10   Number of Visits 24  12 + 12 per recertification   Date for PT Re-Evaluation 67/34/19  per recertification for 6 more weeks   Authorization Type U[employer] [place] code, progress note every 10th visit   PT Start Time 0935   PT Stop Time 1020   PT Time Calculation (min) 45 min   Activity Tolerance Patient tolerated treatment well   Behavior During Therapy [facility] for tasks assessed/performed      Past Medical History:  Diagnosis Date  . Anxiety   . High blood pressure   . High cholesterol     Past Surgical History:  Procedure Laterality Date  . AURAL ATRESIA REPAIR     1983  . BACK SURGERY     1995  . eye implants     1999  . VAGINAL HYSTERECTOMY     1995    There were no vitals filed for this visit.      Subjective Assessment - 09/11/16 1011    Subjective Son reports pt has been slowing down, low energy.  Feels that coming to therapy and getting out of the house helps patient's energy and mentation.   Patient is accompained by: Family member   Pertinent History dementia, HLD, anxiety   Limitations Walking   Patient Stated Goals pt unable to state; son wants to help improve pt's confidence, negotiate stairs better   Currently in Pain? No/denies            [facility] PT Assessment - 09/11/16 0942      Ambulation/Gait   Stairs Yes   Stairs Assistance 5: Supervision   Stair Management Technique One rail Right;Alternating pattern;Forwards   Number of Stairs 12   Height of Stairs 6     Standardized Balance Assessment   Standardized Balance Assessment Berg Balance Test;Timed  Up and Go Test;Five Times Sit to Stand   Five times sit to stand comments  12.66 seconds     Berg Balance Test   Sit to Stand Able to stand without using hands and stabilize independently   Standing Unsupported Able to stand safely 2 minutes   Sitting with Back Unsupported but Feet Supported on Floor or Stool Able to sit safely and securely 2 minutes   Stand to Sit Sits safely with minimal use of hands   Transfers Able to transfer safely, minor use of hands   Standing Unsupported with Eyes Closed Able to stand 10 seconds safely   Standing Ubsupported with Feet Together Able to place feet together independently and stand 1 minute safely   From Standing, Reach Forward with Outstretched Arm Can reach confidently >25 cm (10")   From Standing Position, Pick up Object from Floor Able to pick up shoe safely and easily   From Standing Position, Turn to Look Behind Over each Shoulder Looks behind from both sides and weight shifts well   Turn 360 Degrees Able to turn 360 degrees safely but slowly   Standing Unsupported, Alternately Place Feet on Step/Stool Able to stand independently and safely and complete 8 steps in 20 seconds   Standing Unsupported, One Foot  in Front Able to take small step independently and hold 30 seconds   Standing on One Leg Able to lift leg independently and hold 5-10 seconds   Total Score 51   Berg comment: 51/56     Timed Up and Go Test   TUG Normal TUG   Normal TUG (seconds) 14.53                     [facility] Adult PT Treatment/Exercise - 09/11/16 0942      High Level Balance   High Level Balance Activities Braiding;Tandem walking   High Level Balance Comments 4 reps x 12 feet; braiding forwards and then behind, tandem gait forwards and retro with HHA                PT Education - 09/11/16 1031    Education provided Yes   Education Details Progress made, continued areas of impairment and focus for therapy, POC   Person(s) Educated  Patient;Child(ren)   Methods Explanation   Comprehension Verbalized understanding             PT Long Term Goals - 09/11/16 0941      PT LONG TERM GOAL #1   Title verbalize /demonstrate understanding of fall prevention strategies within home environment with family. (Target Date: 09/03/16; NEW TARGET DATE FOR ALL LTG 11/01/16)   Time 6   Period Days   Status On-going     PT LONG TERM GOAL #2   Title pt/caregiver to report compliance with HEP/community fitness at least 3 days per week for continued exercise (Target Date: 09/03/16; 11/01/16)   Time 6   Period Weeks   Status On-going     PT LONG TERM GOAL #3   Title improve BERG balance score to >/= 52/56 for improved balance (Target Date: 09/03/16; 11/01/16)   Baseline 51/56 progressing   Time 6   Period Weeks   Status On-going     PT LONG TERM GOAL #4   Title improve timed up and go to < 13 sec for improved functional mobility (Target Date: 09/03/16; 11/01/16)   Baseline 14.53, progressing   Time 6   Period Weeks   Status Revised     PT LONG TERM GOAL #5   Title negotiate at least 8 stairs with single handrail with supervision for improved household mobility (Target Date: 09/03/16)   Baseline met 09/11/16   Status Achieved     Additional Long Term Goals   Additional Long Term Goals Yes     PT LONG TERM GOAL #6   Title improve 5x STS to < 17 sec for improved functional strength and mobility (Target Date: 09/03/16)   Baseline 12.66 met 09/11/16   Status Achieved     PT LONG TERM GOAL #7   Title Gait velocity LTG TBD at next visit               Plan - 09/11/16 1033    Clinical Impression Statement Pt is making good progress towards LTG and has met 2/5 LTG.  Pt continues to present with impairments in dynamic LE strength, balance and balance reactions during narrow BOS and during changes in direction, impaired gait and endurance.  Pt would benefit from ongoing PT services x 6 more weeks to continue to address impairments  and to continue to decrease falls risk and make progress towards LTG.   Rehab Potential Good   Clinical Impairments Affecting Rehab Potential dementia   PT Frequency 2x /  week   PT Duration 6 weeks   PT Treatment/Interventions ADLs/Self Care Home Management;Cryotherapy;Moist Heat;Neuromuscular re-education;Balance training;Therapeutic exercise;Therapeutic activities;Functional mobility training;Stair training;Gait training;DME Instruction;Patient/family education;Vestibular   PT Next Visit Plan pt recerted for 6 more weeks, assess gait velocity and set LTG; continue to focus on high level balance, gait on variety of surfaces/obstacles, endurance, LE strengthening   Consulted and Agree with Plan of Care Patient   Family Member Consulted son      Patient will benefit from skilled therapeutic intervention in order to improve the following deficits and impairments:  Abnormal gait, Decreased balance, Decreased strength, Difficulty walking, Decreased cognition  Visit Diagnosis: Other abnormalities of gait and mobility  Unsteadiness on feet  Muscle weakness (generalized)       G-Codes - 09-25-2016 1039    Functional Assessment Tool Used (Outpatient Only) BERG, TUG, 5 times sit to stand   Functional Limitation Mobility: Walking and moving around   Mobility: Walking and Moving Around Current Status [id number]) At least 1 percent but less than 20 percent impaired, limited or restricted   Mobility: Walking and Moving Around Goal Status [phone]) At least 1 percent but less than 20 percent impaired, limited or restricted      Problem List Patient Active Problem List   Diagnosis Date Noted  . Chronic pain of right knee 05/15/2016  . Dementia of the Alzheimer's type without behavioral disturbance 10/11/2013  . Gait instability 10/11/2013  . Memory loss 09/06/2012  . Benign paroxysmal positional vertigo 09/06/2012  . High cholesterol   . Anxiety    Physical Therapy Progress Note  Dates of  Reporting Period: 07/23/16 to 09/25/2016   Objective Reports of Subjective Statement: See above  Objective Measurements: BERG, TUG, five times sit to stand above  Goal Update: See above  Plan: Recertify POC x 6 more weeks  Reason Skilled Services are Required: See above   Raylene Everts, PT, DPT Sep 25, 2016    10:44 AM    [facility] [address] [place], [place], [address] Phone: [phone]   Fax:  [phone]  Name: Kelly Cross MRN: [id number] Date of Birth: [birth year]

## 2016-09-15 ENCOUNTER — Ambulatory Visit: Payer: Medicare Other | Admitting: Physical Therapy

## 2016-09-15 ENCOUNTER — Encounter: Payer: Self-pay | Admitting: Physical Therapy

## 2016-09-15 DIAGNOSIS — R2689 Other abnormalities of gait and mobility: Secondary | ICD-10-CM

## 2016-09-15 DIAGNOSIS — M6281 Muscle weakness (generalized): Secondary | ICD-10-CM

## 2016-09-15 DIAGNOSIS — R2681 Unsteadiness on feet: Secondary | ICD-10-CM

## 2016-09-15 NOTE — Therapy (Signed)
[facility] [address] [place], [place], [address] Phone: [phone]   Fax:  [phone]  Physical Therapy Treatment  Patient Details  Name: Kelly Cross MRN: [id number] Date of Birth: June 22, 1926 Referring Provider: Suzan Slick, NP  Encounter Date: 09/15/2016      PT End of Session - 09/15/16 1300    Visit Number 11   Number of Visits 24  12 + 12 per recertification   Date for PT Re-Evaluation 88/89/16  per recertification for 6 more weeks   Authorization Type [employer] [place] code, progress note every 10th visit   PT Start Time 1108   PT Stop Time 1150   PT Time Calculation (min) 42 min   Activity Tolerance Patient tolerated treatment well   Behavior During Therapy [facility] for tasks assessed/performed      Past Medical History:  Diagnosis Date  . Anxiety   . High blood pressure   . High cholesterol     Past Surgical History:  Procedure Laterality Date  . AURAL ATRESIA REPAIR     1983  . BACK SURGERY     1995  . eye implants     1999  . VAGINAL HYSTERECTOMY     1995    There were no vitals filed for this visit.      Subjective Assessment - 09/15/16 1112    Subjective Son reports pt had a good weekend; her other son was home and got to visit with him.   Patient is accompained by: Family member   Pertinent History dementia, HLD, anxiety   Limitations Walking   Patient Stated Goals pt unable to state; son wants to help improve pt's confidence, negotiate stairs better   Currently in Pain? No/denies                         [facility] Adult PT Treatment/Exercise - 09/15/16 1118      Knee/Hip Exercises: Standing   Wall Squat 3 sets;Other (comment)  15 seconds with ball squeeze ADD     Knee/Hip Exercises: Supine   Bridges with Diona Foley Squeeze Strengthening;Both;1 set;10 reps   Bridges with Clamshell Strengthening;Both;1 set;10 reps  green theraband   Single Leg Bridge  Strengthening;Right;Left;1 set;5 reps  partial single leg; one foot on balance disc             Balance Exercises - 09/15/16 1141      Balance Exercises: Standing   Standing Eyes Opened Narrow base of support (BOS);Head turns;4 reps;Other (comment)  10 head turns, partial SLS, 1 foot on balance disc   Standing Eyes Closed Narrow base of support (BOS);Solid surface;2 reps  2 reps each feet together, partial tandem R and LLE forwards   Step Ups Lateral;4 inch;UE support 1  on compliant foam with single limb stance                PT Long Term Goals - 09/11/16 0941      PT LONG TERM GOAL #1   Title verbalize /demonstrate understanding of fall prevention strategies within home environment with family. (Target Date: 09/03/16; NEW TARGET DATE FOR ALL LTG 11/01/16)   Time 6   Period Days   Status On-going     PT LONG TERM GOAL #2   Title pt/caregiver to report compliance with HEP/community fitness at least 3 days per week for continued exercise (Target Date: 09/03/16; 11/01/16)   Time 6   Period [place]  Status On-going     PT LONG TERM GOAL #3   Title improve BERG balance score to >/= 52/56 for improved balance (Target Date: 09/03/16; 11/01/16)   Baseline 51/56 progressing   Time 6   Period Weeks   Status On-going     PT LONG TERM GOAL #4   Title improve timed up and go to < 13 sec for improved functional mobility (Target Date: 09/03/16; 11/01/16)   Baseline 14.53, progressing   Time 6   Period Weeks   Status Revised     PT LONG TERM GOAL #5   Title negotiate at least 8 stairs with single handrail with supervision for improved household mobility (Target Date: 09/03/16)   Baseline met 09/11/16   Status Achieved     Additional Long Term Goals   Additional Long Term Goals Yes     PT LONG TERM GOAL #6   Title improve 5x STS to < 17 sec for improved functional strength and mobility (Target Date: 09/03/16)   Baseline 12.66 met 09/11/16   Status Achieved     PT LONG TERM GOAL  #7   Title Gait velocity LTG TBD at next visit               Plan - 09/15/16 1300    Clinical Impression Statement Focused session today on progressive LE strengthening and proximal stability training with bridges and wall squats + addition of clamshells and ball squeezes for hip ABD and ADD strengthening.  Also performed multi-sensory balance training in corner with narrow BOS and decreased reliance on vision (eyes closed and head turns); performed step ups on compliant surface for weight shifting and balance reaction training.  Pt tolerated well but was fatigued at end of session.   Rehab Potential Good   Clinical Impairments Affecting Rehab Potential dementia   PT Treatment/Interventions ADLs/Self Care Home Management;Cryotherapy;Moist Heat;Neuromuscular re-education;Balance training;Therapeutic exercise;Therapeutic activities;Functional mobility training;Stair training;Gait training;DME Instruction;Patient/family education;Vestibular   PT Next Visit Plan Assess gait velocity and set LTG; continue to focus on high level balance, gait on variety of surfaces/obstacles, endurance, LE strengthening   Consulted and Agree with Plan of Care Patient   Family Member Consulted son      Patient will benefit from skilled therapeutic intervention in order to improve the following deficits and impairments:  Abnormal gait, Decreased balance, Decreased strength, Difficulty walking, Decreased cognition  Visit Diagnosis: Other abnormalities of gait and mobility  Unsteadiness on feet  Muscle weakness (generalized)     Problem List Patient Active Problem List   Diagnosis Date Noted  . Chronic pain of right knee 05/15/2016  . Dementia of the Alzheimer's type without behavioral disturbance 10/11/2013  . Gait instability 10/11/2013  . Memory loss 09/06/2012  . Benign paroxysmal positional vertigo 09/06/2012  . High cholesterol   . Anxiety    Raylene Everts, PT, DPT 09/15/16    1:12  PM    [facility] [address] [place], [place], [address] Phone: [phone]   Fax:  [phone]  Name: Kelly Cross MRN: [id number] Date of Birth: [birth year]

## 2016-09-19 ENCOUNTER — Ambulatory Visit: Payer: Medicare Other | Admitting: Physical Therapy

## 2016-09-19 DIAGNOSIS — R2681 Unsteadiness on feet: Secondary | ICD-10-CM

## 2016-09-19 DIAGNOSIS — R2689 Other abnormalities of gait and mobility: Secondary | ICD-10-CM

## 2016-09-19 NOTE — Therapy (Signed)
[facility] [address] [place], [place], [address] Phone: [phone]   Fax:  [phone]  Physical Therapy Treatment  Patient Details  Name: Kelly Cross MRN: [id number] Date of Birth: [birth year] Referring Provider: Adonis Huguenin, NP  Encounter Date: 09/19/2016      PT End of Session - 09/19/16 1219    Visit Number 12   Number of Visits 24  12 + 12 per recertification   Date for PT Re-Evaluation 10/26/16  per recertification for 6 more weeks   Authorization Type [employer] Medicare-G code, progress note every 10th visit   PT Start Time 1018   PT Stop Time 1102   PT Time Calculation (min) 44 min   Equipment Utilized During Treatment Gait belt   Activity Tolerance Patient tolerated treatment well   Behavior During Therapy WFL for tasks assessed/performed      Past Medical History:  Diagnosis Date  . Anxiety   . High blood pressure   . High cholesterol     Past Surgical History:  Procedure Laterality Date  . AURAL ATRESIA REPAIR     1983  . BACK SURGERY     1995  . eye implants     1999  . VAGINAL HYSTERECTOMY     1995    There were no vitals filed for this visit.      Subjective Assessment - 09/19/16 1021    Subjective Son reports no falls recently.  No changes since last visit.   Patient is accompained by: Family member   Pertinent History dementia, HLD, anxiety   Limitations Walking   Patient Stated Goals pt unable to state; son wants to help improve pt's confidence, negotiate stairs better   Currently in Pain? No/denies                         [facility] Adult PT Treatment/Exercise - 09/19/16 0001      Ambulation/Gait   Ambulation/Gait Yes   Ambulation/Gait Assistance 5: Supervision;4: Min guard   Ambulation/Gait Assistance Details verbal cues for looking at visual targe for improved posture with gait   Ambulation Distance (Feet) 200 Feet  then 500 ft   Assistive device  None   Gait Pattern Step-through pattern;Decreased stride length;Wide base of support   Ambulation Surface Level;Indoor   Gait velocity 11.50 sec = 2.85 ft/sec   Gait Comments GAit activities at end of session with attempts to incorporate head turns for environmental scanning, as well as turns and changes of direction.  Pt slows slightly with head turns and pt brings arms into mid-guard posture when negotiating narrow spaces.             Balance Exercises - 09/19/16 1035      Balance Exercises: Standing   Standing Eyes Opened Wide (BOA);Narrow base of support (BOS);Foam/compliant surface;Head turns  Head nods, 10 reps each   Standing Eyes Closed Wide (BOA);Narrow base of support (BOS);Foam/compliant surface;1 rep;10 secs   Tandem Stance Eyes open;Foam/compliant surface;3 reps   Step Ups Lateral;4 inch;UE support 1;Forward  compliant surface x 10 reps   Tandem Gait Upper extremity support;Foam/compliant surface;4 reps  Length of counter, min guard assistance   Partial Tandem Stance Eyes open;Foam/compliant surface;Upper extremity support 1;Upper extremity support 2  Head turns/head nods x 5 reps    Retro Gait Foam/compliant surface;4 reps   Sidestepping Foam/compliant support;4 reps   Marching Limitations marching in place on compliant surface x 10 reps  Other Standing Exercises Standign on compliant mat:  Forward step down x 10 reps, then forward kicks x 10 reps      Pt requires min guard/close supervision for all standing balance activities on compliant surfaces.  Requires 2 seated breaks during therapy.           PT Long Term Goals - 09/19/16 1221      PT LONG TERM GOAL #1   Title verbalize /demonstrate understanding of fall prevention strategies within home environment with family. (Target Date: 09/03/16; NEW TARGET DATE FOR ALL LTG 11/01/16)   Time 6   Period Days   Status On-going     PT LONG TERM GOAL #2   Title pt/caregiver to report compliance with  HEP/community fitness at least 3 days per week for continued exercise (Target Date: 09/03/16; 11/01/16)   Time 6   Period Weeks   Status On-going     PT LONG TERM GOAL #3   Title improve BERG balance score to >/= 52/56 for improved balance (Target Date: 09/03/16; 11/01/16)   Baseline 51/56 progressing   Time 6   Period Weeks   Status On-going     PT LONG TERM GOAL #4   Title improve timed up and go to < 13 sec for improved functional mobility (Target Date: 09/03/16; 11/01/16)   Baseline 14.53, progressing   Time 6   Period Weeks   Status Revised     PT LONG TERM GOAL #5   Title negotiate at least 8 stairs with single handrail with supervision for improved household mobility (Target Date: 09/03/16)   Baseline met 09/11/16   Status Achieved     PT LONG TERM GOAL #6   Title improve 5x STS to < 17 sec for improved functional strength and mobility (Target Date: 09/03/16)   Baseline 12.66 met 09/11/16   Status Achieved     PT LONG TERM GOAL #7   Title Gait velocity to increase to at least 3 ft/sec for improved gait efficiency and safety.  TARget 11/01/16   Baseline 2.85 ft/sec on 09/19/16   Time 6   Status New               Plan - 09/19/16 1220    Clinical Impression Statement Addressed balance on compliant surfaces with static activities with head turns, as well as dynamic activities.  Pt tends to have widened BOS during balance and gait activities, increased L lateral lean noted with balance instability with tandem and partial tandem standing activities.   Rehab Potential Good   Clinical Impairments Affecting Rehab Potential dementia   PT Frequency 2x / week   PT Duration 6 weeks   PT Treatment/Interventions ADLs/Self Care Home Management;Cryotherapy;Moist Heat;Neuromuscular re-education;Balance training;Therapeutic exercise;Therapeutic activities;Functional mobility training;Stair training;Gait training;DME Instruction;Patient/family education;Vestibular   PT Next Visit Plan continue  to focus on high level balance, gait on variety of surfaces/obstacles, endurance, LE strengthening   Consulted and Agree with Plan of Care Patient   Family Member Consulted son      Patient will benefit from skilled therapeutic intervention in order to improve the following deficits and impairments:  Abnormal gait, Decreased balance, Decreased strength, Difficulty walking, Decreased cognition  Visit Diagnosis: Other abnormalities of gait and mobility  Unsteadiness on feet     Problem List Patient Active Problem List   Diagnosis Date Noted  . Chronic pain of right knee 05/15/2016  . Dementia of the Alzheimer's type without behavioral disturbance 10/11/2013  . Gait instability 10/11/2013  .  Memory loss 09/06/2012  . Benign paroxysmal positional vertigo 09/06/2012  . High cholesterol   . Anxiety     Markayla Reichart W. 09/19/2016, 12:24 PM Gean Maidens., PT [place] [facility] [address] [place], [place], [address] Phone: [phone]   Fax:  [phone]  Name: CALLI TEELE MRN: [id number] Date of Birth: 12/09/1926

## 2016-09-24 ENCOUNTER — Ambulatory Visit: Payer: Medicare Other | Attending: Family

## 2016-09-24 DIAGNOSIS — M6281 Muscle weakness (generalized): Secondary | ICD-10-CM | POA: Diagnosis present

## 2016-09-24 DIAGNOSIS — R2681 Unsteadiness on feet: Secondary | ICD-10-CM | POA: Insufficient documentation

## 2016-09-24 DIAGNOSIS — R2689 Other abnormalities of gait and mobility: Secondary | ICD-10-CM | POA: Insufficient documentation

## 2016-09-24 NOTE — Therapy (Signed)
[facility] [address] [place], [place], [address] Phone: [phone]   Fax:  [phone]  Physical Therapy Treatment  Patient Details  Name: Kelly Cross MRN: [id number] Date of Birth: Feb 26, 1927 Referring Provider: Suzan Slick, NP  Encounter Date: 09/24/2016      PT End of Session - 09/24/16 1218    Visit Number 13   Number of Visits 24   Date for PT Re-Evaluation 10/26/16   Authorization Type [employer] [place] code, progress note every 10th visit   PT Start Time 1105   PT Stop Time 1144   PT Time Calculation (min) 39 min   Equipment Utilized During Treatment --  min A to S   Activity Tolerance Patient tolerated treatment well   Behavior During Therapy [facility] for tasks assessed/performed      Past Medical History:  Diagnosis Date  . Anxiety   . High blood pressure   . High cholesterol     Past Surgical History:  Procedure Laterality Date  . AURAL ATRESIA REPAIR     1983  . BACK SURGERY     1995  . eye implants     1999  . VAGINAL HYSTERECTOMY     1995    There were no vitals filed for this visit.      Subjective Assessment - 09/24/16 1108    Subjective Son reports pt is having difficulty remembering all of the exercises. He denied falls since last visit.    Patient is accompained by: Family member   Pertinent History dementia, HLD, anxiety   Patient Stated Goals pt unable to state; son wants to help improve pt's confidence, negotiate stairs better   Currently in Pain? No/denies                         [facility] Adult PT Treatment/Exercise - 09/24/16 1113      Ambulation/Gait   Ambulation/Gait Yes   Ambulation/Gait Assistance 5: Supervision;4: Min guard   Ambulation/Gait Assistance Details Pt amb. Indoors, with pt negotiating around chairs. Cues to avoid obstacles, improve wide BOS and heel strike.    Ambulation Distance (Feet) 200 Feet  50'x6   Assistive device None   Gait Pattern Step-through pattern;Decreased stride length;Wide base of support   Ambulation Surface Level;Indoor   Gait velocity 2.92f/sec.     High Level Balance   High Level Balance Activities Side stepping;Backward walking;Turns;Head turns;Marching forwards;Negotitating around obstacles;Negotiating over obstacles;Other (comment)  forward amb. on compliant surfaces   High Level Balance Comments Performed all balance activiites over compliant surfaces, accept for negotiating obstacles and around obstacles. min gaurd to S to ensure safety. PT provided cues and demo for proper technique. Each activity perform 4x15'.             Balance Exercises - 09/24/16 1217      Balance Exercises: Standing   Other Standing Exercises Pt performed single, double, and knock down/turn upright with cones 4x4cones/LE. cues to improve technique and lateral wt. shifting and min guard to min A to maintain balance.                 PT Long Term Goals - 09/19/16 1221      PT LONG TERM GOAL #1   Title verbalize /demonstrate understanding of fall prevention strategies within home environment with family. (Target Date: 09/03/16; NEW TARGET DATE FOR ALL LTG 11/01/16)   Time 6   Period Days   Status  On-going     PT LONG TERM GOAL #2   Title pt/caregiver to report compliance with HEP/community fitness at least 3 days per week for continued exercise (Target Date: 09/03/16; 11/01/16)   Time 6   Period Weeks   Status On-going     PT LONG TERM GOAL #3   Title improve BERG balance score to >/= 52/56 for improved balance (Target Date: 09/03/16; 11/01/16)   Baseline 51/56 progressing   Time 6   Period Weeks   Status On-going     PT LONG TERM GOAL #4   Title improve timed up and go to < 13 sec for improved functional mobility (Target Date: 09/03/16; 11/01/16)   Baseline 14.53, progressing   Time 6   Period Weeks   Status Revised     PT LONG TERM GOAL #5   Title negotiate at least 8 stairs with single  handrail with supervision for improved household mobility (Target Date: 09/03/16)   Baseline met 09/11/16   Status Achieved     PT LONG TERM GOAL #6   Title improve 5x STS to < 17 sec for improved functional strength and mobility (Target Date: 09/03/16)   Baseline 12.66 met 09/11/16   Status Achieved     PT LONG TERM GOAL #7   Title Gait velocity to increase to at least 3 ft/sec for improved gait efficiency and safety.  TARget 11/01/16   Baseline 2.85 ft/sec on 09/19/16   Time 6   Status New               Plan - 09/24/16 1219    Clinical Impression Statement Pt demonstrated progress as she was able to perform high level balance activities over compliant surfaces with min guard to S. Pt continues to experience wide BOS during gait and requires cues to avoid obstacles during amb. Pt would continue to benefit from skilled PT to improve safety during functional mobility.    Rehab Potential Good   Clinical Impairments Affecting Rehab Potential dementia   PT Frequency 2x / week   PT Duration 6 weeks   PT Treatment/Interventions ADLs/Self Care Home Management;Cryotherapy;Moist Heat;Neuromuscular re-education;Balance training;Therapeutic exercise;Therapeutic activities;Functional mobility training;Stair training;Gait training;DME Instruction;Patient/family education;Vestibular   PT Next Visit Plan continue to focus on high level balance, gait on variety of surfaces/obstacles, endurance, LE strengthening   Consulted and Agree with Plan of Care Patient   Family Member Consulted son      Patient will benefit from skilled therapeutic intervention in order to improve the following deficits and impairments:  Abnormal gait, Decreased balance, Decreased strength, Difficulty walking, Decreased cognition  Visit Diagnosis: Other abnormalities of gait and mobility  Unsteadiness on feet  Muscle weakness (generalized)     Problem List Patient Active Problem List   Diagnosis Date Noted  .  Chronic pain of right knee 05/15/2016  . Dementia of the Alzheimer's type without behavioral disturbance 10/11/2013  . Gait instability 10/11/2013  . Memory loss 09/06/2012  . Benign paroxysmal positional vertigo 09/06/2012  . High cholesterol   . Anxiety     Miller,Jennifer L 09/24/2016, 12:21 PM  [facility] [address] [place], [place], [address] Phone: [phone]   Fax:  [phone]  Name: TINITA BROOKER MRN: [id number] Date of Birth: 11/04/1926  Geoffry Paradise, PT,DPT 09/24/16 12:22 PM Phone: [phone] Fax: [phone]

## 2016-09-26 ENCOUNTER — Ambulatory Visit: Payer: Medicare Other | Admitting: Physical Therapy

## 2016-09-26 ENCOUNTER — Encounter: Payer: Self-pay | Admitting: Physical Therapy

## 2016-09-26 DIAGNOSIS — M6281 Muscle weakness (generalized): Secondary | ICD-10-CM

## 2016-09-26 DIAGNOSIS — R2681 Unsteadiness on feet: Secondary | ICD-10-CM

## 2016-09-26 DIAGNOSIS — R2689 Other abnormalities of gait and mobility: Secondary | ICD-10-CM | POA: Diagnosis not present

## 2016-09-26 NOTE — Therapy (Signed)
[facility] [address] [place], [place], [address] Phone: [phone]   Fax:  [phone]  Physical Therapy Treatment  Patient Details  Name: Kelly Cross MRN: [id number] Date of Birth: Sep 16, 1926 Referring Provider: Suzan Slick, NP  Encounter Date: 09/26/2016      PT End of Session - 09/26/16 1209    Visit Number 14   Number of Visits 24   Date for PT Re-Evaluation 10/26/16   Authorization Type [employer] [place] code, progress note every 10th visit   PT Start Time 1105   PT Stop Time 1152   PT Time Calculation (min) 47 min   Equipment Utilized During Treatment Gait belt   Activity Tolerance Patient tolerated treatment well   Behavior During Therapy WFL for tasks assessed/performed      Past Medical History:  Diagnosis Date  . Anxiety   . High blood pressure   . High cholesterol     Past Surgical History:  Procedure Laterality Date  . AURAL ATRESIA REPAIR     1983  . BACK SURGERY     1995  . eye implants     1999  . VAGINAL HYSTERECTOMY     1995    There were no vitals filed for this visit.      Subjective Assessment - 09/26/16 1109    Subjective Patient presents to PT with her son. She denies any falls or new complaints. Patient's son reports today was the first time she walked into PT (from car to lobby and lobby to PT gym) without any hand held support from him.    Patient is accompained by: Family member   Pertinent History dementia, HLD, anxiety   Patient Stated Goals pt unable to state; son wants to help improve pt's confidence, negotiate stairs better                         [facility] Adult PT Treatment/Exercise - 09/26/16 1105      Ambulation/Gait   Ambulation/Gait Yes   Ambulation/Gait Assistance 5: Supervision;4: Min guard;4: Min assist   Ambulation/Gait Assistance Details Patient requires supervision when ambulating over level surfaces, min guard over  ramps/curbs/grass/gravel, min A over mulch.    Ambulation Distance (Feet) 230 Feet  fwd walking with balloon toss; 200 ft outdoor surfaces   Assistive device None   Gait Pattern Step-through pattern;Decreased stride length;Wide base of support   Ambulation Surface Level;Unlevel;Indoor;Outdoor;Paved;Gravel;Grass;Other (comment)  mulch   Ramp Other (comment)  min guard    Ramp Details (indicate cue type and reason) requires min guard for safety with cueing for foot clearance   Curb Other (comment)  min guard    Curb Details (indicate cue type and reason) requires cueing for stepping sequence. Performed curb stepping onto compliant surfaces   Gait Comments Performed forward/backwards/side stepping ambulation with balloon toss. Patient requires supervision for forward walk + balloon toss. Patient requires min guard for backwards/side stepping walk + balloon toss. Patient requires demonstration and cueing for sequenching. Required tactile cueing to properly perform side stepping + balloon toss.     High Level Balance   High Level Balance Activities Other (comment)  march/side step/backwards ambulation + compliant surface   High Level Balance Comments Patient performed forward/backward walking, forward marches, and side stepping over compliant (mulch) surface. Patient requires demonstration for technique and min guard to maintain safety. Requires occasional min A due to fatigue.  PT Long Term Goals - 09/19/16 1221      PT LONG TERM GOAL #1   Title verbalize /demonstrate understanding of fall prevention strategies within home environment with family. (Target Date: 09/03/16; NEW TARGET DATE FOR ALL LTG 11/01/16)   Time 6   Period Days   Status On-going     PT LONG TERM GOAL #2   Title pt/caregiver to report compliance with HEP/community fitness at least 3 days per week for continued exercise (Target Date: 09/03/16; 11/01/16)   Time 6   Period Weeks   Status  On-going     PT LONG TERM GOAL #3   Title improve BERG balance score to >/= 52/56 for improved balance (Target Date: 09/03/16; 11/01/16)   Baseline 51/56 progressing   Time 6   Period Weeks   Status On-going     PT LONG TERM GOAL #4   Title improve timed up and go to < 13 sec for improved functional mobility (Target Date: 09/03/16; 11/01/16)   Baseline 14.53, progressing   Time 6   Period Weeks   Status Revised     PT LONG TERM GOAL #5   Title negotiate at least 8 stairs with single handrail with supervision for improved household mobility (Target Date: 09/03/16)   Baseline met 09/11/16   Status Achieved     PT LONG TERM GOAL #6   Title improve 5x STS to < 17 sec for improved functional strength and mobility (Target Date: 09/03/16)   Baseline 12.66 met 09/11/16   Status Achieved     PT LONG TERM GOAL #7   Title Gait velocity to increase to at least 3 ft/sec for improved gait efficiency and safety.  TARget 11/01/16   Baseline 2.85 ft/sec on 09/19/16   Time 6   Status New               Plan - 09/26/16 1209    Clinical Impression Statement Today's skilled PT session focused on advancing gait activities over compliant surfaces (mulch/grass/gravel) and with distraction (balloon toss during forward/backward ambulation and side stepping). Patient requires supervision to min guard on level surfaces, and min guard to min A on compliant surfaces. Patient continues to demonstrate a wide base of support and intermittent poor foot clearance due to fatigue. Patient is making progress, and will benefit from continued skilled PT to improve safety during functional moblity.    Rehab Potential Good   Clinical Impairments Affecting Rehab Potential dementia   PT Frequency 2x / week   PT Duration 6 weeks   PT Treatment/Interventions ADLs/Self Care Home Management;Cryotherapy;Moist Heat;Neuromuscular re-education;Balance training;Therapeutic exercise;Therapeutic activities;Functional mobility  training;Stair training;Gait training;DME Instruction;Patient/family education;Vestibular   PT Next Visit Plan endurance, LE strengthening, advance gait training on compliant surfaces    Consulted and Agree with Plan of Care Patient   Family Member Consulted son      Patient will benefit from skilled therapeutic intervention in order to improve the following deficits and impairments:  Abnormal gait, Decreased balance, Decreased strength, Difficulty walking, Decreased cognition  Visit Diagnosis: Other abnormalities of gait and mobility  Unsteadiness on feet  Muscle weakness (generalized)     Problem List Patient Active Problem List   Diagnosis Date Noted  . Chronic pain of right knee 05/15/2016  . Dementia of the Alzheimer's type without behavioral disturbance 10/11/2013  . Gait instability 10/11/2013  . Memory loss 09/06/2012  . Benign paroxysmal positional vertigo 09/06/2012  . High cholesterol   . Anxiety  Arelia Sneddon, SPT  09/26/2016, 12:51 PM  [facility] [address] [place], [place], [address] Phone: [phone]   Fax:  [phone]  Name: Kelly Cross MRN: [id number] Date of Birth: [birth year]

## 2016-10-01 ENCOUNTER — Ambulatory Visit: Payer: Medicare Other | Admitting: Physical Therapy

## 2016-10-03 ENCOUNTER — Ambulatory Visit: Payer: Medicare Other | Admitting: Physical Therapy

## 2016-10-03 ENCOUNTER — Encounter: Payer: Self-pay | Admitting: Physical Therapy

## 2016-10-03 DIAGNOSIS — R2689 Other abnormalities of gait and mobility: Secondary | ICD-10-CM

## 2016-10-03 DIAGNOSIS — M6281 Muscle weakness (generalized): Secondary | ICD-10-CM

## 2016-10-03 DIAGNOSIS — R2681 Unsteadiness on feet: Secondary | ICD-10-CM

## 2016-10-03 NOTE — Therapy (Signed)
[facility] [address] [place], [place], [address] Phone: [phone]   Fax:  [phone]  Physical Therapy Treatment  Patient Details  Name: Kelly BirksFloydelia F Bethel MRN: [id number] Date of Birth: February 22, 1927 Referring Provider: Adonis HugueninErin R Zamora, NP  Encounter Date: 10/03/2016       PT End of Session - 10/03/16 1259    Visit Number 15   Number of Visits 24   Date for PT Re-Evaluation 10/26/16   Authorization Type [employer] code, progress note every 10th visit   PT Start Time 1100   PT Stop Time 1145   PT Time Calculation (min) 45 min   Equipment Utilized During Treatment Gait belt   Activity Tolerance Patient tolerated treatment well   Behavior During Therapy [facility] for tasks assessed/performed      Past Medical History:  Diagnosis Date  . Anxiety   . High blood pressure   . High cholesterol     Past Surgical History:  Procedure Laterality Date  . AURAL ATRESIA REPAIR     1983  . BACK SURGERY     1995  . eye implants     1999  . VAGINAL HYSTERECTOMY     1995    There were no vitals filed for this visit.      Subjective Assessment - 10/03/16 1104    Subjective Patient denies any changes or falls since last visit. Patient's son reprots this has been a "bad week", and explains he has noticed the patient struggling more with ADLs this week.    Patient is accompained by: Family member   Pertinent History dementia, HLD, anxiety   Patient Stated Goals pt unable to state; son wants to help improve pt's confidence, negotiate stairs better   Currently in Pain? No/denies                         [facility] Adult PT Treatment/Exercise - 10/03/16 1100      Ambulation/Gait   Ambulation/Gait Yes   Ambulation/Gait Assistance 5: Supervision;4: Min guard;4: Min assist   Ambulation/Gait Assistance Details When ambulating over level, indoor surfaces, patient requires supervision. When dual tasking,  patient requires min guard (see neuro re-ed details, below)   Ambulation Distance (Feet) 345 Feet  while performing UE exercises with 2 pound weights   Assistive device None   Gait Pattern Step-through pattern;Decreased stride length;Wide base of support   Ambulation Surface Level;Indoor   Ramp --   Curb --   Gait Comments Performed forward/backwards/side stepping on compliant surface + balloon toss. Patient requires min guard with occasional min A due to misstep/LOB. Ambulated 345 ft on level, indoor surface while performing UE exercises with 2 pound weight (elbow flexion, shoulder flexion and abduction). Patient requires demonstration and cueing for technique.      High Level Balance   High Level Balance Activities --   High Level Balance Comments --     Neuro Re-ed    Neuro Re-ed Details  In parallel bars: patient performed balance activity on rockerboard (both with anterior/posterior and lateral directions on rockerboard) + balloon toss. Patient requires min guard to max A due to LOB. Patient requires demonstration, verbal cueing for technique, and intermittent tactile cueing.                 PT Education - 10/03/16 1108    Education provided Yes   Education Details PT inquired about consistent caregiver options to aid wiht patient's  son's report of functional decline noted this week    Person(s) Educated Patient;Child(ren)   Methods Explanation   Comprehension Verbalized understanding             PT Long Term Goals - 10/03/16 1308      PT LONG TERM GOAL #1   Title verbalize /demonstrate understanding of fall prevention strategies within home environment with family. (NEW TARGET DATE FOR ALL LTG 11/01/16)   Time 6   Period Weeks   Status On-going     PT LONG TERM GOAL #2   Title pt/caregiver to report compliance with HEP/community fitness at least 3 days per week for continued exercise (Target Date: 11/01/16)   Time 6   Period Weeks   Status On-going     PT LONG  TERM GOAL #3   Title improve BERG balance score to >/= 52/56 for improved balance (Target Date 11/01/16)   Baseline 51/56 progressing   Time 6   Period Weeks   Status On-going     PT LONG TERM GOAL #4   Title improve timed up and go to < 13 sec for improved functional mobility (Target Date: 11/01/16)   Baseline 14.53, progressing   Time 6   Period Weeks   Status Revised     PT LONG TERM GOAL #7   Title Gait velocity to increase to at least 3 ft/sec for improved gait efficiency and safety.  TARget 11/01/16   Baseline 2.85 ft/sec on 09/19/16   Time 6   Status New               Plan - 10/03/16 1303    Clinical Impression Statement Today's skilled PT session focused on advancing the patient's dynamic balance activities including multidirectional ambulation on compliant surfaces, dual tasking, and rockerboard activities. All activities required demonstration and cueing for technique. Patient tolerated treatment well, but required intermittent seated rest breaks due to fatigue. Patient is making progress towards goals, and will benefit from continued skilled PT to address functional mobility deficits.    Rehab Potential Good   Clinical Impairments Affecting Rehab Potential dementia   PT Frequency 2x / week   PT Duration 6 weeks   PT Treatment/Interventions ADLs/Self Care Home Management;Cryotherapy;Moist Heat;Neuromuscular re-education;Balance training;Therapeutic exercise;Therapeutic activities;Functional mobility training;Stair training;Gait training;DME Instruction;Patient/family education;Vestibular   PT Next Visit Plan LE strengthening, endurance activities    Consulted and Agree with Plan of Care Patient   Family Member Consulted son      Patient will benefit from skilled therapeutic intervention in order to improve the following deficits and impairments:  Abnormal gait, Decreased balance, Decreased strength, Difficulty walking, Decreased cognition  Visit Diagnosis: Other  abnormalities of gait and mobility  Unsteadiness on feet  Muscle weakness (generalized)     Problem List Patient Active Problem List   Diagnosis Date Noted  . Chronic pain of right knee 05/15/2016  . Dementia of the Alzheimer's type without behavioral disturbance 10/11/2013  . Gait instability 10/11/2013  . Memory loss 09/06/2012  . Benign paroxysmal positional vertigo 09/06/2012  . High cholesterol   . Anxiety     [place], SPT  10/03/2016, 1:04 PM  [facility] [address] [place], [place], [address] Phone: [phone]   Fax:  [phone]  Name: ANQUANETTE BAHNER MRN: [id number] Date of Birth: 08-30-1926

## 2016-10-08 ENCOUNTER — Encounter: Payer: Self-pay | Admitting: Physical Therapy

## 2016-10-08 ENCOUNTER — Ambulatory Visit: Payer: Medicare Other | Admitting: Physical Therapy

## 2016-10-08 DIAGNOSIS — R2689 Other abnormalities of gait and mobility: Secondary | ICD-10-CM | POA: Diagnosis not present

## 2016-10-08 DIAGNOSIS — M6281 Muscle weakness (generalized): Secondary | ICD-10-CM

## 2016-10-08 DIAGNOSIS — R2681 Unsteadiness on feet: Secondary | ICD-10-CM

## 2016-10-08 NOTE — Therapy (Signed)
[facility] [address] [place], [place], [address] Phone: [phone]   Fax:  [phone]  Physical Therapy Treatment  Patient Details  Name: Kelly Cross: [id number] Date of Birth: 09-01-1926 Referring Provider: Adonis HugueninErin R Zamora, NP  Encounter Date: 10/08/2016      PT End of Session - 10/08/16 1151    Visit Number 16   Number of Visits 24   Date for PT Re-Evaluation 10/26/16   Authorization Type UHC Medicare-G code, progress note every 10th visit   PT Start Time 1109  pt arrived late   PT Stop Time 1149   PT Time Calculation (min) 40 min   Activity Tolerance Patient tolerated treatment well   Behavior [facility] for tasks assessed/performed      Past Medical History:  Diagnosis Date  . Anxiety   . High blood pressure   . High cholesterol     Past Surgical History:  Procedure Laterality Date  . AURAL ATRESIA REPAIR     1983  . BACK SURGERY     1995  . eye implants     1999  . VAGINAL HYSTERECTOMY     1995    There were no vitals filed for this visit.      Subjective Assessment - 10/08/16 1111    Subjective Son reports pt had good weekend, got to visit her oldest sister this weekend.  No soreness after last visit, but did go asleep after last session.   Patient is accompained by: Family member   Pertinent History dementia, HLD, anxiety   Limitations Walking   Patient Stated Goals pt unable to state; son wants to help improve pt's confidence, negotiate stairs better   Currently in Pain? No/denies                              Balance Exercises - 10/08/16 1129      Balance Exercises: Standing   Rockerboard Anterior/posterior;Lateral;EO  during reaching, SLS   Other Standing Exercises Standing on balance foam performed UE strengthening with red theraband: retro, lateral and forward step downs from foam against resistance of therband for postural control x  20 reps, static standing balance on foam while performing alternating bicep curls and scapular row/retractions x 10 reps.  Also performed standing on ramp (feet facing up) while performing zoom ball x 1:30 in various directions for standing balance during dynamic UE movements                PT Long Term Goals - 10/03/16 1308      PT LONG TERM GOAL #1   Title verbalize /demonstrate understanding of fall prevention strategies within home environment with family. (NEW TARGET DATE FOR ALL LTG 10/26/16)   Time 6   Period Weeks   Status On-going     PT LONG TERM GOAL #2   Title pt/caregiver to report compliance with HEP/community fitness at least 3 days per week for continued exercise (Target Date: 10/26/16)   Time 6   Period Weeks   Status On-going     PT LONG TERM GOAL #3   Title improve BERG balance score to >/= 52/56 for improved balance (Target Date 10/26/16)   Baseline 51/56 progressing   Time 6   Period Weeks   Status On-going     PT LONG TERM GOAL #4   Title improve timed up and go to < 13 sec for  improved functional mobility (Target Date: 10/26/16)   Baseline 14.53, progressing   Time 6   Period Weeks   Status Revised     PT LONG TERM GOAL #7   Title Gait velocity to increase to at least 3 ft/sec for improved gait efficiency and safety.  Target 10/26/16   Baseline 2.85 ft/sec on 09/19/16   Time 6   Status New               Plan - 10/08/16 1151    Clinical Impression Statement Continued to focus treatment session on advanced dynamic balance, weight shifting, postural control, balance strategy training in combination with functional reaching and UE strengthening + standing endurance training.  Pt tolerated well but required max-total assistance to maintain balance on unstable surfaces.  Will continue to address.   Rehab Potential Good   Clinical Impairments Affecting Rehab Potential dementia   PT Treatment/Interventions ADLs/Self Care Home Management;Cryotherapy;Moist  Heat;Neuromuscular re-education;Balance training;Therapeutic exercise;Therapeutic activities;Functional mobility training;Stair training;Gait training;DME Instruction;Patient/family education;Vestibular   PT Next Visit Plan LE strengthening, endurance activities    Consulted and Agree with Plan of Care Patient   Family Member Consulted son      Patient will benefit from skilled therapeutic intervention in order to improve the following deficits and impairments:  Abnormal gait, Decreased balance, Decreased strength, Difficulty walking, Decreased cognition  Visit Diagnosis: Unsteadiness on feet  Other abnormalities of gait and mobility  Muscle weakness (generalized)     Problem List Patient Active Problem List   Diagnosis Date Noted  . Chronic pain of right knee 05/15/2016  . Dementia of the Alzheimer's type without behavioral disturbance 10/11/2013  . Gait instability 10/11/2013  . Memory loss 09/06/2012  . Benign paroxysmal positional vertigo 09/06/2012  . High cholesterol   . Anxiety     Edman Circle, PT, DPT 10/08/16    12:57 PM    [facility] [address] [place], [place], [address] Phone: [phone]   Fax:  [phone]  Name: Kelly Cross Cross: [id number] Date of Birth: [birth year]

## 2016-10-10 ENCOUNTER — Ambulatory Visit: Payer: Medicare Other | Admitting: Physical Therapy

## 2016-10-10 ENCOUNTER — Encounter: Payer: Self-pay | Admitting: Physical Therapy

## 2016-10-10 DIAGNOSIS — R2689 Other abnormalities of gait and mobility: Secondary | ICD-10-CM | POA: Diagnosis not present

## 2016-10-10 DIAGNOSIS — M6281 Muscle weakness (generalized): Secondary | ICD-10-CM

## 2016-10-10 DIAGNOSIS — R2681 Unsteadiness on feet: Secondary | ICD-10-CM

## 2016-10-10 NOTE — Therapy (Signed)
[facility] [address] [place], [place], [address] Phone: [phone]   Fax:  [phone]  Physical Therapy Treatment  Patient Details  Name: Kelly BirksFloydelia F Cross MRN: [id number] Date of Birth: [birth year] Referring Provider: Adonis HugueninErin R Zamora, NP  Encounter Date: 10/10/2016      PT End of Session - 10/10/16 1252    Visit Number 17   Number of Visits 24   Date for PT Re-Evaluation 10/26/16   Authorization Type UHC Medicare-G code, progress note every 10th visit   PT Start Time 1105   PT Stop Time 1145   PT Time Calculation (min) 40 min   Equipment Utilized During Treatment Gait belt   Activity Tolerance Patient tolerated treatment well   Behavior During Therapy [facility] for tasks assessed/performed      Past Medical History:  Diagnosis Date  . Anxiety   . High blood pressure   . High cholesterol     Past Surgical History:  Procedure Laterality Date  . AURAL ATRESIA REPAIR     1983  . BACK SURGERY     1995  . eye implants     1999  . VAGINAL HYSTERECTOMY     1995    There were no vitals filed for this visit.      Subjective Assessment - 10/10/16 1108    Subjective Patient denies any new complaints, changes or falls since last visit. Patient's son reports she has seemed more "disconnected" this week. Patient's son reports walking approximately 0.25  miles on paved, hilly surface. Patient's son reports he is always with patient when she is walking around the block.   Patient is accompained by: Family member   Pertinent History dementia, HLD, anxiety   Limitations Walking   Patient Stated Goals pt unable to state; son wants to help improve pt's confidence, negotiate stairs better   Currently in Pain? No/denies                         [facility] PT Treatment/Exercise - 10/10/16 1100      Ambulation/Gait   Ambulation/Gait Yes   Ambulation/Gait Assistance 5: Supervision;4: Min guard;4: Min  assist   Ambulation Distance (Feet) 75 Feet   Assistive device None   Gait Pattern Step-through pattern;Decreased stride length;Wide base of support   Ambulation Surface Level;Indoor   Gait Comments --     Neuro Re-ed    Neuro Re-ed Details  On incline (toes facing up and down ramp): zoom ball + forward step; zoom ball with stationary narrow base of support. Patient required demonstration, verbal cueing for technique, and min guard with intermittent min A to maintain balance and safety. Patient performed modified 4-square step test with color discs for providing visual cues. PT instructed patient in forward/backwards/side/diagonal stepping by calling out colors for patient to find. Patient required min guard but required min A when retro stepping.                     PT Long Term Goals - 10/03/16 1308      PT LONG TERM GOAL #1   Title verbalize /demonstrate understanding of fall prevention strategies within home environment with family. (NEW TARGET DATE FOR ALL LTG 10/26/16)   Time 6   Period Weeks   Status On-going     PT LONG TERM GOAL #2   Title pt/caregiver to report compliance with HEP/community fitness at least 3 days per week for  continued exercise (Target Date: 10/26/16)   Time 6   Period Weeks   Status On-going     PT LONG TERM GOAL #3   Title improve BERG balance score to >/= 52/56 for improved balance (Target Date 10/26/16)   Baseline 51/56 progressing   Time 6   Period Weeks   Status On-going     PT LONG TERM GOAL #4   Title improve timed up and go to < 13 sec for improved functional mobility (Target Date: 10/26/16)   Baseline 14.53, progressing   Time 6   Period Weeks   Status Revised     PT LONG TERM GOAL #7   Title Gait velocity to increase to at least 3 ft/sec for improved gait efficiency and safety.  Target 10/26/16   Baseline 2.85 ft/sec on 09/19/16   Time 6   Status New               Plan - 10/10/16 1256    Clinical Impression Statement  Today's skilled PT session focused on advancing patient's dynamic balance activities by challenging patient's postural control and stepping strategy via activities that required patient to pay attention to multiple steps and/or follow instructions in order to incorporate cognitive dual tasking with dynamic balance activities. Patient required multiple seated rest breaks due to fatigue. Patient is making progress towards goals, and will benefit from continued skilled PT to address functional mobility deficits.    Rehab Potential Good   Clinical Impairments Affecting Rehab Potential dementia   PT Treatment/Interventions ADLs/Self Care Home Management;Cryotherapy;Moist Heat;Neuromuscular re-education;Balance training;Therapeutic exercise;Therapeutic activities;Functional mobility training;Stair training;Gait training;DME Instruction;Patient/family education;Vestibular   PT Next Visit Plan advance dynamic balance activities; LE strengthening; incorporate dual tasking if possible    Consulted and Agree with Plan of Care Patient   Family Member Consulted son      Patient will benefit from skilled therapeutic intervention in order to improve the following deficits and impairments:  Abnormal gait, Decreased balance, Decreased strength, Difficulty walking, Decreased cognition  Visit Diagnosis: Unsteadiness on feet  Other abnormalities of gait and mobility  Muscle weakness (generalized)     Problem List Patient Active Problem List   Diagnosis Date Noted  . Chronic pain of right knee 05/15/2016  . Dementia of the Alzheimer's type without behavioral disturbance 10/11/2013  . Gait instability 10/11/2013  . Memory loss 09/06/2012  . Benign paroxysmal positional vertigo 09/06/2012  . High cholesterol   . Anxiety     [place], SPT  10/10/2016, 12:57 PM  [facility] [address] [place], [place], [address] Phone: [phone]    Fax:  [phone]  Name: Kelly Cross MRN: [id number] Date of Birth: 24-Mar-1927

## 2016-10-15 ENCOUNTER — Ambulatory Visit: Payer: Medicare Other | Admitting: Physical Therapy

## 2016-10-17 ENCOUNTER — Ambulatory Visit: Payer: Medicare Other | Admitting: Physical Therapy

## 2016-10-22 ENCOUNTER — Encounter: Payer: Self-pay | Admitting: Physical Therapy

## 2016-10-22 ENCOUNTER — Ambulatory Visit: Payer: Medicare Other | Admitting: Physical Therapy

## 2016-10-22 DIAGNOSIS — R2689 Other abnormalities of gait and mobility: Secondary | ICD-10-CM

## 2016-10-22 DIAGNOSIS — R2681 Unsteadiness on feet: Secondary | ICD-10-CM

## 2016-10-22 DIAGNOSIS — M6281 Muscle weakness (generalized): Secondary | ICD-10-CM

## 2016-10-22 NOTE — Therapy (Signed)
[facility] [address] [place], [place], [address] Phone: [phone]   Fax:  [phone]  Physical Therapy Treatment  Patient Details  Name: Kelly Cross MRN: [id number] Date of Birth: 1926-06-16 Referring Provider: Suzan Slick, NP  Encounter Date: 10/22/2016      PT End of Session - 10/22/16 1217    Visit Number 18   Number of Visits 24   Date for PT Re-Evaluation 10/26/16   Authorization Type UHC [place] code, progress note every 10th visit   PT Start Time 1100   PT Stop Time 1145   PT Time Calculation (min) 45 min   Activity Tolerance Patient tolerated treatment well   Behavior During Therapy [facility] for tasks assessed/performed      Past Medical History:  Diagnosis Date  . Anxiety   . High blood pressure   . High cholesterol     Past Surgical History:  Procedure Laterality Date  . AURAL ATRESIA REPAIR     1983  . BACK SURGERY     1995  . eye implants     1999  . VAGINAL HYSTERECTOMY     1995    There were no vitals filed for this visit.      Subjective Assessment - 10/22/16 1105    Subjective Patient's son reports last week was a tough week and patient was forgetting daily routine activities. Patient denies any falls.    Patient is accompained by: Family member   Pertinent History dementia, HLD, anxiety   Limitations Walking   Patient Stated Goals pt unable to state; son wants to help improve pt's confidence, negotiate stairs better   Currently in Pain? No/denies            [facility] PT Assessment - 10/22/16 1100      Berg Balance Test   Sit to Stand Able to stand without using hands and stabilize independently   Standing Unsupported Able to stand safely 2 minutes   Sitting with Back Unsupported but Feet Supported on Floor or Stool Able to sit safely and securely 2 minutes   Stand to Sit Sits safely with minimal use of hands   Transfers Able to transfer safely, minor use of  hands   Standing Unsupported with Eyes Closed Able to stand 10 seconds safely   Standing Ubsupported with Feet Together Able to place feet together independently and stand 1 minute safely   From Standing, Reach Forward with Outstretched Arm Can reach confidently >25 cm (10")   From Standing Position, Pick up Object from Floor Able to pick up shoe safely and easily   From Standing Position, Turn to Look Behind Over each Shoulder Looks behind from both sides and weight shifts well   Turn 360 Degrees Able to turn 360 degrees safely in 4 seconds or less   Standing Unsupported, Alternately Place Feet on Step/Stool Able to stand independently and safely and complete 8 steps in 20 seconds   Standing Unsupported, One Foot in Front Able to plae foot ahead of the other independently and hold 30 seconds   Standing on One Leg Able to lift leg independently and hold equal to or more than 3 seconds   Total Score 53   Berg comment: Baseline Berg Balance 47/56     Timed Up and Go Test   TUG Normal TUG   Normal TUG (seconds) 13.39  Baseline standard TUG score = 17.9s  [facility] Adult PT Treatment/Exercise - 10/22/16 1100      Ambulation/Gait   Ambulation/Gait Yes   Ambulation/Gait Assistance 5: Supervision;4: Min guard   Ambulation/Gait Assistance Details requires min guard during attention demanding tasks or crowded spaces    Ambulation Distance (Feet) 120 Feet   Assistive device None   Gait Pattern Step-through pattern;Decreased stride length;Wide base of support   Ambulation Surface Level;Indoor     Neuro Re-ed    Neuro Re-ed Details  On physioball: PT positioned and instructed patient in balance activity with maintaining upright seated posture + foot taps on colored discs. Progressed activity to foot taps on foam bubbles. Patient instructed to follow PT's verbal cues to  know which color to tap ("L foot purple"). Patient requires tactile and verbal cueing to maintain  proper posture.                 PT Education - 10/22/16 1106    Education provided Yes   Education Details PT inquired again about consistent caregiver options to aid with functional decline recently noted    Person(s) Educated Patient;Child(ren)   Methods Explanation   Comprehension Verbalized understanding             PT Long Term Goals - 10/22/16 1117      PT LONG TERM GOAL #1   Title verbalize /demonstrate understanding of fall prevention strategies within home environment with family. (NEW TARGET DATE FOR ALL LTG 10/26/16)   Time 6   Period Weeks   Status On-going     PT LONG TERM GOAL #2   Title pt/caregiver to report compliance with HEP/community fitness at least 3 days per week for continued exercise (Target Date: 10/26/16)   Time 6   Period Weeks   Status On-going     PT LONG TERM GOAL #3   Title improve BERG balance score to >/= 52/56 for improved balance (Target Date 10/26/16)   Baseline MET 10/22/2016 Berg Balance score = 53/56   Time 6   Period Weeks   Status Achieved     PT LONG TERM GOAL #4   Title improve timed up and go to < 13 sec for improved functional mobility (Target Date: 10/26/16)   Baseline 13.39s, progressing (10/22/2016)   Time 6   Period Weeks   Status Revised     PT LONG TERM GOAL #7   Title Gait velocity to increase to at least 3 ft/sec for improved gait efficiency and safety.  Target 10/26/16   Baseline 2.85 ft/sec on 09/19/16   Time 6   Status New               Plan - 10/22/16 1217    Clinical Impression Statement Today's skilled PT session focused on beginning to assess patient's long term goals and advancing patient's balance activities. Patient has met the [facility] goal as patient score 53/56. Patient is still progressing towards TUG goal. PT plans to assess remaining long term goals at next session. PT progressed balance activities to encourage postural control. Patient is making progress towards goals and will benefit  from continued skilled PT to address functional mobility deficits.    Rehab Potential Good   Clinical Impairments Affecting Rehab Potential dementia   PT Treatment/Interventions ADLs/Self Care Home Management;Cryotherapy;Moist Heat;Neuromuscular re-education;Balance training;Therapeutic exercise;Therapeutic activities;Functional mobility training;Stair training;Gait training;DME Instruction;Patient/family education;Vestibular   PT Next Visit Plan assess remaining LTGs-gait velocity, discuss community wellness options, FOTO?, D/C   Consulted and Agree with Plan of Care Patient  Family Member Consulted son      Patient will benefit from skilled therapeutic intervention in order to improve the following deficits and impairments:  Abnormal gait, Decreased balance, Decreased strength, Difficulty walking, Decreased cognition  Visit Diagnosis: Muscle weakness (generalized)  Other abnormalities of gait and mobility  Unsteadiness on feet     Problem List Patient Active Problem List   Diagnosis Date Noted  . Chronic pain of right knee 05/15/2016  . Dementia of the Alzheimer's type without behavioral disturbance 10/11/2013  . Gait instability 10/11/2013  . Memory loss 09/06/2012  . Benign paroxysmal positional vertigo 09/06/2012  . High cholesterol   . Anxiety     Arelia Sneddon, SPT  10/22/2016, 12:18 PM  [place] [address] [place], [place], [address] Phone: [phone]   Fax:  [phone]  Name: Kelly Cross MRN: [id number] Date of Birth: [birth year]

## 2016-10-24 ENCOUNTER — Ambulatory Visit: Payer: Medicare Other | Attending: Family | Admitting: Physical Therapy

## 2016-10-24 ENCOUNTER — Encounter: Payer: Self-pay | Admitting: Physical Therapy

## 2016-10-24 DIAGNOSIS — R2681 Unsteadiness on feet: Secondary | ICD-10-CM

## 2016-10-24 DIAGNOSIS — M6281 Muscle weakness (generalized): Secondary | ICD-10-CM | POA: Diagnosis not present

## 2016-10-24 DIAGNOSIS — R2689 Other abnormalities of gait and mobility: Secondary | ICD-10-CM

## 2016-10-24 NOTE — Therapy (Signed)
[facility] [address] [place], [place], [address] Phone: [phone]   Fax:  [phone]  Physical Therapy Treatment and D/C Summary  Patient Details  Name: Kelly Cross MRN: [id number] Date of Birth: 12/26/1926 Referring Provider: Erin R Zamora, NP  Encounter Date: 10/24/2016      PT End of Session - 10/24/16 1621    Visit Number 19   Number of Visits 24   Date for PT Re-Evaluation 10/26/16  D/C today   Authorization Type [employer] Medicare-G code, progress note every 10th visit   PT Start Time 1100   PT Stop Time 1146   PT Time Calculation (min) 46 min   Activity Tolerance Patient tolerated treatment well   Behavior During Therapy WFL for tasks assessed/performed      Past Medical History:  Diagnosis Date  . Anxiety   . High blood pressure   . High cholesterol     Past Surgical History:  Procedure Laterality Date  . AURAL ATRESIA REPAIR     1983  . BACK SURGERY     1995  . eye implants     1999  . VAGINAL HYSTERECTOMY     1995    There were no vitals filed for this visit.      Subjective Assessment - 10/24/16 1103    Subjective No issues to report, pt reporting have a good day.   Patient is accompained by: Family member   Pertinent History dementia, HLD, anxiety   Limitations Walking   Patient Stated Goals pt unable to state; son wants to help improve pt's confidence, negotiate stairs better   Currently in Pain? No/denies            [facility] PT Assessment - 10/24/16 1104      Observation/Other Assessments   Focus on Therapeutic Outcomes (FOTO)  60 (40% limited)   Activities of Balance Confidence Scale (ABC Scale)  40.6%     Ambulation/Gait   Stairs Yes   Stairs Assistance 6: Modified independent (Device/Increase time)   Stair Management Technique Two rails;Alternating pattern;Forwards   Number of Stairs 12   Height of Stairs 6     Standardized Balance Assessment   Standardized Balance Assessment 10 meter walk test;Timed Up and Go Test   10 Meter Walk 9.75 seconds or 3.36 ft/sec     Timed Up and Go Test   TUG Normal TUG   Normal TUG (seconds) 10.85                          Balance Exercises - 10/24/16 1146      Balance Exercises: Standing   Standing Eyes Opened Narrow base of support (BOS);Head turns;Solid surface  10 reps head turns, partial tandem stance   Standing Eyes Closed Narrow base of support (BOS);Head turns;Foam/compliant surface  10 reps head turns, feet together   SLS Eyes open;Solid surface;Intermittent upper extremity support;10 secs;2 reps    Feet Together (Compliant Surface) Head Motion - Eyes Closed    Stand on compliant surface: pillow or foam with feet together. Close eyes and move head slowly, up and down 10 times, side to side 10 times. Repeat 1 times per session. Do 2 sessions per day.  Feet Partial Heel-Toe, Head Motion - Eyes Open    With eyes open, right foot partially in front of the other, move head slowly: up and down 10 times, side to side 10 times.  Switch feet and   repeat. Repeat 1 times per session. Do 2 sessions per day.       PT Education - 13-Nov-2016 1621    Education provided Yes   Education Details community wellness and exercise groups to maintain gains and maintain confidence with mobility   Person(s) Educated Patient;Child(ren)   Methods Explanation;Handout   Comprehension Verbalized understanding             PT Long Term Goals - 13-Nov-2016 1622      PT LONG TERM GOAL #1   Title verbalize /demonstrate understanding of fall prevention strategies within home environment with family. (NEW TARGET DATE FOR ALL LTG 10/26/16)   Time 6   Period Weeks   Status Achieved     PT LONG TERM GOAL #2   Title pt/caregiver to report compliance with HEP/community fitness at least 3 days per week for continued exercise (Target Date: 10/26/16)   Time 6   Period Weeks   Status Achieved      PT LONG TERM GOAL #3   Title improve BERG balance score to >/= 52/56 for improved balance (Target Date 10/26/16)   Baseline MET 10/22/2016 Berg Balance score = 53/56   Time 6   Period Weeks   Status Achieved     PT LONG TERM GOAL #4   Title improve timed up and go to < 13 sec for improved functional mobility (Target Date: 10/26/16)   Baseline 10.85 on 11/13/16   Time 6   Period Weeks   Status Achieved     PT LONG TERM GOAL #7   Title Gait velocity to increase to at least 3 ft/sec for improved gait efficiency and safety.  Target 10/26/16   Baseline 3.68f/sec on 62018-06-21  Time 6   Status Achieved               Plan - 0Jun 21, 20181624    Clinical Impression Statement Treatment session focused on finishing re-assessment of LTG and discussing with pt and son community wellness and exercises resources to maintain functional gains and confidence with mobility.  Pt has met all LTG and has demonstrated significant improvements in LE strength, dynamic balance, safety with gait and decreased falls risk.  Pt to D/C from therapy today but would benefit from community exercise classes for continued strengthening, endurance training, balance training and interaction with other community individuals.     Rehab Potential Good   Clinical Impairments Affecting Rehab Potential dementia   PT Treatment/Interventions ADLs/Self Care Home Management;Cryotherapy;Moist Heat;Neuromuscular re-education;Balance training;Therapeutic exercise;Therapeutic activities;Functional mobility training;Stair training;Gait training;DME Instruction;Patient/family education;Vestibular   PT Next Visit Plan D/C today   Consulted and Agree with Plan of Care Patient;Family member/caregiver   Family Member Consulted son      Patient will benefit from skilled therapeutic intervention in order to improve the following deficits and impairments:  Abnormal gait, Decreased balance, Decreased strength, Difficulty walking, Decreased  cognition  Visit Diagnosis: Muscle weakness (generalized)  Other abnormalities of gait and mobility  Unsteadiness on feet       G-Codes - 0Jun 21, 20181627    Functional Assessment Tool Used (Outpatient Only) BERG, TUG, gait velocity   Functional Limitation Mobility: Walking and moving around   Mobility: Walking and Moving Around Goal Status ([phone] At least 1 percent but less than 20 percent impaired, limited or restricted   Mobility: Walking and Moving Around Discharge Status ([phone] At least 1 percent but less than 20 percent impaired, limited or restricted     PHYSICAL THERAPY DISCHARGE SUMMARY  Visits from Start of Care: 19  Current functional level related to goals / functional outcomes: See goals above-all goals met   Remaining deficits: Impaired strength and balance   Education / Equipment: HEP, handout on community wellness and falls prevention resources  Plan: Patient agrees to discharge.  Patient goals were met. Patient is being discharged due to meeting the stated rehab goals.  ?????       Problem List Patient Active Problem List   Diagnosis Date Noted  . Chronic pain of right knee 05/15/2016  . Dementia of the Alzheimer's type without behavioral disturbance 10/11/2013  . Gait instability 10/11/2013  . Memory loss 09/06/2012  . Benign paroxysmal positional vertigo 09/06/2012  . High cholesterol   . Anxiety      Hall, PT, DPT 10/24/16    4:31 PM    [facility] [address] [place], [place], [address] Phone: [phone]   Fax:  [phone]  Name: Kelly Cross MRN: [id number] Date of Birth: 10/12/1926   

## 2016-10-24 NOTE — Patient Instructions (Signed)
Feet Together (Compliant Surface) Head Motion - Eyes Closed    Stand on compliant surface: pillow or foam with feet together. Close eyes and move head slowly, up and down 10 times, side to side 10 times. Repeat 1 times per session. Do 2 sessions per day.  Feet Partial Heel-Toe, Head Motion - Eyes Open    With eyes open, right foot partially in front of the other, move head slowly: up and down 10 times, side to side 10 times.  Switch feet and repeat. Repeat 1 times per session. Do 2 sessions per day.

## 2016-12-16 ENCOUNTER — Ambulatory Visit: Payer: Self-pay | Admitting: Nurse Practitioner

## 2016-12-16 NOTE — Progress Notes (Deleted)
[facility]  PATIENT: Kelly Cross DOB: Aug 02, 1926   REASON FOR VISIT: *** HISTORY FROM:    HISTORY OF PRESENT ILLNESS:Kelly Cross is a 83 years old right-handed African American female, accompanied by her son, referred by her primary care physician for evaluation of memory loss.   She has past medical history of pulmonary sarcoidosis, was treated with steroid more than 40 years ago, also history of hypertension, anxiety, bilateral cataract surgery, she had college degree, taught sixth-grade transiently, was a homemaker,, but very actively involved in church, community service, her son stated that she really the captain of her family, has been taking care of her sick husband, who suffered Alzheimer's disease, her son who has schizophrenia, was dedicated.   She was noted to get frustrated easily over the past one year, which is not her normal self, also began to make mistakes, she forgets to take her medications sometimes, but she is still very active at home, she is supervising her son, who suffers schizophrenia, she is his main caregiver, supervising his medications, she is driving only short distance, but even that she got lost sometimes, she still pay home bills, but her son began to step in to help her  She denies significant visual loss, no lateralized motor or sensory deficit, her son provide most of the story  MRI of the brain showed mild atrophy, laboratory evaluation was normal, including B12. Son reported, she was highly functional, now struggling with her daily activity, no longer driving, she also has hard of hearing  UPDATE Feb 13 2014: She continue decline with her memory, increased confusion, also mild unsteady gait, complains of frequent low back pain, taking Tylenol almost every night to sleep, she used to take gabapentin 300 mg twice a day for restless leg syndrome, was not sure it has been helpful, she is taking Namenda, and Aricept. She  still takes care of her younger son, who suffered Schizophrenic. UPDATE December 04 2014: She is with her son Burton Apley at today's visit She is taking Namazaric, tolerating it well, her son son is concerned about her increase the memory trouble, she has more trouble handling daily activity, such as cooking, keep up with her medications, she put Vaseline inside her mouth for her dry mouth, she is very hesitant to accept home help, which is well covered under her long-term care  UPDATE 06/06/2015; Kelly Cross, 83 year old female returns for follow-up she was last seen in the office 12/04/2014 by Dr. Terrace Arabia. She is accompanied by her son. She is currently taking Namazaric without side effects. She no longer drives. She can feed and dress herself, she needs assistance with bathing. There have been no falls since last seen. Her bedroom is upstairs. She is no longer cooking. She returns for reevaluation. Son has multiple questions about providing the best care for her. Records were reviewed UPDATE 07/12/2017CM Kelly Cross, 83 year old female returns for follow-up for memory loss, Alzheimer's dementia. She is currently taking Namzaric without side effects. She needs assistance with bathing but she can feed and dress herself. She no longer drives, she no longer cooks. She reads books. She returns for reevaluation  UPDATE Jan 16th 2018: She still lives with her two sons, she has quit driving in 1610, she get up every morning to fix breakfast, fix dinner, she rarely goes to stove anymore, she tried to clean the house, she walks around, trying to find misplaced things.  She still has other 7 siblings, from age 88-96, her son Burton Apley  has insulin dependent DM, her MMSE is 23/30,    REVIEW OF SYSTEMS: Full 14 system review of systems performed and notable only for those listed, all others are neg:  Constitutional: neg  Cardiovascular: neg Ear/Nose/Throat: neg  Skin: neg Eyes: neg Respiratory: neg Gastroitestinal: neg    Hematology/Lymphatic: neg  Endocrine: neg Musculoskeletal:neg Allergy/Immunology: neg Neurological: neg Psychiatric: neg Sleep : neg   ALLERGIES: No Known Allergies  HOME MEDICATIONS: Outpatient Medications Prior to Visit  Medication Sig Dispense Refill  . Acetaminophen (TYLENOL EXTRA STRENGTH PO) Take 500 mg by mouth.     . BELSOMRA 10 MG TABS at bedtime.     Marland Kitchen. CALCIUM ASPARTATE PO Take by mouth daily.    . diclofenac sodium (VOLTAREN) 1 % GEL     . hydrochlorothiazide (HYDRODIURIL) 25 MG tablet Take 25 mg by mouth daily.    . Memantine HCl-Donepezil HCl (NAMZARIC) 28-10 MG CP24 Take 1 capsule by mouth daily. 90 capsule 4  . Multiple Vitamins-Minerals (CENTRUM SILVER PO) Take by mouth daily.    Marland Kitchen. omeprazole (PRILOSEC) 20 MG capsule Take 20 mg by mouth daily.    . sertraline (ZOLOFT) 100 MG tablet TK 1 T PO ONCE A DAY  12   No facility-administered medications prior to visit.     PAST MEDICAL HISTORY: Past Medical History:  Diagnosis Date  . Anxiety   . High blood pressure   . High cholesterol     PAST SURGICAL HISTORY: Past Surgical History:  Procedure Laterality Date  . AURAL ATRESIA REPAIR     1983  . BACK SURGERY     1995  . eye implants     1999  . VAGINAL HYSTERECTOMY     1995    FAMILY HISTORY: Family History  Problem Relation Age of Onset  . Diabetes Brother   . Leukemia Sister     SOCIAL HISTORY: Social History   Social History  . Marital status: Widowed    Spouse name: N/A  . Number of children: 3  . Years of education: N/A   Occupational History  . Not on file.   Social History Main Topics  . Smoking status: Never Smoker  . Smokeless tobacco: Never Used  . Alcohol use No  . Drug use: No  . Sexual activity: Not on file   Other Topics Concern  . Not on file   Social History Narrative   Living with 2 sons.       PHYSICAL EXAM  There were no vitals filed for this visit. There is no height or weight on file to calculate  BMI.  Generalized: Well developed, in no acute distress  Head: normocephalic and atraumatic,. Oropharynx benign  Neck: Supple, no carotid bruits  Cardiac: Regular rate rhythm, no murmur  Musculoskeletal: No deformity   Neurological examination   Mentation: Alert oriented to time, place, history taking. Attention span and concentration appropriate. Recent and remote memory intact.  Follows all commands speech and language fluent.   Cranial nerve II-XII: Fundoscopic exam reveals sharp disc margins.Pupils were equal round reactive to light extraocular movements were full, visual field were full on confrontational test. Facial sensation and strength were normal. hearing was intact to finger rubbing bilaterally. Uvula tongue midline. head turning and shoulder shrug were normal and symmetric.Tongue protrusion into cheek strength was normal. Motor: normal bulk and tone, full strength in the BUE, BLE, fine finger movements normal, no pronator drift. No focal weakness Sensory: normal and symmetric to light touch, pinprick, and  Vibration, proprioception  Coordination: finger-nose-finger, heel-to-shin bilaterally, no dysmetria Reflexes: Brachioradialis 2/2, biceps 2/2, triceps 2/2, patellar 2/2, Achilles 2/2, plantar responses were flexor bilaterally. Gait and Station: Rising up from seated position without assistance, normal stance,  moderate stride, good arm swing, smooth turning, able to perform tiptoe, and heel walking without difficulty. Tandem gait is steady  DIAGNOSTIC DATA (LABS, IMAGING, TESTING) - I reviewed patient records, labs, notes, testing and imaging myself where available.  Lab Results  Component Value Date   WBC 5.9 09/06/2012   HGB 12.3 09/06/2012   HCT 38.2 09/06/2012   MCV 99 (H) 09/06/2012   PLT 214 09/06/2012      Component Value Date/Time   NA 142 09/06/2012 1122   K 4.1 09/06/2012 1122   CL 103 09/06/2012 1122   CO2 28 09/06/2012 1122   GLUCOSE 84 09/06/2012 1122    BUN 14 09/06/2012 1122   CREATININE 1.01 (H) 09/06/2012 1122   CALCIUM 9.6 09/06/2012 1122   PROT 6.9 09/06/2012 1122   ALBUMIN 3.9 09/06/2012 1122   AST 33 09/06/2012 1122   ALT 23 09/06/2012 1122   ALKPHOS 90 09/06/2012 1122   BILITOT 0.3 09/06/2012 1122   GFRNONAA 52 (L) 09/06/2012 1122   GFRAA 59 (L) 09/06/2012 1122   No results found for: CHOL, HDL, LDLCALC, LDLDIRECT, TRIG, CHOLHDL No results found for: ZOXW9U Lab Results  Component Value Date   VITAMINB12 1,074 (H) 09/06/2012   Lab Results  Component Value Date   TSH 0.542 09/06/2012    ***  ASSESSMENT AND PLAN 83 y.o. year old female has a past medical history of dementia probable Alzheimer's dementia.  Continue Namzaric at current dose will refill Exercise memory by utilizing strategy games such as cards cross words, Scrabble etc. Be careful with ambulation at risk for falls F/U in 6 months with nurse practitioner Her son talked about potential assistant living placement, patient currently is against idea      Nilda Riggs, [facility], [facility], APRN  [facility] [address], [address] [place], [place] [address] [phone]

## 2016-12-17 ENCOUNTER — Encounter: Payer: Self-pay | Admitting: Nurse Practitioner

## 2016-12-18 ENCOUNTER — Encounter: Payer: Self-pay | Admitting: Nurse Practitioner

## 2016-12-18 ENCOUNTER — Ambulatory Visit (INDEPENDENT_AMBULATORY_CARE_PROVIDER_SITE_OTHER): Payer: Medicare Other | Admitting: Nurse Practitioner

## 2016-12-18 VITALS — BP 114/62 | HR 63 | Ht 63.0 in | Wt 116.2 lb

## 2016-12-18 DIAGNOSIS — F0280 Dementia in other diseases classified elsewhere without behavioral disturbance: Secondary | ICD-10-CM | POA: Diagnosis not present

## 2016-12-18 DIAGNOSIS — G309 Alzheimer's disease, unspecified: Secondary | ICD-10-CM | POA: Diagnosis not present

## 2016-12-18 DIAGNOSIS — R413 Other amnesia: Secondary | ICD-10-CM

## 2016-12-18 NOTE — Patient Instructions (Signed)
Continue Namzaric at current dose does not need refills Exercise memory by utilizing strategy games such as cards cross words, Scrabble etc. Be careful with ambulation at risk for falls especially going up and down stairs F/U in 6 months

## 2016-12-18 NOTE — Progress Notes (Signed)
[facility]  PATIENT: Johny DrillingFloydelia F Weniger DOB: Sep 09, 1926   REASON FOR VISIT: follow up for dementia HISTORY FROM:patient and son    HISTORY OF PRESENT ILLNESS:Mrs. Silveria is a 83 years old right-handed African American female, accompanied by her son, referred by her primary care physician for evaluation of memory loss.   She has past medical history of pulmonary sarcoidosis, was treated with steroid more than 40 years ago, also history of hypertension, anxiety, bilateral cataract surgery, she had college degree, taught sixth-grade transiently, was a homemaker,, but very actively involved in church, community service, her son stated that she really the captain of her family, has been taking care of her sick husband, who suffered Alzheimer's disease, her son who has schizophrenia, was dedicated.   She was noted to get frustrated easily over the past one year, which is not her normal self, also began to make mistakes, she forgets to take her medications sometimes, but she is still very active at home, she is supervising her son, who suffers schizophrenia, she is his main caregiver, supervising his medications, she is driving only short distance, but even that she got lost sometimes, she still pay home bills, but her son began to step in to help her  She denies significant visual loss, no lateralized motor or sensory deficit, her son provide most of the story  MRI of the brain showed mild atrophy, laboratory evaluation was normal, including B12. Son reported, she was highly functional, now struggling with her daily activity, no longer driving, she also has hard of hearing  UPDATE Feb 13 2014: She continue decline with her memory, increased confusion, also mild unsteady gait, complains of frequent low back pain, taking Tylenol almost every night to sleep, she used to take gabapentin 300 mg twice a day for restless leg syndrome, was not sure it has been helpful, she is  taking Namenda, and Aricept. She still takes care of her younger son, who suffered Schizophrenic. UPDATE December 04 2014: She is with her son [name] at today's visit She is taking Namazaric, tolerating it well, her son son is concerned about her increase the memory trouble, she has more trouble handling daily activity, such as cooking, keep up with her medications, she put Vaseline inside her mouth for her dry mouth, she is very hesitant to accept home help, which is well covered under her long-term care  UPDATE 06/06/2015; Mrs. Shana ChuteSpruill, 83 year old female returns for follow-up she was last seen in the office 12/04/2014 by Dr. Terrace ArabiaYan. She is accompanied by her son. She is currently taking Namazaric without side effects. She no longer drives. She can feed and dress herself, she needs assistance with bathing. There have been no falls since last seen. Her bedroom is upstairs. She is no longer cooking. She returns for reevaluation. Son has multiple questions about providing the best care for her. Records were reviewed UPDATE 07/12/2017CM Ms. Lybarger, 83 year old female returns for follow-up for memory loss, Alzheimer's dementia. She is currently taking Namzaric without side effects. She needs assistance with bathing but she can feed and dress herself. She no longer drives, she no longer cooks. She reads books. She returns for reevaluation  UPDATE Jan 16th 2018:YY She still lives with her two sons, she has quit driving in [id number], she get up every morning to fix breakfast, fix dinner, she rarely goes to stove anymore, she tried to clean the house, she walks around, trying to find misplaced things.  She still has other 7 siblings, from  age 81-96, her son [name] has insulin dependent DM, her MMSE is 23/30,  UPDATE 07/26/2018CM. Ms Quizon, 83 year old female returns for follow-up with her son for dementia. She remains on Namzaric daily. She continues to live in her own home. Her son lives with her.appetite is good, she  sleeps well, no agitation or behavior outbursts. She can feed and dress herself. She no longer cooks or drives. She returns for reevaluation  REVIEW OF SYSTEMS: Full 14 system review of systems performed and notable only for those listed, all others are neg:  Constitutional: neg  Cardiovascular: neg Ear/Nose/Throat: hearing loss  Skin: neg Eyes: neg Respiratory: neg Gastroitestinal: neg  Hematology/Lymphatic: neg  Endocrine: neg Musculoskeletal:neg Allergy/Immunology: neg Neurological: memory loss Psychiatric: neg Sleep : neg   ALLERGIES: No Known Allergies  HOME MEDICATIONS: Outpatient Medications Prior to Visit  Medication Sig Dispense Refill  . Acetaminophen (TYLENOL EXTRA STRENGTH PO) Take 500 mg by mouth.     . BELSOMRA 10 MG TABS at bedtime.     Marland Kitchen. CALCIUM ASPARTATE PO Take by mouth daily.    . diclofenac sodium (VOLTAREN) 1 % GEL     . hydrochlorothiazide (HYDRODIURIL) 25 MG tablet Take 25 mg by mouth daily.    . Memantine HCl-Donepezil HCl (NAMZARIC) 28-10 MG CP24 Take 1 capsule by mouth daily. 90 capsule 4  . Multiple Vitamins-Minerals (CENTRUM SILVER PO) Take by mouth daily.    Marland Kitchen. omeprazole (PRILOSEC) 20 MG capsule Take 20 mg by mouth daily.    . sertraline (ZOLOFT) 100 MG tablet TK 1 T PO ONCE A DAY  12   No facility-administered medications prior to visit.     PAST MEDICAL HISTORY: Past Medical History:  Diagnosis Date  . Anxiety   . High blood pressure   . High cholesterol     PAST SURGICAL HISTORY: Past Surgical History:  Procedure Laterality Date  . AURAL ATRESIA REPAIR     1983  . BACK SURGERY     1995  . eye implants     1999  . VAGINAL HYSTERECTOMY     1995    FAMILY HISTORY: Family History  Problem Relation Age of Onset  . Diabetes Brother   . Leukemia Sister     SOCIAL HISTORY: Social History   Social History  . Marital status: Widowed    Spouse name: N/A  . Number of children: 3  . Years of education: N/A   Occupational  History  . Not on file.   Social History Main Topics  . Smoking status: Never Smoker  . Smokeless tobacco: Never Used  . Alcohol use No  . Drug use: No  . Sexual activity: Not on file   Other Topics Concern  . Not on file   Social History Narrative   Living with 2 sons.       PHYSICAL EXAM  Vitals:   12/18/16 1325  BP: 114/62  Pulse: 63  Weight: 116 lb 3.2 oz (52.7 kg)  Height: 5\' 3"  (1.6 m)   Body mass index is 20.58 kg/m.  Generalized: Well developed, in no acute distress  Head: normocephalic and atraumatic,. Oropharynx benign  Neck: Supple, no carotid bruits  Cardiac: Regular rate rhythm, no murmur  Musculoskeletal: No deformity   Neurological examination   Mentation: Alert , AFT 6. Clock drawing 4/4 MMSE - Mini Mental State Exam 12/18/2016 06/10/2016 12/05/2015  Orientation to time 1 3 3   Orientation to Place 3 5 3   Registration 3 3 3   Attention/  Calculation 2 3 4   Recall 0 0 2  Language- name 2 objects 2 2 2   Language- repeat 1 1 1   Language- follow 3 step command 3 3 3   Language- read & follow direction 1 1 1   Write a sentence 1 1 1   Copy design 1 1 1   Total score 18 23 24   .  Follows all commands speech and language fluent.   Cranial nerve II-XII: .Pupils were equal round reactive to light extraocular movements were full, visual field were full on confrontational test. Facial sensation and strength were normal. hearing was intact to finger rubbing bilaterally. Uvula tongue midline. head turning and shoulder shrug were normal and symmetric.Tongue protrusion into cheek strength was normal. Motor: normal bulk and tone, full strength in the BUE, BLE, fine finger movements normal, no pronator drift. No focal weakness Sensory: normal and symmetric to light touch, on the face arms and legs Coordination: finger-nose-finger, heel-to-shin bilaterally, no dysmetria Reflexes: Brachioradialis 2/2, biceps 2/2, triceps 2/2, patellar 2/2, Achilles 2/2, plantar responses  were flexor bilaterally. Gait and Station: Rising up from seated position without assistance, normal stance,  moderate stride, good arm swing, smooth turning, Romberg negative DIAGNOSTIC DATA (LABS, IMAGING, TESTING) - I reviewed patient records, labs, notes, testing and imaging myself where available.  Lab Results  Component Value Date   WBC 5.9 09/06/2012   HGB 12.3 09/06/2012   HCT 38.2 09/06/2012   MCV 99 (H) 09/06/2012   PLT 214 09/06/2012      Component Value Date/Time   NA 142 09/06/2012 1122   K 4.1 09/06/2012 1122   CL 103 09/06/2012 1122   CO2 28 09/06/2012 1122   GLUCOSE 84 09/06/2012 1122   BUN 14 09/06/2012 1122   CREATININE 1.01 (H) 09/06/2012 1122   CALCIUM 9.6 09/06/2012 1122   PROT 6.9 09/06/2012 1122   ALBUMIN 3.9 09/06/2012 1122   AST 33 09/06/2012 1122   ALT 23 09/06/2012 1122   ALKPHOS 90 09/06/2012 1122   BILITOT 0.3 09/06/2012 1122   GFRNONAA 52 (L) 09/06/2012 1122   GFRAA 59 (L) 09/06/2012 1122    Lab Results  Component Value Date   VITAMINB12 1,074 (H) 09/06/2012   Lab Results  Component Value Date   TSH 0.542 09/06/2012      ASSESSMENT AND PLAN 83 y.o. year old female has a past medical history of dementia probable Alzheimer's dementia.  Continue Namzaric at current dose does not need refills Exercise memory by utilizing strategy games such as cards cross words, Scrabble etc. Be careful with ambulation at risk for falls especially going up and down stairs F/U in 6 months  Patient does not qualify for either of the memory studies in research Nilda RiggsNancy Carolyn Martin, [facility] Surgical CenterGNP, [facility] CenterBC, APRN  [facility] [address], [address] [place], [place] [address] [phone]

## 2017-01-27 IMAGING — CR DG LUMBAR SPINE 2-3V
3 series · 3 of 3 positions shown · non-contrast
Comparison: No priors.

CLINICAL DATA: 88-year-old female with generalize low back pain for
the past several days.

EXAM:
LUMBAR SPINE - 2-3 VIEW

[t l-spine a.p.]
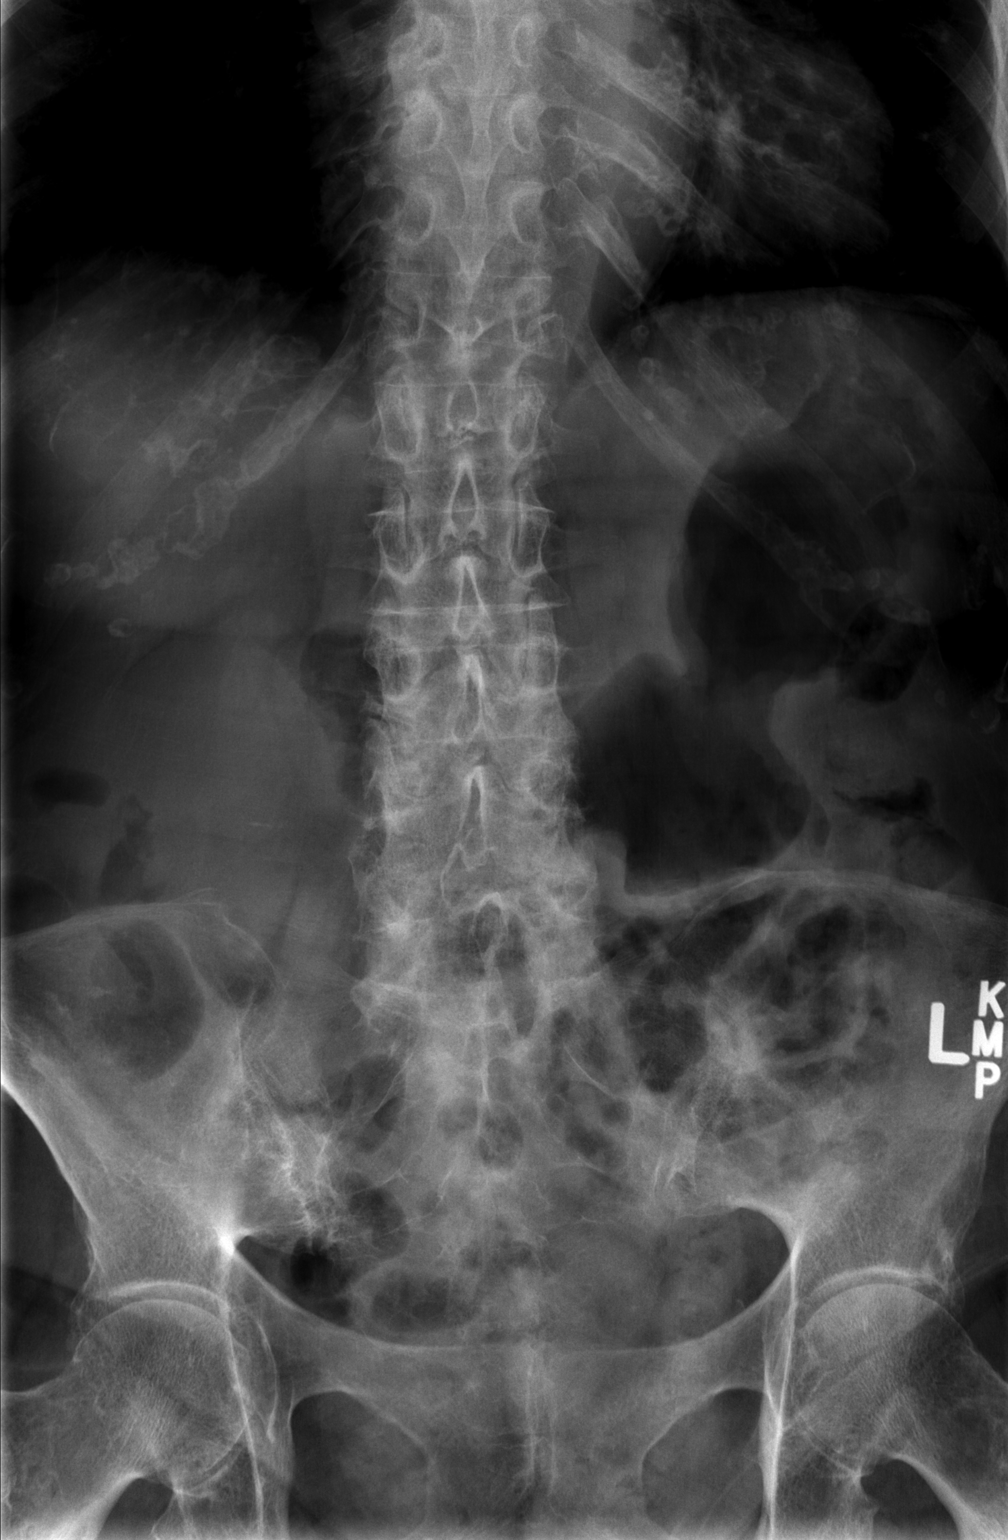

[t l-spine lat]
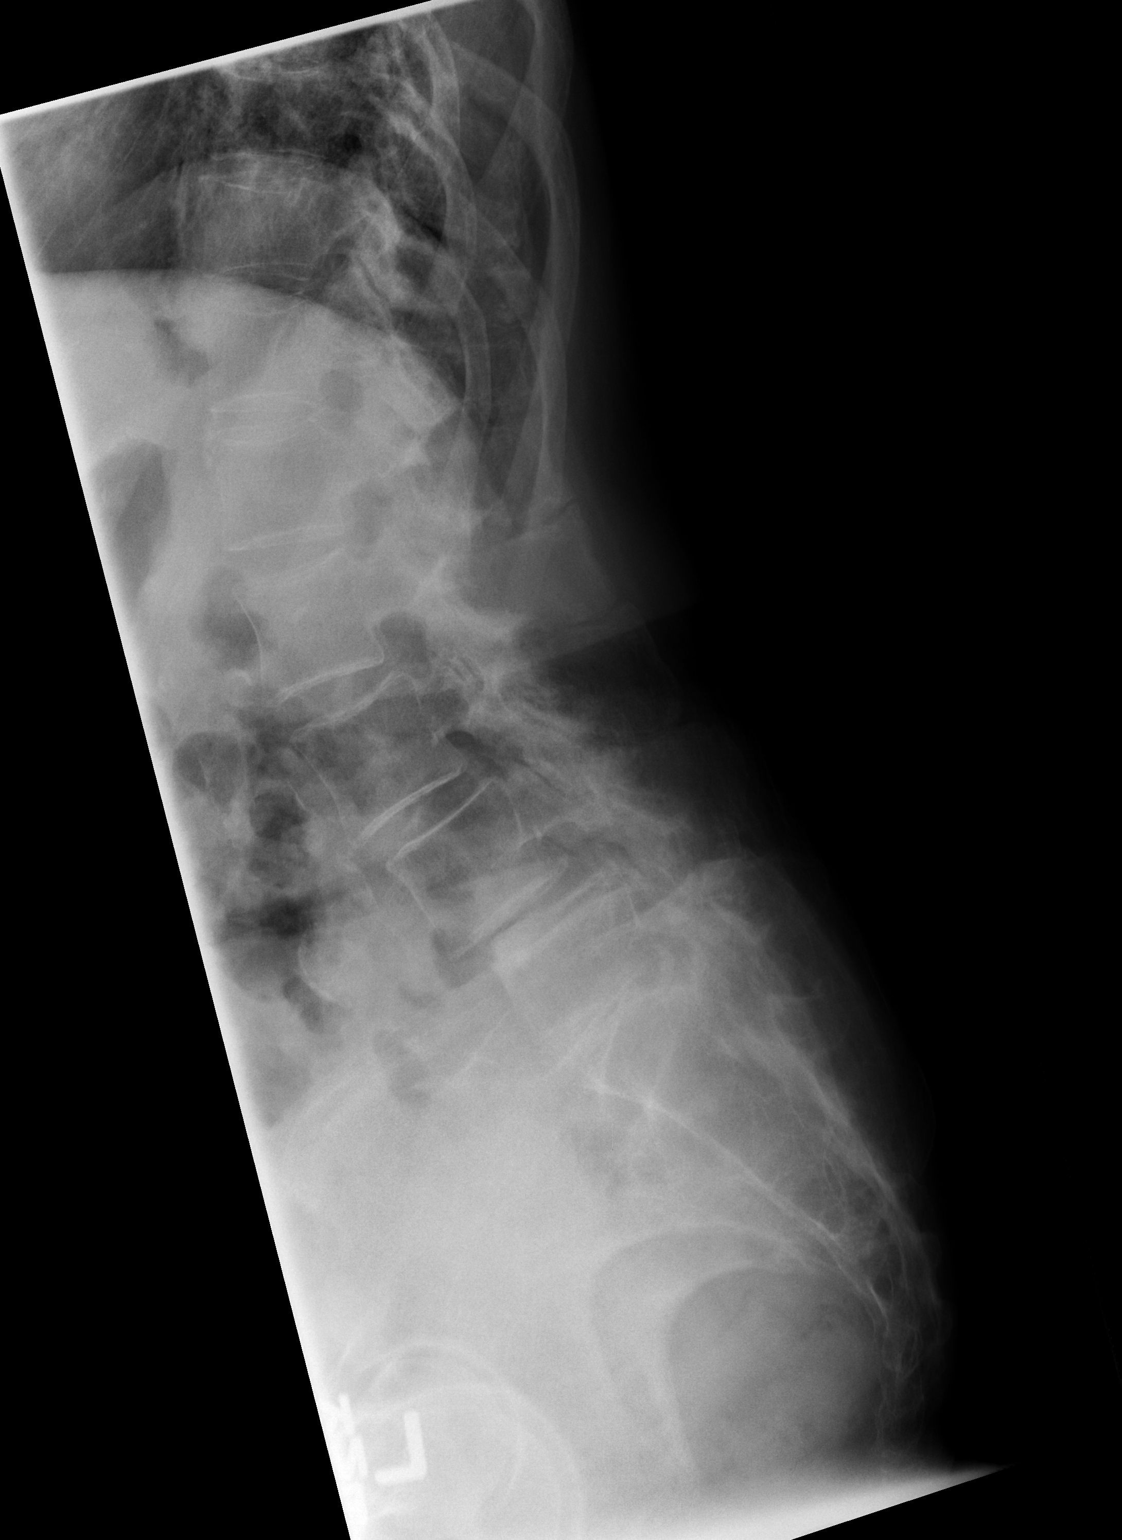

[t l-spine l5-s1 spot]
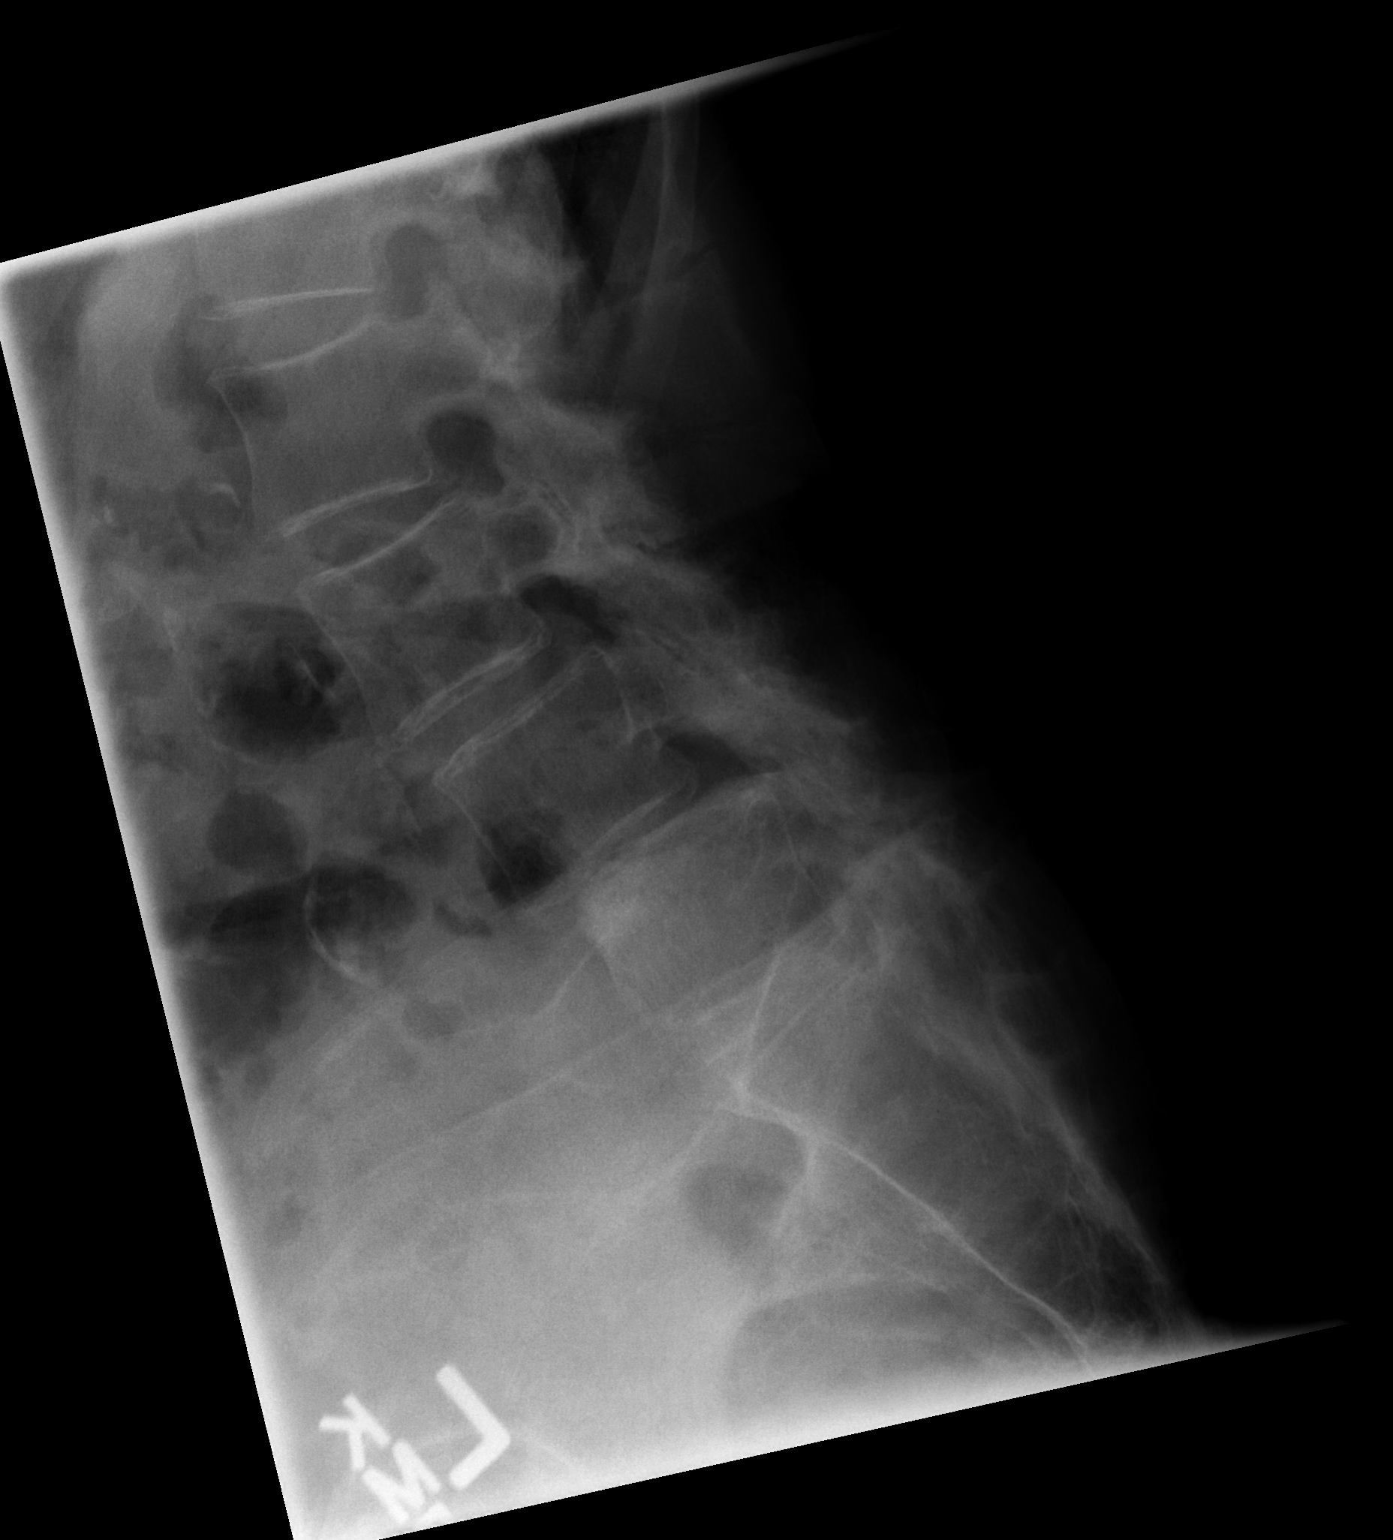

[3 of 3 positions shown; findings below may reference images not displayed]

FINDINGS: Three views of the lumbar spine demonstrate no acute displaced
fractures. 10 mm of anterolisthesis of L4 upon L5 are noted.
Alignment is otherwise anatomic. Multilevel degenerative disc
disease, most severe at L4-L5 and L5-S1. Severe multilevel facet
arthropathy.
IMPRESSION: 1. No acute radiographic abnormality of the lumbar spine.
2. Multilevel degenerative disc disease and lumbar spondylosis with
grade 1 anterolisthesis of L4 upon L5.

## 2017-02-19 ENCOUNTER — Ambulatory Visit (INDEPENDENT_AMBULATORY_CARE_PROVIDER_SITE_OTHER): Payer: Medicare Other | Admitting: Family

## 2017-02-19 ENCOUNTER — Ambulatory Visit (INDEPENDENT_AMBULATORY_CARE_PROVIDER_SITE_OTHER): Payer: Medicare Other

## 2017-02-19 ENCOUNTER — Encounter (INDEPENDENT_AMBULATORY_CARE_PROVIDER_SITE_OTHER): Payer: Self-pay | Admitting: Family

## 2017-02-19 DIAGNOSIS — S6010XA Contusion of unspecified finger with damage to nail, initial encounter: Secondary | ICD-10-CM | POA: Diagnosis not present

## 2017-02-19 DIAGNOSIS — M79644 Pain in right finger(s): Secondary | ICD-10-CM | POA: Diagnosis not present

## 2017-02-19 DIAGNOSIS — S62634A Displaced fracture of distal phalanx of right ring finger, initial encounter for closed fracture: Secondary | ICD-10-CM | POA: Diagnosis not present

## 2017-02-19 NOTE — Progress Notes (Signed)
Office Visit Note   Patient: Kelly Cross           Date of Birth: 02-16-1927           MRN: [id number] Visit Date: 02/19/2017              Requested by: No referring provider defined for this encounter. PCP: Patient, No Pcp Per  Chief Complaint  Patient presents with  . Right Ring Finger - Injury      HPI: The patient is a 83 year old woman seen today for evaluation of an injury to the tip of her right fourth finger. She was home alone last week while her spine was hospitalized. Unsure of specific injury may been shut at the window. Some soreness swelling and bruising to the tip of her finger. Was seen initially at [facility] yesterday placed in a finger splint.  Assessment & Plan: Visit Diagnoses:  1. Closed displaced fracture of distal phalanx of right ring finger, initial encounter   2. Pain in right finger(s)   3. Subungual hematoma of digit of hand, initial encounter     Plan: Placed in more comfortable wrist splint. Discussed risks and benefits of draining hematoma with patient and family, POA. Will hold at this time. Will follow up in office as needed. Monitor closely. Discussed that may lose the finger nail.  Follow-Up Instructions: Return if symptoms worsen or fail to improve.   Ortho Exam  Patient is alert, oriented, no adenopathy, well-dressed, normal affect, normal respiratory effort. Distal 4th finger on right is mildly tender to palpation. Swelling and ecchymosis distally. Does have subungual hematoma. No erythema, drainage. Has healed skin tear to distal palmar aspect of 4th finger. No warmth.   Imaging: No results found. No images are attached to the encounter.  Labs: Lab Results  Component Value Date   ESRSEDRATE CANCELED 09/06/2012   CRP 4.9 09/06/2012    Orders:  Orders Placed This Encounter  Procedures  . XR Hand Complete Right   No orders of the defined types were placed in this encounter.    Procedures: No procedures  performed  Clinical Data: No additional findings.  ROS:  All other systems negative, except as noted in the HPI. Review of Systems  Constitutional: Negative for chills and fever.  Musculoskeletal: Positive for myalgias.  Skin: Positive for color change. Negative for wound.    Objective: Vital Signs: There were no vitals taken for this visit.  Specialty Comments:  No specialty comments available.  PMFS History: Patient Active Problem List   Diagnosis Date Noted  . Chronic pain of right knee 05/15/2016  . Dementia of the Alzheimer's type without behavioral disturbance 10/11/2013  . Gait instability 10/11/2013  . Memory loss 09/06/2012  . Benign paroxysmal positional vertigo 09/06/2012  . High cholesterol   . Anxiety    Past Medical History:  Diagnosis Date  . Anxiety   . High blood pressure   . High cholesterol     Family History  Problem Relation Age of Onset  . Diabetes Brother   . Leukemia Sister     Past Surgical History:  Procedure Laterality Date  . AURAL ATRESIA REPAIR     1983  . BACK SURGERY     1995  . eye implants     1999  . VAGINAL HYSTERECTOMY     1995   Social History   Occupational History  . Not on file.   Social History Main Topics  . Smoking status: Never  Smoker  . Smokeless tobacco: Never Used  . Alcohol use No  . Drug use: No  . Sexual activity: Not on file

## 2017-03-09 NOTE — Telephone Encounter (Signed)
Close Encounter 

## 2017-03-26 ENCOUNTER — Telehealth: Payer: Self-pay | Admitting: *Deleted

## 2017-03-26 NOTE — Telephone Encounter (Signed)
Pt living physicians report form on Kelly Cross desk.

## 2017-03-27 NOTE — Telephone Encounter (Signed)
I called and and spoke to Sam in sales ([facility]) and relayed that we saw pt last 11/2016.  Per NP form to be done by pcp (much of this not assessed on visit).  She will relay to son.  The appt had been rescheduled until next week.  If we had any information that we could provide, it would be helpful.

## 2017-03-27 NOTE — Telephone Encounter (Signed)
[facility] assessment form to be filled out by pcp per  NP, (much of this was not assessed at mothers visit).  LMVM for son, Kelly Cross this am.  Glad to fax to pcp (?) Dr. Earl Galasborne. Has assessment at [facility] today at 1400.  Received form yesterday.

## 2017-04-01 NOTE — Telephone Encounter (Signed)
Son, Kelly Cross,  came in and was asking about the p/w for [facility].  I relayed that per NP that last visit did not assess the questions, and will need to go to pcp.  There was some back and forth with pcp and [facility] but I think finally got the information.   They relayed that they would like information from us and I offered last ofv note.  He was fine with this.  I will fax to them.  Done, fax confirmation received. [phone].

## 2017-06-19 NOTE — Progress Notes (Signed)
[facility]  PATIENT: Kelly Cross DOB: Sep 09, 1926   REASON FOR VISIT: follow up for dementia HISTORY FROM:patient and son    HISTORY OF PRESENT ILLNESS:Kelly Cross is a 84 years old right-handed African American female, accompanied by her son, referred by her primary care physician for evaluation of memory loss.   She has past medical history of pulmonary sarcoidosis, was treated with steroid more than 40 years ago, also history of hypertension, anxiety, bilateral cataract surgery, she had college degree, taught sixth-grade transiently, was a homemaker,, but very actively involved in church, community service, her son stated that she really the captain of her family, has been taking care of her sick husband, who suffered Alzheimer's disease, her son who has schizophrenia, was dedicated.   She was noted to get frustrated easily over the past one year, which is not her normal self, also began to make mistakes, she forgets to take her medications sometimes, but she is still very active at home, she is supervising her son, who suffers schizophrenia, she is his main caregiver, supervising his medications, she is driving only short distance, but even that she got lost sometimes, she still pay home bills, but her son began to step in to help her  She denies significant visual loss, no lateralized motor or sensory deficit, her son provide most of the story  MRI of the brain showed mild atrophy, laboratory evaluation was normal, including B12. Son reported, she was highly functional, now struggling with her daily activity, no longer driving, she also has hard of hearing  UPDATE Feb 13 2014: She continue decline with her memory, increased confusion, also mild unsteady gait, complains of frequent low back pain, taking Tylenol almost every night to sleep, she used to take gabapentin 300 mg twice a day for restless leg syndrome, was not sure it has been helpful, she is  taking Namenda, and Aricept. She still takes care of her younger son, who suffered Schizophrenic. UPDATE December 04 2014: She is with her son [name] at today's visit She is taking Namazaric, tolerating it well, her son son is concerned about her increase the memory trouble, she has more trouble handling daily activity, such as cooking, keep up with her medications, she put Vaseline inside her mouth for her dry mouth, she is very hesitant to accept home help, which is well covered under her long-term care  UPDATE 06/06/2015; Kelly Cross, 84 year old female returns for follow-up she was last seen in the office 12/04/2014 by Dr. Terrace ArabiaYan. She is accompanied by her son. She is currently taking Namazaric without side effects. She no longer drives. She can feed and dress herself, she needs assistance with bathing. There have been no falls since last seen. Her bedroom is upstairs. She is no longer cooking. She returns for reevaluation. Son has multiple questions about providing the best care for her. Records were reviewed UPDATE 07/12/2017CM Kelly Cross, 84 year old female returns for follow-up for memory loss, Alzheimer's dementia. She is currently taking Namzaric without side effects. She needs assistance with bathing but she can feed and dress herself. She no longer drives, she no longer cooks. She reads books. She returns for reevaluation  UPDATE Jan 16th 2018:YY She still lives with her two sons, she has quit driving in [id number], she get up every morning to fix breakfast, fix dinner, she rarely goes to stove anymore, she tried to clean the house, she walks around, trying to find misplaced things.  She still has other 7 siblings, from  age 14-96, her son Burton Apley has insulin dependent DM, her MMSE is 23/30,  UPDATE 07/26/2018CM. Kelly Cross, 84 year old female returns for follow-up with her son for dementia. She remains on Namzaric daily. She continues to live in her own home. Her son lives with her.appetite is good, she  sleeps well, no agitation or behavior outbursts. She can feed and dress herself. She no longer cooks or drives. She returns for reevaluation UPDATE 1/28/2019CM Kelly Cross, 84 year old female returns for follow-up with history of dementia.  When last seen she was living in her own home with supervision.  She is now moved to [facility]  in 1 of their cottages.  Her son is staying with her at present for transition.  She remains on Namzaric without side effects.  No agitation or behavior outbursts.  Appetite is good and she is sleeping well.  Recently seen by urologist for prolapsed bladder.  No recent falls.  She no longer drives or cooks.  She returns for reevaluation with her son REVIEW OF SYSTEMS: Full 14 system review of systems performed and notable only for those listed, all others are neg:  Constitutional: neg  Cardiovascular: neg Ear/Nose/Throat: hearing loss  Skin: neg Eyes: neg Respiratory: neg Gastroitestinal: neg  Genitourinary incontinence of bladder, prolapse Hematology/Lymphatic: neg  Endocrine: neg Musculoskeletal:neg Allergy/Immunology: neg Neurological: memory loss Psychiatric: neg Sleep : neg   ALLERGIES: No Known Allergies  HOME MEDICATIONS: Outpatient Medications Prior to Visit  Medication Sig Dispense Refill  . Acetaminophen (TYLENOL EXTRA STRENGTH PO) Take 500 mg by mouth.     Marland Kitchen alendronate (FOSAMAX) 70 MG tablet 70 mg every 7 (seven) days.    . BELSOMRA 10 MG TABS at bedtime.     Marland Kitchen CALCIUM ASPARTATE PO Take by mouth daily.    . diclofenac sodium (VOLTAREN) 1 % GEL     . hydrochlorothiazide (HYDRODIURIL) 25 MG tablet Take 25 mg by mouth daily.    . Memantine HCl-Donepezil HCl (NAMZARIC) 28-10 MG CP24 Take 1 capsule by mouth daily. 90 capsule 4  . Multiple Vitamins-Minerals (CENTRUM SILVER PO) Take by mouth daily.    Marland Kitchen omeprazole (PRILOSEC) 20 MG capsule Take 20 mg by mouth daily.    Bertram Gala Glycol-Propyl Glycol (SYSTANE) 0.4-0.3 % GEL ophthalmic gel  Place into both eyes nightly.    . sertraline (ZOLOFT) 100 MG tablet TK 1 T PO ONCE A DAY  12  . UNABLE TO FIND Med Name:Biotene for mouth moisture     No facility-administered medications prior to visit.     PAST MEDICAL HISTORY: Past Medical History:  Diagnosis Date  . Alzheimer's dementia   . Anxiety   . High blood pressure   . High cholesterol     PAST SURGICAL HISTORY: Past Surgical History:  Procedure Laterality Date  . AURAL ATRESIA REPAIR     1983  . BACK SURGERY     1995  . eye implants     1999  . VAGINAL HYSTERECTOMY     1995    FAMILY HISTORY: Family History  Problem Relation Age of Onset  . Diabetes Brother   . Leukemia Sister     SOCIAL HISTORY: Social History   Socioeconomic History  . Marital status: Widowed    Spouse name: Not on file  . Number of children: 3  . Years of education: Not on file  . Highest education level: Not on file  Social Needs  . Financial resource strain: Not on file  . Food insecurity -  worry: Not on file  . Food insecurity - inability: Not on file  . Transportation needs - medical: Not on file  . Transportation needs - non-medical: Not on file  Occupational History  . Not on file  Tobacco Use  . Smoking status: Never Smoker  . Smokeless tobacco: Never Used  Substance and Sexual Activity  . Alcohol use: No  . Drug use: No  . Sexual activity: Not on file  Other Topics Concern  . Not on file  Social History Narrative   Living with 2 sons.       PHYSICAL EXAM  Vitals:   06/22/17 1435  BP: (!) 121/56  Pulse: (!) 54  Weight: 113 lb 12.8 oz (51.6 kg)   Body mass index is 20.16 kg/m.  Generalized: Well developed, in no acute distress  Head: normocephalic and atraumatic,. Oropharynx benign  Neck: Supple, no carotid bruits  Cardiac: Regular rate rhythm, no murmur  Musculoskeletal: No deformity   Neurological examination   Mentation: Alert , AFT 8. Clock drawing 2/4 MMSE - Mini Mental State Exam  06/22/2017 12/18/2016 06/10/2016  Orientation to time 0 1 3  Orientation to Place 3 3 5   Registration 3 3 3   Attention/ Calculation 1 2 3   Recall 0 0 0  Language- name 2 objects 2 2 2   Language- repeat 0 1 1  Language- follow 3 step command 3 3 3   Language- read & follow direction 1 1 1   Write a sentence 1 1 1   Copy design 1 1 1   Total score 15 18 23   .  Follows all commands speech and language fluent.   Cranial nerve II-XII: .Pupils were equal round reactive to light extraocular movements were full, visual field were full on confrontational test. Facial sensation and strength were normal. hearing was intact to finger rubbing bilaterally. Uvula tongue midline. head turning and shoulder shrug were normal and symmetric.Tongue protrusion into cheek strength was normal. Motor: normal bulk and tone, full strength in the BUE, BLE, fine finger movements normal, no pronator drift. No focal weakness Sensory: normal and symmetric to light touch, on the face arms and legs Coordination: finger-nose-finger,  no dysmetria Reflexes: Symmetric upper and lower, plantar responses were flexor bilaterally. Gait and Station: Rising up from seated position without assistance, normal stance,  moderate stride, good arm swing, smooth turning, Romberg negative.  DIAGNOSTIC DATA (LABS, IMAGING, TESTING) - I reviewed patient records, labs, notes, testing and imaging myself where available.  Lab Results  Component Value Date   WBC 5.9 09/06/2012   HGB 12.3 09/06/2012   HCT 38.2 09/06/2012   MCV 99 (H) 09/06/2012   PLT 214 09/06/2012      Component Value Date/Time   NA 142 09/06/2012 1122   K 4.1 09/06/2012 1122   CL 103 09/06/2012 1122   CO2 28 09/06/2012 1122   GLUCOSE 84 09/06/2012 1122   BUN 14 09/06/2012 1122   CREATININE 1.01 (H) 09/06/2012 1122   CALCIUM 9.6 09/06/2012 1122   PROT 6.9 09/06/2012 1122   ALBUMIN 3.9 09/06/2012 1122   AST 33 09/06/2012 1122   ALT 23 09/06/2012 1122   ALKPHOS 90  09/06/2012 1122   BILITOT 0.3 09/06/2012 1122   GFRNONAA 52 (L) 09/06/2012 1122   GFRAA 59 (L) 09/06/2012 1122    Lab Results  Component Value Date   VITAMINB12 1,074 (H) 09/06/2012   Lab Results  Component Value Date   TSH 0.542 09/06/2012      ASSESSMENT AND  PLAN 84 y.o. year old female has a past medical history of dementia probable Alzheimer's dementia.  Continue Namzaric at current dose will refill Exercise memory by utilizing strategy games such as cards cross words, Scrabble etc. Be careful with ambulation at risk for falls especially going up and down stairs F/U yearly Patient does not qualify for either of the memory studies in research Nilda Riggs, [facility], [facility], APRN  [facility] [address] [place], [place] [address] [phone]

## 2017-06-22 ENCOUNTER — Encounter: Payer: Self-pay | Admitting: Nurse Practitioner

## 2017-06-22 ENCOUNTER — Ambulatory Visit: Payer: Medicare Other | Admitting: Nurse Practitioner

## 2017-06-22 VITALS — BP 121/56 | HR 54 | Wt 113.8 lb

## 2017-06-22 DIAGNOSIS — F0280 Dementia in other diseases classified elsewhere without behavioral disturbance: Secondary | ICD-10-CM | POA: Diagnosis not present

## 2017-06-22 DIAGNOSIS — G309 Alzheimer's disease, unspecified: Secondary | ICD-10-CM | POA: Diagnosis not present

## 2017-06-22 MED ORDER — MEMANTINE HCL-DONEPEZIL HCL 28-10 MG PO CP24
1.0000 | ORAL_CAPSULE | Freq: Every day | ORAL | 3 refills | Status: DC
Start: 2017-06-22 — End: 2019-11-28

## 2017-06-22 NOTE — Patient Instructions (Signed)
Continue Namzaric at current dose will refill Exercise memory by utilizing strategy games such as cards cross words, Scrabble etc. Be careful with ambulation at risk for falls especially going up and down stairs F/U yearly

## 2017-06-22 NOTE — Progress Notes (Signed)
I have reviewed and agreed above plan. 

## 2017-11-10 ENCOUNTER — Encounter (HOSPITAL_COMMUNITY): Payer: Self-pay | Admitting: *Deleted

## 2017-11-10 ENCOUNTER — Inpatient Hospital Stay (HOSPITAL_COMMUNITY)
Admission: AD | Admit: 2017-11-10 | Discharge: 2017-11-10 | Disposition: A | Discharge status: Home / Self Care | Payer: Medicare Other | Source: Ambulatory Visit | Attending: Obstetrics & Gynecology | Admitting: Obstetrics & Gynecology

## 2017-11-10 DIAGNOSIS — Z90710 Acquired absence of both cervix and uterus: Secondary | ICD-10-CM | POA: Diagnosis not present

## 2017-11-10 DIAGNOSIS — D869 Sarcoidosis, unspecified: Secondary | ICD-10-CM | POA: Diagnosis not present

## 2017-11-10 DIAGNOSIS — E7800 Pure hypercholesterolemia, unspecified: Secondary | ICD-10-CM | POA: Insufficient documentation

## 2017-11-10 DIAGNOSIS — G309 Alzheimer's disease, unspecified: Secondary | ICD-10-CM | POA: Insufficient documentation

## 2017-11-10 DIAGNOSIS — Z79899 Other long term (current) drug therapy: Secondary | ICD-10-CM | POA: Diagnosis not present

## 2017-11-10 DIAGNOSIS — R32 Unspecified urinary incontinence: Secondary | ICD-10-CM | POA: Diagnosis present

## 2017-11-10 DIAGNOSIS — K623 Rectal prolapse: Secondary | ICD-10-CM | POA: Diagnosis present

## 2017-11-10 DIAGNOSIS — F419 Anxiety disorder, unspecified: Secondary | ICD-10-CM | POA: Insufficient documentation

## 2017-11-10 HISTORY — DX: Sarcoidosis, unspecified: D86.9

## 2017-11-10 MED ORDER — DOCUSATE SODIUM 100 MG PO CAPS: 100.0000 mg | ORAL_CAPSULE | Freq: Two times a day (BID) | ORAL | 2 refills | Status: DC

## 2017-11-10 NOTE — Discharge Instructions (Signed)
Rectal Prolapse, Adult Rectal prolapse happens when the inside of the final section of the large intestine (rectum) pushes out through the anal opening. With this condition, the lower part of the rectum turns inside out. At first, rectal prolapse may be temporary. It may happen only when you are having a bowel movement. Over time, the prolapse will likely get worse. It may start to happen more often and cause uncomfortable symptoms. Eventually, the prolapse may happen when you are walking or simply standing. Surgery is often needed for this condition. What are the causes? This condition may result from weakness of the muscles that attach the rectum to the inside of the lower abdomen. The exact cause of this muscle weakness is not known. What increases the risk? This condition is more likely to develop in:  Women who are 50 years of age or older.  People with a history of constipation.  People with a history of hemorrhoids.  People who have a lower spinal cord injury.  Women who have been pregnant many times.  People who have had rectal surgery.  Men who have an enlarged prostate gland.  People who have chronic obstructive pulmonary disease (COPD).  People who have cystic fibrosis.  What are the signs or symptoms? The main symptom of this condition is a red bump of tissue sticking out from your anus. At first, the bump may only appear after a bowel movement. It may then start to appear more often. Other symptoms may include:  Discomfort in the anus and rectum.  Constipation.  Diarrhea.  Inability to control bowel movements (incontinence).  Rectal bleeding.  How is this diagnosed? This condition may be diagnosed based on your symptoms and a physical exam. During the exam, you may be asked to squat and strain as though you are having a bowel movement. You may also have tests, such as:  A rectal exam using a flexible scope (sigmoidoscopy or colonoscopy).  A procedure that  involves taking X-rays of your rectum after a dye (contrast material) is injected into the rectum (defecogram).  How is this treated? This condition is usually treated with surgery to repair the weakened muscles and to reconnect the rectum to attachments inside the lower abdomen. Other treatment options may include:  Pushing the prolapsed area back into the rectum (reduction). Your health care provider may do this by gently pushing it back in using a moist cloth. The health care provider may also show you how to do this at home if the prolapse occurs again.  Medicines to prevent constipation and straining. This may include laxatives or stool softeners.  Follow these instructions at home: General instructions  Take over-the-counter and prescription medicines only as told by your health care provider.  Do not strain to have a bowel movement.  Do not lift anything that is heavier than 10 lb (4.5 kg).  Follow instructions from your health care provider about what to do if the prolapse occurs again and does not go back in. This may involve lying on your side and using a moist cloth to gently press the lump into your rectum.  Keep all follow-up visits as told by your health care provider. This is important. Preventing Constipation  Eat foods that have a lot of fiber, such as fruits, vegetables, whole grains, and beans.  Limit foods high in fat and processed sugars, such as french fries, hamburgers, cookies, candies, and soda.  Drink enough fluids to keep your urine clear or pale yellow. Contact a   health care provider if:  You have a fever.  Your prolapse cannot be reduced at home.  You have constipation or diarrhea.  You have mild rectal bleeding. Get help right away if:  You have very bad rectal pain.  You bleed heavily from your rectum. This information is not intended to replace advice given to you by your health care provider. Make sure you discuss any questions you have with  your health care provider. Document Released: 01/31/2015 Document Revised: 10/18/2015 Document Reviewed: 05/31/2014 Elsevier Interactive Patient Education  2018 Elsevier Inc.  

## 2017-11-10 NOTE — MAU Note (Signed)
Pt has known prolapsed bladder, [facility], nurse there noticed prolapse has become worse.

## 2017-11-10 NOTE — MAU Note (Signed)
Put patients bed rails up.

## 2017-11-10 NOTE — MAU Provider Note (Signed)
[facility] MAU Attending Note  History    CSN: [id number]  Arrival date & time 11/10/17  1044  First Provider Initiated Contact with Patient 11/10/17 1154      Chief Complaint  Patient presents with  . prolapsed bladder    Kelly Cross is a 84 y.o. G3P3 PMP who presents to MAU today for evaluation of prolapsed bladder. She is brought in by her son; patient has a history of Alzheimer and lives in an assisted facility.  History obtained from son; he is her health proxy and power of attorney.  Patient reportedly has a history of urinary incontinence and bladder prolapse, and [facility]  Patient was evaluated by nursing staff at the home today, noted to have worsening prolapse. Ruled out for UTI or other acute situation.  No abnormal vaginal discharge, fevers, chills, sweats, dysuria, nausea, vomiting, other GI or GU symptoms or other general symptoms.  Her son called [facility] and was told the patient needs gynecologic evaluation so she was brought here.   OB History  Gravida Para Term Preterm AB Living  3 3 0 0 0 3  SAB TAB Ectopic Multiple Live Births  0 0 0 0 0    # Outcome Date GA Lbr Len/2nd Weight Sex Delivery Anes PTL Lv  3 Para           2 Para           1 Para             Past Medical History:  Diagnosis Date  . Alzheimer's dementia   . Anxiety   . High blood pressure   . High cholesterol   . Sarcoidosis     Past Surgical History:  Procedure Laterality Date  . AURAL ATRESIA REPAIR     1983  . BACK SURGERY     1995  . eye implants     1999  . VAGINAL HYSTERECTOMY     1995    Family History  Problem Relation Age of Onset  . Diabetes Brother   . Leukemia Sister     Social History   Tobacco Use  . Smoking status: Never Smoker  . Smokeless tobacco: Never Used  Substance Use Topics  . Alcohol use: No  . Drug use: No    No Known Allergies  Medications Prior to Admission  Medication  Sig Dispense Refill Last Dose  . Acetaminophen (TYLENOL EXTRA STRENGTH PO) Take 500 mg by mouth.    Taking  . alendronate (FOSAMAX) 70 MG tablet 70 mg every 7 (seven) days.   Taking  . BELSOMRA 10 MG TABS at bedtime.    Taking  . CALCIUM ASPARTATE PO Take by mouth daily.   Taking  . diclofenac sodium (VOLTAREN) 1 % GEL    Taking  . hydrochlorothiazide (HYDRODIURIL) 25 MG tablet Take 25 mg by mouth daily.   Taking  . Memantine HCl-Donepezil HCl (NAMZARIC) 28-10 MG CP24 Take 1 capsule by mouth daily. 90 capsule 3   . Multiple Vitamins-Minerals (CENTRUM SILVER PO) Take by mouth daily.   Taking  . omeprazole (PRILOSEC) 20 MG capsule Take 20 mg by mouth daily.   Taking  . Polyethyl Glycol-Propyl Glycol (SYSTANE) 0.4-0.3 % GEL ophthalmic gel Place into both eyes nightly.   Taking  . sertraline (ZOLOFT) 100 MG tablet TK 1 T PO ONCE A DAY  12 Taking  . UNABLE TO FIND Med Name:Biotene for mouth moisture  Taking     Physical Exam  BP (!) 150/60 (BP Location: Left Arm)   Pulse (!) 49   Temp 98.2 F (36.8 C) (Oral)   Resp 16   SpO2 96% Comment: room air GENERAL: No acute distress  SKIN: Warm, dry and without erythema PSYCH: Normal mood and affect HEENT: Normocephalic, atraumatic.   LUNGS: Normal respiratory effort, normal breath sounds HEART: Regular rate noted ABDOMEN: Soft, nondistended, nontender PELVIC: NEFG with moderate atrophy, no external bladder prolapse/cystocele noted, no distal vaginal discharge. Speculum exam deferred.  Beefy, red 5 x 4 cm prolapsed rectum noted, easily reduced during exam. Intact anal sphincter on exam.  EXTREMITIES: No edema, no cyanosis, normal range of movement  MAU Course/MDM  Rectal prolapse was able to be easily reduced during pelvic exam. No other intervention needed. Colace prescribed; also advised patient to avoid constipation or anything that increases intraabdominal pressure.  Labs and Imaging   No results found for this or any previous visit  (from the past 24 hour(s)).  Assessment and Plan   1. Rectal mucosa prolapse    Upon further discussion with her son, he reports that this has occurred in the past especially after constipation.  He understands need for evaluation by rectal specialist; and need to avoid constipation. Colace prescribed. Also emphasized that this was rectal prolapse, not bladder prolapse.  Appointment made for patient at [facility] with Dr. Romie Levee on 11/17/17 at 11:10 am; this was communicated to her son. Was told to return [facility] or [facility] for any rectal pain, bleeding or other concerns, or if her condition were to change or worsen. Discharged to home with her son in stable condition    Allergies as of 11/10/2017   No Known Allergies     Medication List    TAKE these medications   alendronate 70 MG tablet Commonly known as:  FOSAMAX 70 mg every 7 (seven) days.   BELSOMRA 10 MG Tabs Generic drug:  Suvorexant at bedtime.   CALCIUM ASPARTATE PO Take by mouth daily.   CENTRUM SILVER PO Take by mouth daily.   diclofenac sodium 1 % Gel Commonly known as:  VOLTAREN   docusate sodium 100 MG capsule Commonly known as:  COLACE Take 1 capsule (100 mg total) by mouth 2 (two) times daily.   hydrochlorothiazide 25 MG tablet Commonly known as:  HYDRODIURIL Take 25 mg by mouth daily.   Memantine HCl-Donepezil HCl 28-10 MG Cp24 Commonly known as:  NAMZARIC Take 1 capsule by mouth daily.   omeprazole 20 MG capsule Commonly known as:  PRILOSEC Take 20 mg by mouth daily.   Polyethyl Glycol-Propyl Glycol 0.4-0.3 % Gel ophthalmic gel Commonly known as:  SYSTANE Place into both eyes nightly.   sertraline 100 MG tablet Commonly known as:  ZOLOFT TK 1 T PO ONCE A DAY   TYLENOL EXTRA STRENGTH PO Take 500 mg by mouth.   UNABLE TO FIND Med Name:Biotene for mouth moisture        Jaynie Collins, MD, FACOG Attending Obstetrician & Gynecologist, [facility]  for [employer], [facility]

## 2017-11-10 NOTE — Progress Notes (Signed)
[facility] in retirement community, has assistance with ADL's by staff, pt's son providing most of her information.

## 2018-03-02 ENCOUNTER — Ambulatory Visit: Payer: Self-pay | Admitting: General Surgery

## 2018-03-02 NOTE — H&P (Signed)
History of Present Illness Romie Levee MD; 03/02/2018 10:44 AM) The patient is a 84 year old female who presents with rectal prolapse. 84 year old female with Alzheimer's disease who lives in a nursing facility presents to the office with complaints of rectal prolapse. She has been diagnosed with a bladder prolapse previously by Dr. McDiarmidt. They have apparently discussed a minimally invasive procedure versus watchful waiting. Since that time she has had at least 2 episodes of what was felt to be rectal prolapse. It does appear that the patient has some constipation at baseline. Her last colonoscopy was in 2011 and this showed a few small polyps, which were resected. Since I last saw her, she has developed more prolapse and more fecal incontinence.   Problem List/Past Medical Romie Levee, MD; 03/02/2018 10:42 AM) COMPLETE RECTAL PROLAPSE WITH DISPLACEMENT OF ANAL SPHINCTER (K62.3)  Past Surgical History Romie Levee, MD; 03/02/2018 10:42 AM) Cataract Surgery Bilateral. Colon Polyp Removal - Colonoscopy Oral Surgery Sentinel Lymph Node Biopsy Spinal Surgery - Lower Back  Diagnostic Studies History Romie Levee, MD; 03/02/2018 10:42 AM) Colonoscopy 1-5 years ago Mammogram >3 years ago Pap Smear >5 years ago  Allergies (Tanisha A. Manson Passey, RMA; 03/02/2018 10:29 AM) No Known Drug Allergies [11/17/2017]: Allergies Reconciled  Medication History (Tanisha A. Manson Passey, RMA; 03/02/2018 10:29 AM) Sertraline HCl (100MG  Tablet, Oral) Active. Belsomra (10MG  Tablet, Oral) Active. Alendronate Sodium (70MG  Tablet, Oral) Active. HydroCHLOROthiazide (25MG  Tablet, Oral) Active. Namzaric (28-10MG  Capsule ER 24HR, Oral) Active. Potassium Chloride ( Packet, Oral) Active. Medications Reconciled  Social History Romie Levee, MD; 03/02/2018 10:42 AM) Caffeine use Carbonated beverages, Coffee. No alcohol use No drug use Tobacco use Never smoker.  Family History  Romie Levee, MD; 03/02/2018 10:42 AM) Cancer Father, Sister. Cerebrovascular Accident Son. Diabetes Mellitus Son. Hypertension Son. Kidney Disease Son.  Pregnancy / Birth History Romie Levee, MD; 03/02/2018 10:42 AM) Rhett Bannister 3 Maternal age 54-25 Para 3 Regular periods  Other Problems Romie Levee, MD; 03/02/2018 10:42 AM) Anxiety Disorder Arthritis Bladder Problems Hypercholesterolemia Other disease, cancer, significant illness     Review of Systems Romie Levee MD; 03/02/2018 10:43 AM) General Not Present- Appetite Loss, Chills, Fatigue, Fever, Night Sweats, Weight Gain and Weight Loss. Skin Not Present- Change in Wart/Mole, Dryness, Hives, Jaundice, New Lesions, Non-Healing Wounds, Rash and Ulcer. HEENT Present- Hearing Loss and Wears glasses/contact lenses. Not Present- Earache, Hoarseness, Nose Bleed, Oral Ulcers, Ringing in the Ears, Seasonal Allergies, Sinus Pain, Sore Throat, Visual Disturbances and Yellow Eyes. Gastrointestinal Present- Change in Bowel Habits and Constipation. Not Present- Abdominal Pain, Bloating, Bloody Stool, Chronic diarrhea, Difficulty Swallowing, Excessive gas, Gets full quickly at meals, Hemorrhoids, Indigestion, Nausea, Rectal Pain and Vomiting. Female Genitourinary Present- Frequency. Not Present- Nocturia, Painful Urination, Pelvic Pain and Urgency. Neurological Present- Decreased Memory. Not Present- Fainting, Headaches, Numbness, Seizures, Tingling, Tremor, Trouble walking and Weakness. Psychiatric Present- Anxiety. Not Present- Bipolar, Change in Sleep Pattern, Depression, Fearful and Frequent crying. Endocrine Present- Cold Intolerance. Not Present- Excessive Hunger, Hair Changes, Heat Intolerance, Hot flashes and New Diabetes.  Vitals (Tanisha A. Brown RMA; 03/02/2018 10:29 AM) 03/02/2018 10:28 AM Weight: 115.8 lb Height: 63in Body Surface Area: 1.53 m Body Mass Index: 20.51 kg/m  Temp.: 97.60F  Pulse: 82  (Regular)  BP: 124/72 (Sitting, Left Arm, Standard)      Physical Exam Romie Levee MD; 03/02/2018 11:14 AM)  General Mental Status-Alert. General Appearance-Not in acute distress. Build & Nutrition-Well nourished. Posture-Normal posture. Gait-Normal.  Head and Neck Head-normocephalic, atraumatic with no lesions or palpable masses.  Trachea-midline.  Chest and Lung Exam Chest and lung exam reveals -on auscultation, normal breath sounds, no adventitious sounds and normal vocal resonance.  Cardiovascular Cardiovascular examination reveals -normal heart sounds, regular rate and rhythm with no murmurs and no digital clubbing, cyanosis, edema, increased warmth or tenderness.  Abdomen Inspection Inspection of the abdomen reveals - No Hernias. Palpation/Percussion Palpation and Percussion of the abdomen reveal - Soft, Non Tender, No Rigidity (guarding), No hepatosplenomegaly and No Palpable abdominal masses.  Rectal Anorectal Exam External - Note: Full-thickness rectal prolapse, easily reducible.  Neurologic Neurologic evaluation reveals -alert and oriented x 3 with no impairment of recent or remote memory, normal attention span and ability to concentrate, normal sensation and normal coordination.  Musculoskeletal Normal Exam - Bilateral-Upper Extremity Strength Normal and Lower Extremity Strength Normal.    Assessment & Plan Romie Levee MD; 03/02/2018 11:18 AM)  COMPLETE RECTAL PROLAPSE WITH DISPLACEMENT OF ANAL SPHINCTER (K62.3) Impression: Patient continues to have difficulty with fecal incontinence and prolapse. It was recommended that she proceed with surgery. We have once again discussed her options. We discussed that she could benefit from a perineal repair and all marked procedure but this would need [facility] be [facility] a [facility] setting. We discussed that she will most likely be incontinent after this procedure as well but he has declined any  further evaluation for this. I think her best option would be a rectopexy with end colostomy. They seem to be more amenable to this now. We will go ahead and get medical clearance from her doctors for surgery and then proceed with a robotic-assisted rectopexy and end colostomy. The surgery and anatomy were described to the patient as well as the risks of surgery and the possible complications. These include: Bleeding, deep abdominal infections and possible wound complications such as hernia and infection, damage to adjacent structures, leak of surgical connections, which can lead to other surgeries and possibly an ostomy, possible need for other procedures, such as abscess drains in radiology, possible prolonged hospital stay, possible diarrhea from removal of part of the colon, possible constipation from narcotics, possible bowel, bladder or sexual dysfunction if having rectal surgery, prolonged fatigue/weakness or appetite loss, possible early recurrence of of disease, possible complications of their medical problems such as heart disease or arrhythmias or lung problems, death (less than 1%). I believe the patient understands and wishes to proceed with the surgery.

## 2018-03-05 ENCOUNTER — Other Ambulatory Visit: Payer: Self-pay | Admitting: Obstetrics & Gynecology

## 2018-04-13 NOTE — Patient Instructions (Signed)
Kelly Cross  04/13/2018   Your procedure is scheduled on: Friday  04/30/2018  Report to [facility] Main  Entrance              Report to admitting at   0530 AM    Call this number if you have problems the morning of surgery [phone]               Remember: No solid food after 04/28/2018 Wednesday as you will be doing your Bowel Prep on Thursday 04/29/2018!               Follow Bowel Prep instructions from Dr. Maisie Fus the day before surgery on 04/29/2018 and drink a clear liquid diet all day.    DRINK 2 PRESURGERY ENSURE DRINKS THE NIGHT BEFORE SURGERY AT  1000 PM AND 1 PRESURGERY DRINK THE DAY OF THE PROCEDURE 3 HOURS PRIOR TO SCHEDULED SURGERY. NO SOLIDS AFTER MIDNIGHT THE DAY PRIOR TO THE SURGERY. NOTHING BY MOUTH EXCEPT CLEAR LIQUIDS UNTIL THREE HOURS PRIOR TO SCHEDULED SURGERY. PLEASE FINISH PRESURGERY ENSURE DRINK PER SURGEON ORDER 3 HOURS PRIOR TO SCHEDULED SURGERY TIME WHICH NEEDS TO BE COMPLETED AT   0430 am    CLEAR LIQUID DIET   Foods Allowed                                                                     Foods Excluded  Coffee and tea, regular and decaf                             liquids that you cannot  Plain Jell-O in any flavor                                             see through such as: Fruit ices (not with fruit pulp)                                     milk, soups, orange juice  Iced Popsicles                                    All solid food Carbonated beverages, regular and diet                                    Cranberry, grape and apple juices Sports drinks like Gatorade Lightly seasoned clear broth or consume(fat free) Sugar, honey syrup  Sample Menu Breakfast                                Lunch  Supper Cranberry juice                    Beef broth                            Chicken broth Jell-O                                     Grape juice                           Apple  juice Coffee or tea                        Jell-O                                      Popsicle                                                Coffee or tea                        Coffee or tea  _____________________________________________________________________                  BRUSH YOUR TEETH MORNING OF SURGERY AND RINSE YOUR MOUTH OUT, NO CHEWING GUM CANDY OR MINTS.     Take these medicines the morning of surgery with A SIP OF WATER: Omeprazole (Prilosec), Sertraline (Zoloft), Memantine HCL-Donepezil-HCL (Namzaric)                                You may not have any metal on your body including hair pins and              piercings  Do not wear jewelry, make-up, lotions, powders or perfumes, deodorant             Do not wear nail polish.  Do not shave  48 hours prior to surgery.               Do not bring valuables to the hospital. [facility] IS NOT             RESPONSIBLE   FOR VALUABLES.  Contacts, dentures or bridgework may not be worn into surgery.  Leave suitcase in the car. After surgery it may be brought to your room.                 Please read over the following fact sheets you were given: _____________________________________________________________________             [facility] - Preparing for Surgery Before surgery, you can play an important role.  Because skin is not sterile, your skin needs to be as free of germs as possible.  You can reduce the number of germs on your skin by washing with CHG (chlorahexidine gluconate) soap before surgery.  CHG is an antiseptic cleaner which kills germs and bonds with the skin to continue killing germs even after washing. Please DO NOT use  if you have an allergy to CHG or antibacterial soaps.  If your skin becomes reddened/irritated stop using the CHG and inform your nurse when you arrive at Short Stay. Do not shave (including legs and underarms) for at least 48 hours prior to the first CHG shower.  You may shave your  face/neck. Please follow these instructions carefully:  1.  Shower with CHG Soap the night before surgery and the  morning of Surgery.  2.  If you choose to wash your hair, wash your hair first as usual with your  normal  shampoo.  3.  After you shampoo, rinse your hair and body thoroughly to remove the  shampoo.                           4.  Use CHG as you would any other liquid soap.  You can apply chg directly  to the skin and wash                       Gently with a scrungie or clean washcloth.  5.  Apply the CHG Soap to your body ONLY FROM THE NECK DOWN.   Do not use on face/ open                           Wound or open sores. Avoid contact with eyes, ears mouth and genitals (private parts).                       Wash face,  Genitals (private parts) with your normal soap.             6.  Wash thoroughly, paying special attention to the area where your surgery  will be performed.  7.  Thoroughly rinse your body with warm water from the neck down.  8.  DO NOT shower/wash with your normal soap after using and rinsing off  the CHG Soap.                9.  Pat yourself dry with a clean towel.            10.  Wear clean pajamas.            11.  Place clean sheets on your bed the night of your first shower and do not  sleep with pets. Day of Surgery : Do not apply any lotions/deodorants the morning of surgery.  Please wear clean clothes to the hospital/surgery center.  FAILURE TO FOLLOW THESE INSTRUCTIONS MAY RESULT IN THE CANCELLATION OF YOUR SURGERY PATIENT SIGNATURE_________________________________  NURSE SIGNATURE__________________________________  ________________________________________________________________________   Rogelia MireIncentive Spirometer  An incentive spirometer is a tool that can help keep your lungs clear and active. This tool measures how well you are filling your lungs with each breath. Taking long deep breaths may help reverse or decrease the chance of developing breathing  (pulmonary) problems (especially infection) following:  A long period of time when you are unable to move or be active. BEFORE THE PROCEDURE   If the spirometer includes an indicator to show your best effort, your nurse or respiratory therapist will set it to a desired goal.  If possible, sit up straight or lean slightly forward. Try not to slouch.  Hold the incentive spirometer in an upright position. INSTRUCTIONS FOR USE  1. Sit on the edge of your bed if possible, or  sit up as far as you can in bed or on a chair. 2. Hold the incentive spirometer in an upright position. 3. Breathe out normally. 4. Place the mouthpiece in your mouth and seal your lips tightly around it. 5. Breathe in slowly and as deeply as possible, raising the piston or the ball toward the top of the column. 6. Hold your breath for 3-5 seconds or for as long as possible. Allow the piston or ball to fall to the bottom of the column. 7. Remove the mouthpiece from your mouth and breathe out normally. 8. Rest for a few seconds and repeat Steps 1 through 7 at least 10 times every 1-2 hours when you are awake. Take your time and take a few normal breaths between deep breaths. 9. The spirometer may include an indicator to show your best effort. Use the indicator as a goal to work toward during each repetition. 10. After each set of 10 deep breaths, practice coughing to be sure your lungs are clear. If you have an incision (the cut made at the time of surgery), support your incision when coughing by placing a pillow or rolled up towels firmly against it. Once you are able to get out of bed, walk around indoors and cough well. You may stop using the incentive spirometer when instructed by your caregiver.  RISKS AND COMPLICATIONS  Take your time so you do not get dizzy or light-headed.  If you are in pain, you may need to take or ask for pain medication before doing incentive spirometry. It is harder to take a deep breath if you  are having pain. AFTER USE  Rest and breathe slowly and easily.  It can be helpful to keep track of a log of your progress. Your caregiver can provide you with a simple table to help with this. If you are using the spirometer at home, follow these instructions: SEEK MEDICAL CARE IF:   You are having difficultly using the spirometer.  You have trouble using the spirometer as often as instructed.  Your pain medication is not giving enough relief while using the spirometer.  You develop fever of 100.5 F (38.1 C) or higher. SEEK IMMEDIATE MEDICAL CARE IF:   You cough up bloody sputum that had not been present before.  You develop fever of 102 F (38.9 C) or greater.  You develop worsening pain at or near the incision site. MAKE SURE YOU:   Understand these instructions.  Will watch your condition.  Will get help right away if you are not doing well or get worse. Document Released: 09/22/2006 Document Revised: 08/04/2011 Document Reviewed: 11/23/2006 [facility] Patient Information 2014 [place], [place].   ________________________________________________________________________

## 2018-04-19 ENCOUNTER — Encounter (HOSPITAL_COMMUNITY): Payer: Self-pay | Admitting: *Deleted

## 2018-04-19 ENCOUNTER — Other Ambulatory Visit: Payer: Self-pay

## 2018-04-19 ENCOUNTER — Encounter (HOSPITAL_COMMUNITY)
Admission: RE | Admit: 2018-04-19 | Discharge: 2018-04-19 | Disposition: A | Discharge status: Home / Self Care | Payer: Medicare Other | Source: Ambulatory Visit | Attending: General Surgery | Admitting: General Surgery

## 2018-04-19 DIAGNOSIS — I4510 Unspecified right bundle-branch block: Secondary | ICD-10-CM | POA: Diagnosis not present

## 2018-04-19 DIAGNOSIS — Z01818 Encounter for other preprocedural examination: Secondary | ICD-10-CM | POA: Insufficient documentation

## 2018-04-19 DIAGNOSIS — R159 Full incontinence of feces: Secondary | ICD-10-CM | POA: Diagnosis not present

## 2018-04-19 DIAGNOSIS — I10 Essential (primary) hypertension: Secondary | ICD-10-CM | POA: Diagnosis not present

## 2018-04-19 DIAGNOSIS — K623 Rectal prolapse: Secondary | ICD-10-CM | POA: Diagnosis not present

## 2018-04-19 HISTORY — DX: Family history of other specified conditions: Z84.89

## 2018-04-19 HISTORY — DX: Unspecified osteoarthritis, unspecified site: M19.90

## 2018-04-19 HISTORY — DX: Other complications of anesthesia, initial encounter: T88.59XA

## 2018-04-19 HISTORY — DX: Adverse effect of unspecified anesthetic, initial encounter: T41.45XA

## 2018-04-19 LAB — CBC
HCT: 37.4 % (ref 36.0–46.0)
HEMOGLOBIN: 11.7 g/dL — AB (ref 12.0–15.0)
MCH: 32.1 pg (ref 26.0–34.0)
MCHC: 31.3 g/dL (ref 30.0–36.0)
MCV: 102.7 fL — AB (ref 80.0–100.0)
NRBC: 0 % (ref 0.0–0.2)
PLATELETS: 170 10*3/uL (ref 150–400)
RBC: 3.64 MIL/uL — AB (ref 3.87–5.11)
RDW: 13.8 % (ref 11.5–15.5)
WBC: 6.1 10*3/uL (ref 4.0–10.5)

## 2018-04-19 LAB — BASIC METABOLIC PANEL
ANION GAP: 6 (ref 5–15)
BUN: 21 mg/dL (ref 8–23)
CHLORIDE: 109 mmol/L (ref 98–111)
CO2: 30 mmol/L (ref 22–32)
Calcium: 9.3 mg/dL (ref 8.9–10.3)
Creatinine, Ser: 0.9 mg/dL (ref 0.44–1.00)
GFR calc Af Amer: >60 mL/min (ref >60)
GFR, EST NON AFRICAN AMERICAN: 55 mL/min — AB (ref >60)
Glucose, Bld: 109 mg/dL — ABNORMAL HIGH (ref 70–99)
POTASSIUM: 3.4 mmol/L — AB (ref 3.5–5.1)
SODIUM: 145 mmol/L (ref 135–145)

## 2018-04-19 NOTE — Consult Note (Addendum)
WOC Nurse requested for preoperative stoma site marking  Discussed surgical procedure and stoma creation with patient and family.  Son is present and asked appropriate questions, but patient did not appear to understand the procedure or upcoming surgery. Explained role of the WOC nurse team.  Provided the son with educational booklet and provided samples of pouching options; he states she will have total assistance after discharge at a long term care facility.  Examined patient sitting and standing in order to place the marking in the patient's visual field, away from any creases or abdominal contour issues and within the rectus muscle.  Attempted to mark below the patient's belt line, but this was not possible since a significant crease appears when she leans forward, and another is located higher on the abd above the marks.  Marked for colostomy in the LLQ  __7__ cm to the left of the umbilicus and __even with the umbilicus.  Marked for ileostomy in the RLQ  __7__cm to the right of the umbilicus and  __even with the umbilicus.  Patient's abdomen cleansed with CHG wipes at site markings, allowed to air dry prior to marking. Covered marks with thin film transparent dressings to preserve them until date of surgery.   WOC Nurse team will follow up with patient after surgery for continued ostomy care and teaching.  Cammie Mcgeeawn Marylee Belzer MSN, RN, CWOCN, MonroeWCN-AP, CNS [phone]

## 2018-04-19 NOTE — Progress Notes (Signed)
03/03/2018- EKG on chart from Dr. Lorenda Ishiharaupashree Varadarajan

## 2018-04-19 NOTE — Progress Notes (Signed)
Consulted Dr. Jaynie Collinsarolyn Witman, MDA face to face about EKG from 03/03/2018 and EKG from 04/19/2018. Per Dr. Jaynie Collinsarolyn Witman, MDA , EKG's are OK for surgery.

## 2018-04-29 MED ORDER — BUPIVACAINE LIPOSOME 1.3 % IJ SUSP
20.0000 mL | Freq: Once | INTRAMUSCULAR | Status: DC
  Filled 2018-04-29: qty 20

## 2018-04-30 ENCOUNTER — Inpatient Hospital Stay (HOSPITAL_COMMUNITY): Payer: Medicare Other | Admitting: Anesthesiology

## 2018-04-30 ENCOUNTER — Encounter (HOSPITAL_COMMUNITY): Payer: Self-pay | Admitting: Anesthesiology

## 2018-04-30 ENCOUNTER — Inpatient Hospital Stay (HOSPITAL_COMMUNITY)
Admission: RE | Admit: 2018-04-30 | Discharge: 2018-05-04 | DRG: 331 | Disposition: A | Discharge status: Other Acute Inpatient Hospital | Payer: Medicare Other | Source: Skilled Nursing Facility | Attending: General Surgery | Admitting: General Surgery

## 2018-04-30 ENCOUNTER — Encounter (HOSPITAL_COMMUNITY)
Admission: RE | Disposition: A | Discharge status: Nursing Home (Includes ICF) | Payer: Self-pay | Source: Skilled Nursing Facility | Attending: General Surgery

## 2018-04-30 ENCOUNTER — Other Ambulatory Visit: Payer: Self-pay

## 2018-04-30 DIAGNOSIS — R159 Full incontinence of feces: Secondary | ICD-10-CM | POA: Diagnosis present

## 2018-04-30 DIAGNOSIS — G309 Alzheimer's disease, unspecified: Secondary | ICD-10-CM | POA: Diagnosis present

## 2018-04-30 DIAGNOSIS — I10 Essential (primary) hypertension: Secondary | ICD-10-CM | POA: Diagnosis present

## 2018-04-30 DIAGNOSIS — E7800 Pure hypercholesterolemia, unspecified: Secondary | ICD-10-CM | POA: Diagnosis present

## 2018-04-30 DIAGNOSIS — F419 Anxiety disorder, unspecified: Secondary | ICD-10-CM | POA: Diagnosis present

## 2018-04-30 DIAGNOSIS — F0280 Dementia in other diseases classified elsewhere without behavioral disturbance: Secondary | ICD-10-CM | POA: Diagnosis present

## 2018-04-30 DIAGNOSIS — K623 Rectal prolapse: Secondary | ICD-10-CM | POA: Diagnosis present

## 2018-04-30 HISTORY — PX: XI ROBOT ASSISTED RECTOPEXY: SHX6788

## 2018-04-30 LAB — TYPE AND SCREEN
ABO/RH(D): O POS
Antibody Screen: NEGATIVE

## 2018-04-30 LAB — ABO/RH: ABO/RH(D): O POS

## 2018-04-30 SURGERY — RECTOPEXY, ROBOT-ASSISTED
Anesthesia: General

## 2018-04-30 MED ORDER — EPHEDRINE SULFATE-NACL 50-0.9 MG/10ML-% IV SOSY
PREFILLED_SYRINGE | INTRAVENOUS | Status: DC | PRN
  Administered 2018-04-30 (×2): 10 mg via INTRAVENOUS
  Administered 2018-04-30 (×2): 5 mg via INTRAVENOUS

## 2018-04-30 MED ORDER — ORAL CARE MOUTH RINSE
15.0000 mL | Freq: Two times a day (BID) | OROMUCOSAL | Status: DC
  Administered 2018-04-30 – 2018-05-03 (×4): 15 mL via OROMUCOSAL

## 2018-04-30 MED ORDER — KETOROLAC TROMETHAMINE 30 MG/ML IJ SOLN: 15.0000 mg | Freq: Once | INTRAMUSCULAR | Status: DC | PRN

## 2018-04-30 MED ORDER — ALVIMOPAN 12 MG PO CAPS
12.0000 mg | ORAL_CAPSULE | Freq: Two times a day (BID) | ORAL | Status: DC
  Administered 2018-05-01 (×2): 12 mg via ORAL
  Filled 2018-04-30 (×2): qty 1

## 2018-04-30 MED ORDER — PROPOFOL 10 MG/ML IV BOLUS
INTRAVENOUS | Status: AC
  Filled 2018-04-30: qty 40

## 2018-04-30 MED ORDER — ESMOLOL HCL 100 MG/10ML IV SOLN
INTRAVENOUS | Status: DC | PRN
  Administered 2018-04-30: 10 mg via INTRAVENOUS
  Administered 2018-04-30: 5 mg via INTRAVENOUS
  Administered 2018-04-30: 10 mg via INTRAVENOUS
  Administered 2018-04-30: 5 mg via INTRAVENOUS

## 2018-04-30 MED ORDER — SUVOREXANT 10 MG PO TABS: 10.0000 mg | ORAL_TABLET | Freq: Every day | ORAL | Status: DC

## 2018-04-30 MED ORDER — SODIUM CHLORIDE 0.9 % IV SOLN
2.0000 g | INTRAVENOUS | Status: AC
  Administered 2018-04-30: 2 g via INTRAVENOUS

## 2018-04-30 MED ORDER — MEMANTINE HCL-DONEPEZIL HCL ER 28-10 MG PO CP24: 1.0000 | ORAL_CAPSULE | Freq: Every day | ORAL | Status: DC

## 2018-04-30 MED ORDER — ROCURONIUM BROMIDE 10 MG/ML (PF) SYRINGE
PREFILLED_SYRINGE | INTRAVENOUS | Status: DC | PRN
  Administered 2018-04-30: 20 mg via INTRAVENOUS
  Administered 2018-04-30: 50 mg via INTRAVENOUS
  Administered 2018-04-30: 10 mg via INTRAVENOUS
  Administered 2018-04-30: 20 mg via INTRAVENOUS

## 2018-04-30 MED ORDER — TRAMADOL HCL 50 MG PO TABS
50.0000 mg | ORAL_TABLET | Freq: Four times a day (QID) | ORAL | Status: DC | PRN
  Administered 2018-05-04: 50 mg via ORAL
  Filled 2018-04-30: qty 1

## 2018-04-30 MED ORDER — ALUM & MAG HYDROXIDE-SIMETH 200-200-20 MG/5ML PO SUSP: 30.0000 mL | Freq: Four times a day (QID) | ORAL | Status: DC | PRN

## 2018-04-30 MED ORDER — FENTANYL CITRATE (PF) 100 MCG/2ML IJ SOLN
25.0000 ug | INTRAMUSCULAR | Status: DC | PRN
  Administered 2018-04-30 (×2): 25 ug via INTRAVENOUS

## 2018-04-30 MED ORDER — SODIUM CHLORIDE 0.9 % IV SOLN
INTRAVENOUS | Status: AC
  Filled 2018-04-30: qty 2

## 2018-04-30 MED ORDER — SODIUM CHLORIDE 0.9 % IV SOLN
2.0000 g | Freq: Two times a day (BID) | INTRAVENOUS | Status: AC
  Administered 2018-04-30: 2 g via INTRAVENOUS
  Filled 2018-04-30: qty 2

## 2018-04-30 MED ORDER — LACTATED RINGERS IV SOLN
INTRAVENOUS | Status: DC
  Administered 2018-04-30: 08:00:00 via INTRAVENOUS
  Administered 2018-04-30: 1000 mL via INTRAVENOUS

## 2018-04-30 MED ORDER — POLYETHYL GLYCOL-PROPYL GLYCOL 0.4-0.3 % OP GEL: Freq: Every day | OPHTHALMIC | Status: DC

## 2018-04-30 MED ORDER — HYDROMORPHONE HCL 1 MG/ML IJ SOLN: 0.5000 mg | INTRAMUSCULAR | Status: DC | PRN

## 2018-04-30 MED ORDER — ONDANSETRON HCL 4 MG PO TABS: 4.0000 mg | ORAL_TABLET | Freq: Four times a day (QID) | ORAL | Status: DC | PRN

## 2018-04-30 MED ORDER — SUGAMMADEX SODIUM 200 MG/2ML IV SOLN
INTRAVENOUS | Status: AC
  Filled 2018-04-30: qty 2

## 2018-04-30 MED ORDER — PROPOFOL 10 MG/ML IV BOLUS
INTRAVENOUS | Status: DC | PRN
  Administered 2018-04-30: 90 mg via INTRAVENOUS

## 2018-04-30 MED ORDER — ROCURONIUM BROMIDE 10 MG/ML (PF) SYRINGE
PREFILLED_SYRINGE | INTRAVENOUS | Status: AC
  Filled 2018-04-30: qty 20

## 2018-04-30 MED ORDER — ESMOLOL HCL 100 MG/10ML IV SOLN
INTRAVENOUS | Status: AC
  Filled 2018-04-30: qty 20

## 2018-04-30 MED ORDER — GABAPENTIN 300 MG PO CAPS
300.0000 mg | ORAL_CAPSULE | Freq: Two times a day (BID) | ORAL | Status: DC
  Administered 2018-04-30 – 2018-05-04 (×8): 300 mg via ORAL
  Filled 2018-04-30 (×8): qty 1

## 2018-04-30 MED ORDER — 0.9 % SODIUM CHLORIDE (POUR BTL) OPTIME
TOPICAL | Status: DC | PRN
  Administered 2018-04-30: 2000 mL

## 2018-04-30 MED ORDER — SACCHAROMYCES BOULARDII 250 MG PO CAPS
250.0000 mg | ORAL_CAPSULE | Freq: Two times a day (BID) | ORAL | Status: DC
  Administered 2018-04-30 – 2018-05-04 (×8): 250 mg via ORAL
  Filled 2018-04-30 (×8): qty 1

## 2018-04-30 MED ORDER — FENTANYL CITRATE (PF) 100 MCG/2ML IJ SOLN
INTRAMUSCULAR | Status: AC
  Administered 2018-04-30: 25 ug via INTRAVENOUS
  Filled 2018-04-30: qty 2

## 2018-04-30 MED ORDER — ENSURE SURGERY PO LIQD
237.0000 mL | Freq: Two times a day (BID) | ORAL | Status: DC
  Administered 2018-05-01 – 2018-05-04 (×6): 237 mL via ORAL
  Filled 2018-04-30 (×8): qty 237

## 2018-04-30 MED ORDER — PANTOPRAZOLE SODIUM 40 MG PO TBEC
40.0000 mg | DELAYED_RELEASE_TABLET | Freq: Every day | ORAL | Status: DC
  Administered 2018-04-30 – 2018-05-04 (×5): 40 mg via ORAL
  Filled 2018-04-30 (×5): qty 1

## 2018-04-30 MED ORDER — ALVIMOPAN 12 MG PO CAPS
ORAL_CAPSULE | ORAL | Status: AC
  Filled 2018-04-30: qty 1

## 2018-04-30 MED ORDER — LIDOCAINE 2% (20 MG/ML) 5 ML SYRINGE
INTRAMUSCULAR | Status: DC | PRN
  Administered 2018-04-30: 80 mg via INTRAVENOUS

## 2018-04-30 MED ORDER — LIDOCAINE 2% (20 MG/ML) 5 ML SYRINGE
INTRAMUSCULAR | Status: DC | PRN
  Administered 2018-04-30: 1 mg/kg/h via INTRAVENOUS

## 2018-04-30 MED ORDER — ONDANSETRON HCL 4 MG/2ML IJ SOLN: 4.0000 mg | Freq: Four times a day (QID) | INTRAMUSCULAR | Status: DC | PRN

## 2018-04-30 MED ORDER — HYDROCHLOROTHIAZIDE 25 MG PO TABS
25.0000 mg | ORAL_TABLET | Freq: Every day | ORAL | Status: DC
  Administered 2018-04-30 – 2018-05-04 (×5): 25 mg via ORAL
  Filled 2018-04-30 (×5): qty 1

## 2018-04-30 MED ORDER — DEXAMETHASONE SODIUM PHOSPHATE 10 MG/ML IJ SOLN
INTRAMUSCULAR | Status: AC
  Filled 2018-04-30: qty 2

## 2018-04-30 MED ORDER — DONEPEZIL HCL 10 MG PO TABS
10.0000 mg | ORAL_TABLET | Freq: Every day | ORAL | Status: DC
  Administered 2018-04-30 – 2018-05-03 (×4): 10 mg via ORAL
  Filled 2018-04-30 (×4): qty 1

## 2018-04-30 MED ORDER — DOCUSATE SODIUM 100 MG PO CAPS
100.0000 mg | ORAL_CAPSULE | Freq: Two times a day (BID) | ORAL | Status: DC
  Administered 2018-04-30 – 2018-05-04 (×8): 100 mg via ORAL
  Filled 2018-04-30 (×8): qty 1

## 2018-04-30 MED ORDER — ACETAMINOPHEN 500 MG PO TABS
ORAL_TABLET | ORAL | Status: AC
  Filled 2018-04-30: qty 2

## 2018-04-30 MED ORDER — SUGAMMADEX SODIUM 200 MG/2ML IV SOLN
INTRAVENOUS | Status: DC | PRN
  Administered 2018-04-30: 200 mg via INTRAVENOUS

## 2018-04-30 MED ORDER — LACTATED RINGERS IR SOLN
Status: DC | PRN
  Administered 2018-04-30: 1000 mL

## 2018-04-30 MED ORDER — LIDOCAINE 2% (20 MG/ML) 5 ML SYRINGE
INTRAMUSCULAR | Status: AC
  Filled 2018-04-30: qty 10

## 2018-04-30 MED ORDER — POLYVINYL ALCOHOL 1.4 % OP SOLN
1.0000 [drp] | Freq: Every day | OPHTHALMIC | Status: DC
  Administered 2018-04-30 – 2018-05-03 (×4): 1 [drp] via OPHTHALMIC
  Filled 2018-04-30: qty 15

## 2018-04-30 MED ORDER — SERTRALINE HCL 100 MG PO TABS
100.0000 mg | ORAL_TABLET | Freq: Every day | ORAL | Status: DC
  Administered 2018-04-30 – 2018-05-04 (×5): 100 mg via ORAL
  Filled 2018-04-30 (×5): qty 1

## 2018-04-30 MED ORDER — ENOXAPARIN SODIUM 40 MG/0.4ML ~~LOC~~ SOLN
40.0000 mg | SUBCUTANEOUS | Status: DC
  Administered 2018-05-01 – 2018-05-04 (×4): 40 mg via SUBCUTANEOUS
  Filled 2018-04-30 (×4): qty 0.4

## 2018-04-30 MED ORDER — FENTANYL CITRATE (PF) 100 MCG/2ML IJ SOLN
INTRAMUSCULAR | Status: AC
  Filled 2018-04-30: qty 2

## 2018-04-30 MED ORDER — DEXAMETHASONE SODIUM PHOSPHATE 10 MG/ML IJ SOLN
INTRAMUSCULAR | Status: DC | PRN
  Administered 2018-04-30: 8 mg via INTRAVENOUS

## 2018-04-30 MED ORDER — EPHEDRINE 5 MG/ML INJ
INTRAVENOUS | Status: AC
  Filled 2018-04-30: qty 10

## 2018-04-30 MED ORDER — ALVIMOPAN 12 MG PO CAPS
12.0000 mg | ORAL_CAPSULE | ORAL | Status: AC
  Administered 2018-04-30: 12 mg via ORAL

## 2018-04-30 MED ORDER — BUPIVACAINE-EPINEPHRINE (PF) 0.25% -1:200000 IJ SOLN
INTRAMUSCULAR | Status: DC | PRN
  Administered 2018-04-30: 30 mL

## 2018-04-30 MED ORDER — MEMANTINE HCL ER 28 MG PO CP24
28.0000 mg | ORAL_CAPSULE | Freq: Every day | ORAL | Status: DC
  Administered 2018-04-30 – 2018-05-03 (×4): 28 mg via ORAL
  Filled 2018-04-30 (×4): qty 1

## 2018-04-30 MED ORDER — SUGAMMADEX SODIUM 500 MG/5ML IV SOLN
INTRAVENOUS | Status: AC
  Filled 2018-04-30: qty 5

## 2018-04-30 MED ORDER — ACETAMINOPHEN 500 MG PO TABS
1000.0000 mg | ORAL_TABLET | ORAL | Status: AC
  Administered 2018-04-30: 1000 mg via ORAL

## 2018-04-30 MED ORDER — BUPIVACAINE LIPOSOME 1.3 % IJ SUSP
INTRAMUSCULAR | Status: DC | PRN
  Administered 2018-04-30: 20 mL

## 2018-04-30 MED ORDER — FENTANYL CITRATE (PF) 100 MCG/2ML IJ SOLN
INTRAMUSCULAR | Status: DC | PRN
  Administered 2018-04-30: 50 ug via INTRAVENOUS
  Administered 2018-04-30 (×2): 25 ug via INTRAVENOUS

## 2018-04-30 MED ORDER — ZOLPIDEM TARTRATE 5 MG PO TABS
5.0000 mg | ORAL_TABLET | Freq: Every day | ORAL | Status: DC
  Administered 2018-04-30 – 2018-05-03 (×4): 5 mg via ORAL
  Filled 2018-04-30 (×3): qty 1

## 2018-04-30 MED ORDER — BUPIVACAINE-EPINEPHRINE (PF) 0.25% -1:200000 IJ SOLN
INTRAMUSCULAR | Status: AC
  Filled 2018-04-30: qty 30

## 2018-04-30 MED ORDER — KCL IN DEXTROSE-NACL 20-5-0.45 MEQ/L-%-% IV SOLN
INTRAVENOUS | Status: DC
  Administered 2018-04-30 – 2018-05-03 (×4): via INTRAVENOUS
  Filled 2018-04-30 (×4): qty 1000

## 2018-04-30 MED ORDER — ONDANSETRON HCL 4 MG/2ML IJ SOLN
INTRAMUSCULAR | Status: DC | PRN
  Administered 2018-04-30: 4 mg via INTRAVENOUS

## 2018-04-30 MED ORDER — PROMETHAZINE HCL 25 MG/ML IJ SOLN: 6.2500 mg | INTRAMUSCULAR | Status: DC | PRN

## 2018-04-30 MED ORDER — ACETAMINOPHEN 500 MG PO TABS
1000.0000 mg | ORAL_TABLET | Freq: Four times a day (QID) | ORAL | Status: DC
  Administered 2018-04-30 – 2018-05-04 (×12): 1000 mg via ORAL
  Filled 2018-04-30 (×12): qty 2

## 2018-04-30 SURGICAL SUPPLY — 103 items
ADH SKN CLS APL DERMABOND .7 (GAUZE/BANDAGES/DRESSINGS) ×1
BARRIER SKIN 2 1/4 (WOUND CARE) IMPLANT
BARRIER SKIN OD1.75 2 1/4 FLNG (WOUND CARE) IMPLANT
BLADE EXTENDED COATED 6.5IN (ELECTRODE) IMPLANT
BRR SKN FLT 1.75X2.25 2 PC (WOUND CARE)
CANNULA REDUC XI 12-8 STAPL (CANNULA) ×1
CANNULA REDUCER 12-8 DVNC XI (CANNULA) ×1 IMPLANT
CELLS DAT CNTRL 66122 CELL SVR (MISCELLANEOUS) IMPLANT
CLIP VESOLOCK LG 6/CT PURPLE (CLIP) IMPLANT
CLIP VESOLOCK MED 6/CT (CLIP) IMPLANT
COVER SURGICAL LIGHT HANDLE (MISCELLANEOUS) ×3 IMPLANT
COVER TIP SHEARS 8 DVNC (MISCELLANEOUS) ×1 IMPLANT
COVER TIP SHEARS 8MM DA VINCI (MISCELLANEOUS) ×1
COVER WAND RF STERILE (DRAPES) ×1 IMPLANT
DECANTER SPIKE VIAL GLASS SM (MISCELLANEOUS) ×1 IMPLANT
DERMABOND ADVANCED (GAUZE/BANDAGES/DRESSINGS) ×1
DERMABOND ADVANCED .7 DNX12 (GAUZE/BANDAGES/DRESSINGS) IMPLANT
DRAIN CHANNEL 19F RND (DRAIN) ×1 IMPLANT
DRAPE ARM DVNC X/XI (DISPOSABLE) ×4 IMPLANT
DRAPE COLUMN DVNC XI (DISPOSABLE) ×1 IMPLANT
DRAPE DA VINCI XI ARM (DISPOSABLE) ×4
DRAPE DA VINCI XI COLUMN (DISPOSABLE) ×1
DRAPE SURG IRRIG POUCH 19X23 (DRAPES) ×2 IMPLANT
DRSG OPSITE POSTOP 4X10 (GAUZE/BANDAGES/DRESSINGS) IMPLANT
DRSG OPSITE POSTOP 4X6 (GAUZE/BANDAGES/DRESSINGS) IMPLANT
DRSG OPSITE POSTOP 4X8 (GAUZE/BANDAGES/DRESSINGS) IMPLANT
ELECT PENCIL ROCKER SW 15FT (MISCELLANEOUS) ×2 IMPLANT
ELECT REM PT RETURN 15FT ADLT (MISCELLANEOUS) ×2 IMPLANT
ENDOLOOP SUT PDS II  0 18 (SUTURE)
ENDOLOOP SUT PDS II 0 18 (SUTURE) IMPLANT
EVACUATOR SILICONE 100CC (DRAIN) ×1 IMPLANT
GAUZE SPONGE 4X4 12PLY STRL (GAUZE/BANDAGES/DRESSINGS) IMPLANT
GLOVE BIO SURGEON STRL SZ 6.5 (GLOVE) ×6 IMPLANT
GLOVE BIO SURGEON STRL SZ7.5 (GLOVE) ×1 IMPLANT
GLOVE BIOGEL PI IND STRL 7.0 (GLOVE) ×3 IMPLANT
GLOVE BIOGEL PI INDICATOR 7.0 (GLOVE) ×5
GLOVE INDICATOR 8.0 STRL GRN (GLOVE) ×1 IMPLANT
GOWN STRL REUS W/TWL 2XL LVL3 (GOWN DISPOSABLE) ×6 IMPLANT
GOWN STRL REUS W/TWL XL LVL3 (GOWN DISPOSABLE) ×8 IMPLANT
GRASPER ENDOPATH ANVIL 10MM (MISCELLANEOUS) IMPLANT
GRASPER SUT TROCAR 14GX15 (MISCELLANEOUS) IMPLANT
HOLDER FOLEY CATH W/STRAP (MISCELLANEOUS) ×2 IMPLANT
IRRIG SUCT STRYKERFLOW 2 WTIP (MISCELLANEOUS) ×2
IRRIGATION SUCT STRKRFLW 2 WTP (MISCELLANEOUS) ×1 IMPLANT
IRRIGATOR SUCT 8 DISP DVNC XI (IRRIGATION / IRRIGATOR) IMPLANT
IRRIGATOR SUCTION 8MM XI DISP (IRRIGATION / IRRIGATOR)
KIT PROCEDURE DA VINCI SI (MISCELLANEOUS)
KIT PROCEDURE DVNC SI (MISCELLANEOUS) ×1 IMPLANT
LEGGING LITHOTOMY PAIR STRL (DRAPES) ×1 IMPLANT
NDL INSUFFLATION 14GA 120MM (NEEDLE) ×1 IMPLANT
NEEDLE INSUFFLATION 14GA 120MM (NEEDLE) ×2 IMPLANT
PACK CARDIOVASCULAR III (CUSTOM PROCEDURE TRAY) ×2 IMPLANT
PACK COLON (CUSTOM PROCEDURE TRAY) ×2 IMPLANT
PORT LAP GEL ALEXIS MED 5-9CM (MISCELLANEOUS) IMPLANT
POUCH DRAINABLE 1PC 2 1/4 FLAT (OSTOMY) ×1 IMPLANT
RELOAD STAPLE 45 BLU REG DVNC (STAPLE) IMPLANT
RELOAD STAPLE 45 GRN THCK DVNC (STAPLE) IMPLANT
RETRACTOR WND ALEXIS 18 MED (MISCELLANEOUS) IMPLANT
RTRCTR WOUND ALEXIS 18CM MED (MISCELLANEOUS)
SCISSORS LAP 5X35 DISP (ENDOMECHANICALS) ×2 IMPLANT
SEAL CANN UNIV 5-8 DVNC XI (MISCELLANEOUS) ×3 IMPLANT
SEAL XI 5MM-8MM UNIVERSAL (MISCELLANEOUS) ×4
SEALER VESSEL DA VINCI XI (MISCELLANEOUS) ×1
SEALER VESSEL EXT DVNC XI (MISCELLANEOUS) ×1 IMPLANT
SLEEVE ADV FIXATION 5X100MM (TROCAR) IMPLANT
SOLUTION ELECTROLUBE (MISCELLANEOUS) ×2 IMPLANT
SPONGE DRAIN TRACH 4X4 STRL 2S (GAUZE/BANDAGES/DRESSINGS) ×1 IMPLANT
STAPLER 45 BLU RELOAD XI (STAPLE) ×1 IMPLANT
STAPLER 45 BLUE RELOAD XI (STAPLE) ×1
STAPLER 45 GREEN RELOAD XI (STAPLE)
STAPLER 45 GRN RELOAD XI (STAPLE) IMPLANT
STAPLER CANNULA SEAL DVNC XI (STAPLE) ×1 IMPLANT
STAPLER CANNULA SEAL XI (STAPLE) ×1
STAPLER SHEATH (SHEATH) ×1
STAPLER SHEATH ENDOWRIST DVNC (SHEATH) ×1 IMPLANT
STAPLER VISISTAT 35W (STAPLE) IMPLANT
SUT ETHIBOND 0 36 GRN (SUTURE) ×1 IMPLANT
SUT ETHILON 2 0 PS N (SUTURE) ×1 IMPLANT
SUT NOVA NAB DX-16 0-1 5-0 T12 (SUTURE) ×2 IMPLANT
SUT PROLENE 2 0 KS (SUTURE) ×1 IMPLANT
SUT SILK 2 0 (SUTURE) ×2
SUT SILK 2 0 SH CR/8 (SUTURE) ×3 IMPLANT
SUT SILK 2-0 18XBRD TIE 12 (SUTURE) ×1 IMPLANT
SUT SILK 3 0 (SUTURE)
SUT SILK 3 0 SH CR/8 (SUTURE) ×1 IMPLANT
SUT SILK 3-0 18XBRD TIE 12 (SUTURE) ×1 IMPLANT
SUT V-LOC BARB 180 2/0GR6 GS22 (SUTURE) ×4
SUT VIC AB 2-0 SH 18 (SUTURE) ×2 IMPLANT
SUT VIC AB 2-0 SH 27 (SUTURE)
SUT VIC AB 2-0 SH 27X BRD (SUTURE) ×1 IMPLANT
SUT VIC AB 3-0 SH 18 (SUTURE) ×1 IMPLANT
SUT VIC AB 4-0 PS2 18 (SUTURE) ×4 IMPLANT
SUT VIC AB 4-0 PS2 27 (SUTURE) ×4 IMPLANT
SUT VICRYL 0 UR6 27IN ABS (SUTURE) ×1 IMPLANT
SUTURE V-LC BRB 180 2/0GR6GS22 (SUTURE) IMPLANT
SYR 10ML ECCENTRIC (SYRINGE) ×2 IMPLANT
SYS LAPSCP GELPORT 120MM (MISCELLANEOUS)
SYSTEM LAPSCP GELPORT 120MM (MISCELLANEOUS) IMPLANT
TOWEL OR 17X26 10 PK STRL BLUE (TOWEL DISPOSABLE) IMPLANT
TOWEL OR NON WOVEN STRL DISP B (DISPOSABLE) ×2 IMPLANT
TROCAR ADV FIXATION 5X100MM (TROCAR) ×2 IMPLANT
TUBING CONNECTING 10 (TUBING) ×3 IMPLANT
TUBING INSUFFLATION 10FT LAP (TUBING) ×2 IMPLANT

## 2018-04-30 NOTE — Anesthesia Postprocedure Evaluation (Signed)
Anesthesia Post Note  Patient: Kelly Cross  Procedure(s) Performed: XI ROBOT ASSISTED RECTOPEXY AND END COLOSTOMY ERAS PATHWAY (N/A )     Patient location during evaluation: PACU Anesthesia Type: General Level of consciousness: awake and alert Pain management: pain level controlled Vital Signs Assessment: post-procedure vital signs reviewed and stable Respiratory status: spontaneous breathing, nonlabored ventilation, respiratory function stable and patient connected to nasal cannula oxygen Cardiovascular status: blood pressure returned to baseline and stable Postop Assessment: no apparent nausea or vomiting Anesthetic complications: no    Last Vitals:  Vitals:   04/30/18 1145 04/30/18 1200  BP: 136/79 (!) 152/75  Pulse: 84 85  Resp: 13 18  Temp:    SpO2: 98% 98%    Last Pain:  Vitals:   04/30/18 1200  TempSrc:   PainSc: Asleep                 Pasty Manninen S

## 2018-04-30 NOTE — Addendum Note (Signed)
Addendum  created 04/30/18 1226 by Theodosia QuayKey, Jaelynn Pozo, CRNA   Intraprocedure Flowsheets edited

## 2018-04-30 NOTE — Transfer of Care (Signed)
Immediate Anesthesia Transfer of Care Note  Patient: Kelly Cross DrillingFloydelia F Diosdado  Procedure(s) Performed: Procedure(s): XI ROBOT ASSISTED RECTOPEXY AND END COLOSTOMY ERAS PATHWAY (N/A)  Patient Location: PACU  Anesthesia Type:General  Level of Consciousness:  sedated, patient cooperative and responds to stimulation  Airway & Oxygen Therapy:Patient Spontanous Breathing and Patient connected to face mask oxgen  Post-op Assessment:  Report given to PACU RN and Post -op Vital signs reviewed and stable  Post vital signs:  Reviewed and stable  Last Vitals:  Vitals:   04/30/18 0606  BP: (!) 156/69  Pulse: 76  Resp: 16  Temp: (!) 36.4 C  SpO2: 96%    Complications: No apparent anesthesia complications

## 2018-04-30 NOTE — Op Note (Signed)
04/30/2018  10:10 AM  PATIENT:  Kelly Cross  84 y.o. female  Patient Care Team: Lorenda Ishihara, MD as PCP - General (Internal Medicine)  PRE-OPERATIVE DIAGNOSIS:  Rectal prolapse  POST-OPERATIVE DIAGNOSIS:  Rectal prolapse  PROCEDURE:   XI ROBOT ASSISTED RECTOPEXY AND END COLOSTOMY   Surgeon(s): Romie Levee, MD Andria Meuse, MD  ASSISTANT: Dr Cliffton Asters   ANESTHESIA:   local and general  EBL: 50ml Total I/O In: 1000 [I.V.:1000] Out: 300 [Urine:300]  Delay start of Pharmacological VTE agent (>24hrs) due to surgical blood loss or risk of bleeding:  no  DRAINS: (52F) Jackson-Pratt drain(s) with closed bulb suction in the pelvis   SPECIMEN:  Source of Specimen:  Sigmoid colon  DISPOSITION OF SPECIMEN:  PATHOLOGY  COUNTS:  YES  PLAN OF CARE: Admit to inpatient   PATIENT DISPOSITION:  PACU - hemodynamically stable.  INDICATION:    84 y.o. F with pelvic floor laxity and   I recommended segmental resection:  The anatomy & physiology of the digestive tract was discussed.  The pathophysiology was discussed.  Natural history risks without surgery was discussed.   I worked to give an overview of the disease and the frequent need to have multispecialty involvement.  I feel the risks of no intervention will lead to serious problems that outweigh the operative risks; therefore, I recommended a partial colectomy to remove the pathology.  Laparoscopic & open techniques were discussed.   Risks such as bleeding, infection, abscess, leak, reoperation, possible ostomy, hernia, heart attack, death, and other risks were discussed.  I noted a good likelihood this will help address the problem.   Goals of post-operative recovery were discussed as well.    The patient expressed understanding & wished to proceed with surgery.  OR FINDINGS:   Patient had obvious prolapse   DESCRIPTION:   Informed consent was confirmed.  The patient underwent general anaesthesia  without difficulty.  The patient was positioned appropriately.  VTE prevention in place.  The patient's abdomen was clipped, prepped, & draped in a sterile fashion.  Surgical timeout confirmed our plan.  The patient was positioned in reverse Trendelenburg.  Abdominal entry was gained using a Varies needle in the LUQ.  Entry was clean.  I induced carbon dioxide insufflation.  An 8mm robotic port was placed in the RUQ.  Camera inspection revealed no injury.  Extra ports were carefully placed under direct laparoscopic visualization.  I laparoscopically reflected the greater omentum and the upper abdomen the small bowel in the upper abdomen. The patient was appropriately positioned and the robot was docked to the patient's left side.  Instruments were placed under direct visualization.    I mobilized the sigmoid colon off of the pelvic sidewall.  I scored the base of peritoneum of the right side of the mesentery of the left colon from the ligament of Treitz to the peritoneal reflection of the mid rectum.  The patient had significant tissue laxity.  I elevated the sigmoid mesentery and enetered into the retro-mesenteric plane. We were able to identify the left ureter and gonadal vessels. We kept those posterior within the retroperitoneum and elevated the left colon mesentery off that. I did isolated IMA pedicle but did not ligate it yet.  I continued distally and got into the avascular plane posterior to the mesorectum. I took this down posteriorly to the pelvic floor.  I mobilized the peritoneal coverings around the peritoneal reflection on both the right and left sides of the rectum.  I could see the right and left ureters and stayed away from them.  I left the right rectal stalk intact as well at the anterior vaginal attachments.  Once the rectum was mobilized enough to get the peritoneal reflection to the sacral prominence, I skeletonized the mesorectum at the junction at the proximal rectum using blunt  dissection & bipolar robotic vessel sealer.  I mobilized the sigmoid colon in a lateral to medial fashion off the line of Toldt up to the pelvic inlet.  I divided the sigmoid vessels with the vessel sealer.  This allowed mobilization to the previously marked ostomy site.  I then robotically sutured the rectal stump to the sacral prominence using a 2-0 V lock to hold it in place and 3, 0 Vicryl sutures to form the rectopexy.  I then placed a 19 F blake drain in the pelvis and brought this out through the RLQ port site.  It was sutured into place with a 2-0 Nylon suture.  The robot was undocked and the LLQ port was turned into an ostomy site.  The colon was pulled through.  The ports were removed and closed with 4-0 Vicryl and dermabond.  The ostomy was then matured in standard Brooke fashion using 2-0 Vicryl sutures.    An MD assistant was necessary for tissue manipulation, retraction and positioning due to the complexity of the case and hospital policies

## 2018-04-30 NOTE — Anesthesia Procedure Notes (Signed)
Procedure Name: Intubation Date/Time: 04/30/2018 7:34 AM Performed by: Lavina Hamman, CRNA Pre-anesthesia Checklist: Patient identified, Emergency Drugs available, Suction available, Patient being monitored and Timeout performed Patient Re-evaluated:Patient Re-evaluated prior to induction Oxygen Delivery Method: Circle system utilized Preoxygenation: Pre-oxygenation with 100% oxygen Induction Type: IV induction Ventilation: Mask ventilation without difficulty Laryngoscope Size: Mac and 3 Grade View: Grade I Tube type: Oral Tube size: 7.0 mm Number of attempts: 1 Airway Equipment and Method: Stylet Placement Confirmation: ETT inserted through vocal cords under direct vision,  positive ETCO2,  CO2 detector and breath sounds checked- equal and bilateral Secured at: 21 cm Tube secured with: Tape Dental Injury: Teeth and Oropharynx as per pre-operative assessment  Comments: ATOI

## 2018-04-30 NOTE — Anesthesia Preprocedure Evaluation (Signed)
Anesthesia Evaluation  Patient identified by MRN, date of birth, ID band Patient awake    Reviewed: Allergy & Precautions, NPO status , Patient's Chart, lab work & pertinent test results  Airway Mallampati: II  TM Distance: >3 FB Neck ROM: Full    Dental no notable dental hx.    Pulmonary neg pulmonary ROS,  sarcoidosis   Pulmonary exam normal breath sounds clear to auscultation       Cardiovascular hypertension, Normal cardiovascular exam Rhythm:Regular Rate:Normal     Neuro/Psych negative neurological ROS  negative psych ROS   GI/Hepatic negative GI ROS, Neg liver ROS,   Endo/Other  negative endocrine ROS  Renal/GU negative Renal ROS  negative genitourinary   Musculoskeletal negative musculoskeletal ROS (+)   Abdominal   Peds negative pediatric ROS (+)  Hematology  (+) anemia ,   Anesthesia Other Findings   Reproductive/Obstetrics negative OB ROS                             Anesthesia Physical Anesthesia Plan  ASA: III  Anesthesia Plan: General   Post-op Pain Management:    Induction: Intravenous  PONV Risk Score and Plan: 3 and Ondansetron, Dexamethasone and Treatment may vary due to age or medical condition  Airway Management Planned: Oral ETT  Additional Equipment:   Intra-op Plan:   Post-operative Plan: Extubation in OR  Informed Consent: I have reviewed the patients History and Physical, chart, labs and discussed the procedure including the risks, benefits and alternatives for the proposed anesthesia with the patient or authorized representative who has indicated his/her understanding and acceptance.   Dental advisory given  Plan Discussed with: CRNA and Surgeon  Anesthesia Plan Comments:         Anesthesia Quick Evaluation

## 2018-04-30 NOTE — H&P (Signed)
The patient is a 84 year old female who presents with rectal prolapse. 84 year old female with Alzheimer's disease who lives in a nursing facility presents to the office with complaints of rectal prolapse. She has been diagnosed with a bladder prolapse previously by Dr. McDiarmidt. They have apparently discussed a minimally invasive procedure versus watchful waiting. Since that time she has had multiple episodes of rectal prolapse. It does appear that the patient has some constipation at baseline. Her last colonoscopy was in 2011 and this showed a few small polyps, which were resected. Since I last saw her, she has developed more prolapse and more fecal incontinence.   Problem List/Past Medical Romie Levee, MD; 03/02/2018 10:42 AM) COMPLETE RECTAL PROLAPSE WITH DISPLACEMENT OF ANAL SPHINCTER (K62.3)  Past Surgical History Romie Levee, MD; 03/02/2018 10:42 AM) Cataract Surgery Bilateral. Colon Polyp Removal - Colonoscopy Oral Surgery Sentinel Lymph Node Biopsy Spinal Surgery - Lower Back  Diagnostic Studies History Romie Levee, MD; 03/02/2018 10:42 AM) Colonoscopy 1-5 years ago Mammogram >3 years ago Pap Smear >5 years ago  Allergies (Tanisha A. Manson Passey, RMA; 03/02/2018 10:29 AM) No Known Drug Allergies [11/17/2017]: Allergies Reconciled  Medication History (Tanisha A. Manson Passey, RMA; 03/02/2018 10:29 AM) Sertraline HCl (100MG  Tablet, Oral) Active. Belsomra (10MG  Tablet, Oral) Active. Alendronate Sodium (70MG  Tablet, Oral) Active. HydroCHLOROthiazide (25MG  Tablet, Oral) Active. Namzaric (28-10MG  Capsule ER 24HR, Oral) Active. Potassium Chloride ( Packet, Oral) Active. Medications Reconciled  Social History Romie Levee, MD; 03/02/2018 10:42 AM) Caffeine use Carbonated beverages, Coffee. No alcohol use No drug use Tobacco use Never smoker.  Family History Romie Levee, MD; 03/02/2018 10:42 AM) Cancer Father, Sister. Cerebrovascular  Accident Son. Diabetes Mellitus Son. Hypertension Son. Kidney Disease Son.  Pregnancy / Birth History Romie Levee, MD; 03/02/2018 10:42 AM) Rhett Bannister 3 Maternal age 58-25 Para 3 Regular periods  Other Problems Romie Levee, MD; 03/02/2018 10:42 AM) Anxiety Disorder Arthritis Bladder Problems Hypercholesterolemia Other disease, cancer, significant illness     Review of Systems  General Not Present- Appetite Loss, Chills, Fatigue, Fever, Night Sweats, Weight Gain and Weight Loss. Skin Not Present- Change in Wart/Mole, Dryness, Hives, Jaundice, New Lesions, Non-Healing Wounds, Rash and Ulcer. HEENT Present- Hearing Loss and Wears glasses/contact lenses. Not Present- Earache, Hoarseness, Nose Bleed, Oral Ulcers, Ringing in the Ears, Seasonal Allergies, Sinus Pain, Sore Throat, Visual Disturbances and Yellow Eyes. Gastrointestinal Present- Change in Bowel Habits and Constipation. Not Present- Abdominal Pain, Bloating, Bloody Stool, Chronic diarrhea, Difficulty Swallowing, Excessive gas, Gets full quickly at meals, Hemorrhoids, Indigestion, Nausea, Rectal Pain and Vomiting. Female Genitourinary Present- Frequency. Not Present- Nocturia, Painful Urination, Pelvic Pain and Urgency. Neurological Present- Decreased Memory. Not Present- Fainting, Headaches, Numbness, Seizures, Tingling, Tremor, Trouble walking and Weakness. Psychiatric Present- Anxiety. Not Present- Bipolar, Change in Sleep Pattern, Depression, Fearful and Frequent crying. Endocrine Present- Cold Intolerance. Not Present- Excessive Hunger, Hair Changes, Heat Intolerance, Hot flashes and New Diabetes.  BP (!) 156/69   Pulse 76   Temp (!) 97.5 F (36.4 C) (Oral)   Resp 16   Ht 5\' 3"  (1.6 m)   Wt 50.3 kg   SpO2 96%   BMI 19.66 kg/m     Physical Exam   General Mental Status-Alert. General Appearance-Not in acute distress. Build & Nutrition-Well nourished. Posture-Normal  posture. Gait-Normal.  Head and Neck Head-normocephalic, atraumatic with no lesions or palpable masses. Trachea-midline.  Chest and Lung Exam Chest and lung exam reveals -on auscultation, normal breath sounds, no adventitious sounds and normal vocal resonance.  Cardiovascular Cardiovascular examination  reveals -normal heart sounds, regular rate and rhythm with no murmurs and no digital clubbing, cyanosis, edema, increased warmth or tenderness.  Abdomen Inspection Inspection of the abdomen reveals - No Hernias. Palpation/Percussion Palpation and Percussion of the abdomen reveal - Soft, Non Tender, No Rigidity (guarding), No hepatosplenomegaly and No Palpable abdominal masses.  Rectal Anorectal Exam External - Note: Full-thickness rectal prolapse, easily reducible.  Neurologic Neurologic evaluation reveals -alert and oriented x 3 with no impairment of recent or remote memory, normal attention span and ability to concentrate, normal sensation and normal coordination.  Musculoskeletal Normal Exam - Bilateral-Upper Extremity Strength Normal and Lower Extremity Strength Normal.    Assessment & Plan  COMPLETE RECTAL PROLAPSE WITH DISPLACEMENT OF ANAL SPHINCTER (K62.3) Impression: Patient continues to have difficulty with fecal incontinence and prolapse. It was recommended that she proceed with surgery. We have once again discussed her options. We discussed that she could benefit from a perineal repair and all marked procedure but this would need to be done at a [facility] setting. We discussed that she will most likely be incontinent after this procedure as well but he has declined any further evaluation for this. I think her best option would be a rectopexy with end colostomy. They seem to be more amenable to this now. We will go ahead and get medical clearance from her doctors for surgery and then proceed with a robotic-assisted rectopexy and end colostomy. The  surgery and anatomy were described to the patient as well as the risks of surgery and the possible complications. These include: Bleeding, deep abdominal infections and possible wound complications such as hernia and infection, damage to adjacent structures, leak of surgical connections, which can lead to other surgeries and possibly an ostomy, possible need for other procedures, such as abscess drains in radiology, possible prolonged hospital stay, possible diarrhea from removal of part of the colon, possible constipation from narcotics, possible bowel, bladder or sexual dysfunction if having rectal surgery, prolonged fatigue/weakness or appetite loss, possible early recurrence of of disease, possible complications of their medical problems such as heart disease or arrhythmias or lung problems, death (less than 1%). I believe the patient understands and wishes to proceed with the surgery.

## 2018-04-30 NOTE — Consult Note (Signed)
I attempted to mark patient this morning in Short Stay for ostomy stoma placement, however, was unsuccessful as the patient was already in the OR.   Helmut MusterSherry Shaena Parkerson, RN, MSN, CWOCN, CNS-BC, pager [phone]

## 2018-05-01 ENCOUNTER — Encounter (HOSPITAL_COMMUNITY): Payer: Self-pay | Admitting: General Surgery

## 2018-05-01 LAB — CBC
HCT: 33 % — ABNORMAL LOW (ref 36.0–46.0)
Hemoglobin: 10.4 g/dL — ABNORMAL LOW (ref 12.0–15.0)
MCH: 32.2 pg (ref 26.0–34.0)
MCHC: 31.5 g/dL (ref 30.0–36.0)
MCV: 102.2 fL — ABNORMAL HIGH (ref 80.0–100.0)
Platelets: 143 10*3/uL — ABNORMAL LOW (ref 150–400)
RBC: 3.23 MIL/uL — AB (ref 3.87–5.11)
RDW: 14.2 % (ref 11.5–15.5)
WBC: 9.3 10*3/uL (ref 4.0–10.5)
nRBC: 0 % (ref 0.0–0.2)

## 2018-05-01 LAB — BASIC METABOLIC PANEL
Anion gap: 6 (ref 5–15)
BUN: 12 mg/dL (ref 8–23)
CO2: 29 mmol/L (ref 22–32)
Calcium: 8.2 mg/dL — ABNORMAL LOW (ref 8.9–10.3)
Chloride: 104 mmol/L (ref 98–111)
Creatinine, Ser: 1 mg/dL (ref 0.44–1.00)
GFR calc Af Amer: 58 mL/min — ABNORMAL LOW (ref >60)
GFR calc non Af Amer: 50 mL/min — ABNORMAL LOW (ref >60)
Glucose, Bld: 105 mg/dL — ABNORMAL HIGH (ref 70–99)
POTASSIUM: 3.1 mmol/L — AB (ref 3.5–5.1)
Sodium: 139 mmol/L (ref 135–145)

## 2018-05-01 NOTE — Progress Notes (Signed)
1 Day Post-Op   Subjective/Chief Complaint: No complaints. Having some output from ostomy   Objective: Vital signs in last 24 hours: Temp:  [98 F (36.7 C)-98.9 F (37.2 C)] 98.3 F (36.8 C) (12/07 0552) Pulse Rate:  [79-94] 79 (12/07 0552) Resp:  [13-20] 14 (12/07 0552) BP: (97-189)/(56-85) 111/59 (12/07 0552) SpO2:  [89 %-100 %] 90 % (12/07 0552) Weight:  [56 kg] 56 kg (12/07 0552)    Intake/Output from previous day: 12/06 0701 - 12/07 0700 In: 2522 [P.O.:240; I.V.:2182; IV Piggyback:100] Out: 2070 [Urine:1650; Drains:320; Stool:50; Blood:50] Intake/Output this shift: Total I/O In: 60 [P.O.:60] Out: -   General appearance: alert and cooperative Resp: clear to auscultation bilaterally Cardio: regular rate and rhythm GI: soft, mild tenderness. ostomy pink and productive  Lab Results:  Recent Labs    05/01/18 0422  WBC 9.3  HGB 10.4*  HCT 33.0*  PLT 143*   BMET Recent Labs    05/01/18 0422  NA 139  K 3.1*  CL 104  CO2 29  GLUCOSE 105*  BUN 12  CREATININE 1.00  CALCIUM 8.2*   PT/INR No results for input(s): LABPROT, INR in the last 72 hours. ABG No results for input(s): PHART, HCO3 in the last 72 hours.  Invalid input(s): PCO2, PO2  Studies/Results: No results found.  Anti-infectives: Anti-infectives (From admission, onward)   Start     Dose/Rate Route Frequency Ordered Stop   04/30/18 2000  cefoTEtan (CEFOTAN) 2 g in sodium chloride 0.9 % 100 mL IVPB     2 g 200 mL/hr over 30 Minutes Intravenous Every 12 hours 04/30/18 1541 04/30/18 1950   04/30/18 0600  cefoTEtan (CEFOTAN) 2 g in sodium chloride 0.9 % 100 mL IVPB     2 g 200 mL/hr over 30 Minutes Intravenous On call to O.R. 04/30/18 [id number] 04/30/18 0732   04/30/18 0557  sodium chloride 0.9 % with cefoTEtan (CEFOTAN) ADS Med    Note to Pharmacy:  Kelly Cross   : cabinet override      04/30/18 0557 04/30/18 0732      Assessment/Plan: s/p Procedure(s): XI ROBOT ASSISTED RECTOPEXY AND  END COLOSTOMY ERAS PATHWAY (N/A) Advance diet  Ambulate Pod 1  LOS: 1 day    Kelly Cross 05/01/2018

## 2018-05-02 LAB — CBC
HEMATOCRIT: 33.2 % — AB (ref 36.0–46.0)
HEMOGLOBIN: 10.5 g/dL — AB (ref 12.0–15.0)
MCH: 32.4 pg (ref 26.0–34.0)
MCHC: 31.6 g/dL (ref 30.0–36.0)
MCV: 102.5 fL — ABNORMAL HIGH (ref 80.0–100.0)
Platelets: 139 10*3/uL — ABNORMAL LOW (ref 150–400)
RBC: 3.24 MIL/uL — ABNORMAL LOW (ref 3.87–5.11)
RDW: 14 % (ref 11.5–15.5)
WBC: 10.4 10*3/uL (ref 4.0–10.5)
nRBC: 0 % (ref 0.0–0.2)

## 2018-05-02 LAB — BASIC METABOLIC PANEL
Anion gap: 6 (ref 5–15)
BUN: 11 mg/dL (ref 8–23)
CHLORIDE: 105 mmol/L (ref 98–111)
CO2: 28 mmol/L (ref 22–32)
Calcium: 8.1 mg/dL — ABNORMAL LOW (ref 8.9–10.3)
Creatinine, Ser: 0.87 mg/dL (ref 0.44–1.00)
GFR calc Af Amer: >60 mL/min (ref >60)
GFR calc non Af Amer: 59 mL/min — ABNORMAL LOW (ref >60)
Glucose, Bld: 103 mg/dL — ABNORMAL HIGH (ref 70–99)
Potassium: 3.2 mmol/L — ABNORMAL LOW (ref 3.5–5.1)
Sodium: 139 mmol/L (ref 135–145)

## 2018-05-02 NOTE — Progress Notes (Signed)
I have reviewed and concur with this student's documentation.   

## 2018-05-02 NOTE — Progress Notes (Signed)
2 Days Post-Op   Subjective/Chief Complaint: No complaints   Objective: Vital signs in last 24 hours: Temp:  [97.5 F (36.4 C)-99.1 F (37.3 C)] 97.5 F (36.4 C) (12/08 0427) Pulse Rate:  [75-98] 98 (12/08 0427) Resp:  [16-18] 16 (12/08 0427) BP: (105-131)/(59-69) 115/69 (12/08 0427) SpO2:  [92 %-96 %] 92 % (12/08 0427) Weight:  [53.8 kg] 53.8 kg (12/08 0427) Last BM Date: 05/01/18  Intake/Output from previous day: 12/07 0701 - 12/08 0700 In: 1675.5 [P.O.:480; I.V.:1195.5] Out: 2255 [Urine:1700; Drains:230; Stool:325] Intake/Output this shift: Total I/O In: 240 [P.O.:240] Out: -   General appearance: alert and cooperative Resp: clear to auscultation bilaterally Cardio: regular rate and rhythm GI: soft, mild tenderness. ostomy pink and productive  Lab Results:  Recent Labs    05/01/18 0422 05/02/18 0419  WBC 9.3 10.4  HGB 10.4* 10.5*  HCT 33.0* 33.2*  PLT 143* 139*   BMET Recent Labs    05/01/18 0422 05/02/18 0419  NA 139 139  K 3.1* 3.2*  CL 104 105  CO2 29 28  GLUCOSE 105* 103*  BUN 12 11  CREATININE 1.00 0.87  CALCIUM 8.2* 8.1*   PT/INR No results for input(s): LABPROT, INR in the last 72 hours. ABG No results for input(s): PHART, HCO3 in the last 72 hours.  Invalid input(s): PCO2, PO2  Studies/Results: No results found.  Anti-infectives: Anti-infectives (From admission, onward)   Start     Dose/Rate Route Frequency Ordered Stop   04/30/18 2000  cefoTEtan (CEFOTAN) 2 g in sodium chloride 0.9 % 100 mL IVPB     2 g 200 mL/hr over 30 Minutes Intravenous Every 12 hours 04/30/18 1541 04/30/18 1950   04/30/18 0600  cefoTEtan (CEFOTAN) 2 g in sodium chloride 0.9 % 100 mL IVPB     2 g 200 mL/hr over 30 Minutes Intravenous On call to O.R. 04/30/18 0552 04/30/18 0732   04/30/18 0557  sodium chloride 0.9 % with cefoTEtan (CEFOTAN) ADS Med    Note to Pharmacy:  Kelly DolphinWhitlow, Kelly Cross   : cabinet override      04/30/18 0557 04/30/18 0732       Assessment/Plan: s/p Procedure(s): XI ROBOT ASSISTED RECTOPEXY AND END COLOSTOMY ERAS PATHWAY (N/A) Advance diet  Will need placement back to nursing home   LOS: 2 days    Kelly Cross 05/02/2018

## 2018-05-03 LAB — CBC
HCT: 30.5 % — ABNORMAL LOW (ref 36.0–46.0)
Hemoglobin: 9.6 g/dL — ABNORMAL LOW (ref 12.0–15.0)
MCH: 32.5 pg (ref 26.0–34.0)
MCHC: 31.5 g/dL (ref 30.0–36.0)
MCV: 103.4 fL — ABNORMAL HIGH (ref 80.0–100.0)
NRBC: 0 % (ref 0.0–0.2)
Platelets: 132 10*3/uL — ABNORMAL LOW (ref 150–400)
RBC: 2.95 MIL/uL — ABNORMAL LOW (ref 3.87–5.11)
RDW: 14.1 % (ref 11.5–15.5)
WBC: 9.5 10*3/uL (ref 4.0–10.5)

## 2018-05-03 LAB — BASIC METABOLIC PANEL
Anion gap: 4 — ABNORMAL LOW (ref 5–15)
BUN: 15 mg/dL (ref 8–23)
CO2: 27 mmol/L (ref 22–32)
Calcium: 8 mg/dL — ABNORMAL LOW (ref 8.9–10.3)
Chloride: 110 mmol/L (ref 98–111)
Creatinine, Ser: 0.91 mg/dL (ref 0.44–1.00)
GFR calc Af Amer: >60 mL/min (ref >60)
GFR calc non Af Amer: 56 mL/min — ABNORMAL LOW (ref >60)
GLUCOSE: 125 mg/dL — AB (ref 70–99)
Potassium: 3.5 mmol/L (ref 3.5–5.1)
Sodium: 141 mmol/L (ref 135–145)

## 2018-05-03 NOTE — Evaluation (Signed)
Physical Therapy Evaluation Patient Details Name: Kelly Cross MRN: [id number] DOB: 08/31/1926 Today's Date: 05/03/2018   History of Present Illness  84 yo female admitted with rectal prolapse. S/P rectopexy, end colostomy 12/6. Hx of Alz.   Clinical Impression  On eval, pt required Min guard-Min assist for mobility. She was able to walk ~135 feet with a RW. Pt is unsteady at times. She follows commands well. She was A&O x 1 during this session. No family present during session. At this time, recommendation is for SNF unless ALF staff can provide current level of mobility assistance and nursing care. Will follow during hospital stay.     Follow Up Recommendations SNF(unless ALF can provide current level of mobility assist/nursing care)    Equipment Recommendations  None recommended by PT    Recommendations for Other Services       Precautions / Restrictions Precautions Precautions: Fall Restrictions Weight Bearing Restrictions: No      Mobility  Bed Mobility Overal bed mobility: Needs Assistance Bed Mobility: Supine to Sit;Sit to Supine     Supine to sit: Min guard;HOB elevated Sit to supine: Min guard;HOB elevated   General bed mobility comments: close guad for safety.  Transfers Overall transfer level: Needs assistance Equipment used: Rolling walker (2 wheeled) Transfers: Sit to/from Stand Sit to Stand: Min assist         General transfer comment: Small amount of assist to steady. VCs safety, hand placement  Ambulation/Gait Ambulation/Gait assistance: Min assist Gait Distance (Feet): 135 Feet Assistive device: Rolling walker (2 wheeled) Gait Pattern/deviations: Step-through pattern;Drifts right/left     General Gait Details: Intermittent assist to steady. Cues for safety. Pt tolerated distance well. No c/o lightheadedness/dizziness.  Stairs            Wheelchair Mobility    Modified Rankin (Stroke Patients Only)       Balance Overall  balance assessment: Needs assistance         Standing balance support: Bilateral upper extremity supported Standing balance-Leahy Scale: Poor                               Pertinent Vitals/Pain Pain Assessment: Faces Faces Pain Scale: Hurts little more Pain Location: abdomen Pain Descriptors / Indicators: Discomfort Pain Intervention(s): Monitored during session;Repositioned    Home Living Family/patient expects to be discharged to:: Unsure     Type of Home: Assisted living         Home Equipment: Walker - 2 wheels      Prior Function Level of Independence: Needs assistance   Gait / Transfers Assistance Needed: RW for ambulation. Pt stated she walks to dining room  ADL's / Homemaking Assistance Needed: likely assist for showers. Pt stated she can dress herself        Hand Dominance        Extremity/Trunk Assessment   Upper Extremity Assessment Upper Extremity Assessment: Overall WFL for tasks assessed    Lower Extremity Assessment Lower Extremity Assessment: Generalized weakness    Cervical / Trunk Assessment Cervical / Trunk Assessment: Kyphotic  Communication   Communication: No difficulties  Cognition Arousal/Alertness: Awake/alert Behavior During Therapy: WFL for tasks assessed/performed Overall Cognitive Status: History of cognitive impairments - at baseline  General Comments      Exercises     Assessment/Plan    PT Assessment Patient needs continued PT services  PT Problem List Decreased strength;Pain;Decreased balance;Decreased mobility;Decreased activity tolerance       PT Treatment Interventions DME instruction;Gait training;Functional mobility training;Therapeutic activities;Balance training;Patient/family education;Therapeutic exercise    PT Goals (Current goals can be found in the Care Plan section)  Acute Rehab PT Goals Patient Stated Goal: none stated PT Goal  Formulation: Patient unable to participate in goal setting Time For Goal Achievement: 05/17/18 Potential to Achieve Goals: Good    Frequency Min 3X/week   Barriers to discharge        Co-evaluation               AM-PAC PT "6 Clicks" Mobility  Outcome Measure Help needed turning from your back to your side while in a flat bed without using bedrails?: A Little Help needed moving from lying on your back to sitting on the side of a flat bed without using bedrails?: A Little Help needed moving to and from a bed to a chair (including a wheelchair)?: A Little Help needed standing up from a chair using your arms (e.g., wheelchair or bedside chair)?: A Little Help needed to walk in hospital room?: A Little Help needed climbing 3-5 steps with a railing? : A Little 6 Click Score: 18    End of Session Equipment Utilized During Treatment: Gait belt Activity Tolerance: Patient tolerated treatment well Patient left: in bed;with call bell/phone within reach;with bed alarm set   PT Visit Diagnosis: Unsteadiness on feet (R26.81);Muscle weakness (generalized) (M62.81)    Time: 4098-1191 PT Time Calculation (min) (ACUTE ONLY): 12 min   Charges:   PT Evaluation $PT Eval Moderate Complexity: 1 Mod           Rebeca Alert, PT Acute Rehabilitation Services Pager: [phone] Office: [phone]

## 2018-05-03 NOTE — Clinical Social Work Note (Signed)
Clinical Social Work Assessment  Patient Details  Name: Kelly Cross MRN: [id number] Date of Birth: 1927/05/12  Date of referral:  05/03/18               Reason for consult:  Discharge Planning                Permission sought to share information with:  Family Supports Permission granted to share information::  Yes, Verbal Permission Granted  Name::     Kelly Cross  Agency::  [facility]  Relationship::  son  Contact Information:  [phone]  Housing/Transportation Living arrangements for the past 2 months:  Assisted Living Facility([facility]) Source of Information:  Patient Patient Interpreter Needed:  None Criminal Activity/Legal Involvement Pertinent to Current Situation/Hospitalization:  No - Comment as needed Significant Relationships:  Adult Children, Community Support Lives with:  Facility Resident Do you feel safe going back to the place where you live?  Yes Need for family participation in patient care:     Care giving concerns:  CSW received verbal consult to assist patient in returning back to her ALF   Social Worker assessment / plan:  CSW spoke with patients son Kelly Cross via phone and he stated [facility] and he wants patient to go back to facility. Son stated he has a younger brother that lives at [facility] and patients always tries to take care of him. Son stated he would like patient to focus on herself so she is abel to heal appropriately. CSW stated she will reach out to facility to make them aware of patients return.  Employment status:  Retired Database administratornsurance information:  Managed Medicare PT Recommendations:  Not assessed at this time Information / Referral to community resources:  Other (Comment Required)(assist with returnback to facility)  Patient/Family's Response to care:  Son stated he can transport patient back to ALF if patient will be safe in his car. CSW will reach out to RN to make sure patient is  able to get into a car  Patient/Family's Understanding of and Emotional Response to Diagnosis, Current Treatment, and Prognosis:  Patient will go back to ALF ([facility])   Emotional Assessment Appearance:  Appears stated age Attitude/Demeanor/Rapport:  Other(spoke to family vi phone) Affect (typically observed):  Other Orientation:  Oriented to Self, Oriented to  Time, Oriented to Situation Alcohol / Substance use:  Not Applicable Psych involvement (Current and /or in the community):  No (Comment)  Discharge Needs  Concerns to be addressed:  Care Coordination Readmission within the last 30 days:  No Current discharge risk:  Dependent with Mobility Barriers to Discharge:  No Barriers Identified   Althea Charonshley C Beryle Bagsby, LCSW 05/03/2018, 10:22 AM

## 2018-05-03 NOTE — Progress Notes (Signed)
3 Days Post-Op  Subjective: Pt doing well  Objective: Vital signs in last 24 hours: Temp:  [98.2 F (36.8 C)-98.6 F (37 C)] 98.2 F (36.8 C) (12/09 0551) Pulse Rate:  [65-76] 65 (12/09 0551) Resp:  [16-18] 16 (12/09 0551) BP: (112-114)/(50-59) 112/50 (12/09 0551) SpO2:  [95 %] 95 % (12/09 0551) Weight:  [55.2 kg] 55.2 kg (12/09 0551)   Intake/Output from previous day: 12/08 0701 - 12/09 0700 In: 1466.7 [P.O.:837; I.V.:569.7] Out: 690 [Urine:200; Drains:190; Stool:300] Intake/Output this shift: Total I/O In: 340 [P.O.:240; I.V.:100] Out: 415 [Urine:175; Drains:90; Stool:150]   General appearance: alert and cooperative GI: normal findings: soft, non-tender  Incision: no significant erythema  Lab Results:  Recent Labs    05/02/18 0419 05/03/18 0418  WBC 10.4 9.5  HGB 10.5* 9.6*  HCT 33.2* 30.5*  PLT 139* 132*   BMET Recent Labs    05/02/18 0419 05/03/18 0418  NA 139 141  K 3.2* 3.5  CL 105 110  CO2 28 27  GLUCOSE 103* 125*  BUN 11 15  CREATININE 0.87 0.91  CALCIUM 8.1* 8.0*   PT/INR No results for input(s): LABPROT, INR in the last 72 hours. ABG No results for input(s): PHART, HCO3 in the last 72 hours.  Invalid input(s): PCO2, PO2  MEDS, Scheduled . acetaminophen  1,000 mg Oral Q6H  . docusate sodium  100 mg Oral BID  . memantine  28 mg Oral QHS   And  . donepezil  10 mg Oral QHS  . enoxaparin (LOVENOX) injection  40 mg Subcutaneous Q24H  . feeding supplement  237 mL Oral BID BM  . gabapentin  300 mg Oral BID  . hydrochlorothiazide  25 mg Oral Daily  . mouth rinse  15 mL Mouth Rinse BID  . pantoprazole  40 mg Oral Daily  . polyvinyl alcohol  1 drop Both Eyes QHS  . saccharomyces boulardii  250 mg Oral BID  . sertraline  100 mg Oral Daily  . zolpidem  5 mg Oral QHS    Studies/Results: No results found.  Assessment: s/p Procedure(s): XI ROBOT ASSISTED RECTOPEXY AND END COLOSTOMY ERAS PATHWAY Patient Active Problem List   Diagnosis  Date Noted  . Rectal prolapse 04/30/2018  . Rectal mucosa prolapse 11/10/2017  . Chronic pain of right knee 05/15/2016  . Dementia of the Alzheimer's type without behavioral disturbance (HCC) 10/11/2013  . Gait instability 10/11/2013  . Memory loss 09/06/2012  . Benign paroxysmal positional vertigo 09/06/2012  . High cholesterol   . Anxiety     Expected post op course  Plan: Plan for discharge tomorrow   LOS: 3 days     .Kelly PandaAlicia C Kiley Solimine, MD [facility], [place] [phone]   05/03/2018 4:40 PM

## 2018-05-03 NOTE — Discharge Summary (Addendum)
Physician Discharge Summary  Patient ID: Kelly Cross MRN: [id number] DOB/AGE: May 05, 1927 84 y.o.  Admit date: 04/30/2018 Discharge date: 05/04/2018  Admission Diagnoses: rectal prolapse  Discharge Diagnoses:  Active Problems:   Rectal prolapse   Discharged Condition: good  Hospital Course: Pt admitted after robotic assisted rectopexy and sigmoid resection with colostomy creation.  She was discharged once she was tolerating a diet and urinating without difficulty.  Her pain was controlled with tylenol and her ostomy was functioning appropriately.    Consults: None  Significant Diagnostic Studies: labs: cbc,bmet  Treatments: IV hydration, analgesia: acetaminophen and surgery: see above  Discharge Exam: Blood pressure 136/74, pulse 78, temperature 98.5 F (36.9 C), temperature source Oral, resp. rate 15, height 5\' 3"  (1.6 m), weight 55.7 kg, SpO2 93 %. General appearance: alert and cooperative GI: normal findings: soft, non-tender Incision/Wound: clean, dry, intact Ostomy pink, stool and air in bag  Disposition: Discharge disposition: 03-Skilled Nursing Facility        Allergies as of 05/04/2018   No Known Allergies     Medication List    TAKE these medications   alendronate 70 MG tablet Commonly known as:  FOSAMAX 70 mg every 7 (seven) days.   BELSOMRA 10 MG Tabs Generic drug:  Suvorexant Take 10 mg by mouth at bedtime.   CALCIUM ASPARTATE PO Take 1,600 Units by mouth daily.   CENTRUM SILVER PO Take by mouth daily.   diclofenac sodium 1 % Gel Commonly known as:  VOLTAREN   docusate sodium 100 MG capsule Commonly known as:  COLACE TAKE ONE CAPSULE BY MOUTH TWICE A DAY   hydrochlorothiazide 25 MG tablet Commonly known as:  HYDRODIURIL Take 25 mg by mouth daily.   Memantine HCl-Donepezil HCl 28-10 MG Cp24 Take 1 capsule by mouth daily. What changed:  when to take this   omeprazole 20 MG capsule Commonly known as:  PRILOSEC Take 20 mg by  mouth daily.   potassium chloride SA 20 MEQ tablet Commonly known as:  K-DUR,KLOR-CON Take 20 mEq by mouth daily.   sertraline 100 MG tablet Commonly known as:  ZOLOFT Take 100 mg by mouth daily.   TYLENOL 500 MG tablet Generic drug:  acetaminophen Take 500 mg by mouth.      Follow-up Information    Romie Leveehomas, Iktan Aikman, MD. Schedule an appointment as soon as possible for a visit in 2 weeks.   Specialty:  General Surgery Contact information: [address] [place] [address] [phone]        [facility] Follow up.   Specialty:  Home Health Services [facility] Contact information: [address] [place] [address] [phone]           Signed: Vanita Pandalicia C Annamaria Salah 05/04/2018, 10:16 AM

## 2018-05-03 NOTE — Discharge Instructions (Signed)

## 2018-05-03 NOTE — Progress Notes (Signed)
Patient was admitted to hospital from an assisted living facility. Per Dr. Maisie Fushomas, she needs a skilled facility so PT evaluation be need to be done per Olegario MessierKathy, Child psychotherapistocial Worker. Lina SarBeth Berline Semrad, RN

## 2018-05-03 NOTE — Care Management Important Message (Signed)
Important Message  Patient Details  Name: Kelly Cross [place] MRN: [id number] Date of Birth: 12/07/1926   Medicare Important Message Given:  Yes    Caren MacadamFuller, Lavonna Lampron 05/03/2018, 12:06 PMImportant Message  Patient Details  Name: Kelly Cross [place] MRN: [id number] Date of Birth: 12/07/1926   Medicare Important Message Given:  Yes    Caren MacadamFuller, Venice Marcucci 05/03/2018, 12:06 PM

## 2018-05-03 NOTE — Care Management Note (Signed)
Case Management Note  Patient Details  Name: Kelly Cross MRN: [id number] Date of Birth: 11/23/1926  Subjective/Objective: Received call from staff nurse that patient needs a SNF per attending-informed nurse-will need PT cons to assess patient's level of mobility, then we can determine tohe appropriate level of care needed-either ALF or SNF level. CM/CSW awaiting PT recc. Contacted [facility]-informed of possible d/c back-will await PT recc.                   Action/Plan:dc either back to ALF or d/c to SNF.   Expected Discharge Date:  05/03/18               Expected Discharge Plan:  Assisted Living / Rest Home  In-House Referral:  Clinical Social Work  Discharge planning Services  CM Consult  Post Acute Care Choice:    Choice offered to:  Patient  DME Arranged:    DME Agency:     HH Arranged:    HH Agency:     Status of Service:  In process, will continue to follow  If discussed at Long Length of Stay Meetings, dates discussed:    Additional Comments:  Lanier ClamMahabir, Shaye Elling, RN 05/03/2018, 12:00 PM

## 2018-05-04 NOTE — Progress Notes (Signed)
Clinical Social Worker facilitated patient discharge including contacting patient family and facility to confirm patient discharge plans.  Clinical information faxed to facility and family agreeable with plan.  CSW arranged [facility] .  RN to call [phone] for report prior to discharge.  Clinical Social Worker will sign off for now as social work intervention is no longer needed. Please consult us again if new need arises.  Marrianne MoodAshley Vipul Cafarelli, MSW, [employer]CSWA [phone]

## 2018-05-04 NOTE — Care Management Note (Signed)
Case Management Note  Patient Details  Name: Kelly Cross MRN: [id number] Date of Birth: 22-Apr-1927  Subjective/Objective: CM/CSW spoke to patient's son on phone-d/c plan return back to [facility] w/HHC-HHRN/PT/OT-son chose [facility] for HHC-rep Cassie aware of d/c & HHC orders to be put in Nursing aware. Dc back via [facility]-CSW will manage.               Action/Plan:d/c ALF w/HHC.   Expected Discharge Date:  05/04/18               Expected Discharge Plan:  Assisted Living / Rest Home  In-House Referral:  Clinical Social Work  Discharge planning Services  CM Consult  Post Acute Care Choice:    Choice offered to:  Patient  DME Arranged:    DME Agency:     HH Arranged:  RN, PT, OT HH Agency:     Status of Service:  Completed, signed off  If discussed at Long Length of Stay Meetings, dates discussed:    Additional Comments:  Lanier ClamMahabir, Parsa Rickett, RN 05/04/2018, 9:35 AM

## 2018-05-04 NOTE — Progress Notes (Signed)
[facility] Mrs. Djordjevic, who is returning there. Spoke with Washington MutualLynetta. Questions answered and Lavonne ChickLynetta denies further questions. Patient being transported by ambulance. Lina SarBeth Vennie Waymire, RN

## 2018-05-04 NOTE — Progress Notes (Signed)
Clinical Social Worker following patient and family for support and discharge needs. CSW spoke with patients son via phone and [name] stated he does not want patient to go to SNF but he would rather patient go back to ALF. CSW will continue to follow patient and family to assist with discharge back to AL[facility]    Marrianne MoodAshley Jermani Pund, MSW,  [place] [phone]

## 2018-05-05 NOTE — Progress Notes (Signed)
Post d/c entry-[facility] Lanora [facility] RN-they do not have ostomy supplies for the patient's colostomy. TC [facility] liason Cassie-HHRN for home visit tomorrow. Floor nursing staff-Leann will provide 2 ostomy bags & leave @ nurse's station for Cassie to pick up. Cassie [facility]. TC [facility] to inform her of current status of delivery of ostomy supplies for patient. No further CM needs.

## 2018-05-06 NOTE — Progress Notes (Signed)
Late entry-clarification of Director's conversation with staff-she was using the case as an example. Not for discussion of the actual case in details.

## 2018-05-06 NOTE — Progress Notes (Signed)
Late enrty f/u on ostomy supplies-[facility]-The supplies sent were not the right size,they fell off.The patient does not have any ostomy supplies. I have contacted the WOC nurse-Karen-she brought supplies to the office-Cassie-rep [facility] will pick them up & take to facility. Upon further review-There was no ostomy cons put in or csw,or CM cons. Per the charge nurse on the floor today-Terri-their director has had a meeting to discuss the case.

## 2018-06-22 NOTE — Progress Notes (Deleted)
[facility]  PATIENT: Kelly Cross DOB: [birth year]   REASON FOR VISIT: follow up for dementia HISTORY FROM:patient and son    HISTORY OF PRESENT ILLNESS:Kelly Cross is a 85 years old right-handed African American female, accompanied by her son, referred by her primary care physician for evaluation of memory loss.   She has past medical history of pulmonary sarcoidosis, was treated with steroid more than 40 years ago, also history of hypertension, anxiety, bilateral cataract surgery, she had college degree, taught sixth-grade transiently, was a homemaker,, but very actively involved in church, community service, her son stated that she really the captain of her family, has been taking care of her sick husband, who suffered Alzheimer's disease, her son who has schizophrenia, was dedicated.   She was noted to get frustrated easily over the past one year, which is not her normal self, also began to make mistakes, she forgets to take her medications sometimes, but she is still very active at home, she is supervising her son, who suffers schizophrenia, she is his main caregiver, supervising his medications, she is driving only short distance, but even that she got lost sometimes, she still pay home bills, but her son began to step in to help her  She denies significant visual loss, no lateralized motor or sensory deficit, her son provide most of the story  MRI of the brain showed mild atrophy, laboratory evaluation was normal, including B12. Son reported, she was highly functional, now struggling with her daily activity, no longer driving, she also has hard of hearing  UPDATE Feb 13 2014: She continue decline with her memory, increased confusion, also mild unsteady gait, complains of frequent low back pain, taking Tylenol almost every night to sleep, she used to take gabapentin 300 mg twice a day for restless leg syndrome, was not sure it has been helpful, she is  taking Namenda, and Aricept. She still takes care of her younger son, who suffered Schizophrenic. UPDATE December 04 2014: She is with her son Kelly Cross at today's visit She is taking Namazaric, tolerating it well, her son son is concerned about her increase the memory trouble, she has more trouble handling daily activity, such as cooking, keep up with her medications, she put Vaseline inside her mouth for her dry mouth, she is very hesitant to accept home help, which is well covered under her long-term care  UPDATE 06/06/2015; Kelly Cross, 85 year old female returns for follow-up she was last seen in the office 12/04/2014 by Dr. Terrace Arabia. She is accompanied by her son. She is currently taking Namazaric without side effects. She no longer drives. She can feed and dress herself, she needs assistance with bathing. There have been no falls since last seen. Her bedroom is upstairs. She is no longer cooking. She returns for reevaluation. Son has multiple questions about providing the best care for her. Records were reviewed UPDATE 07/12/2017CM Kelly Cross, 85 year old female returns for follow-up for memory loss, Alzheimer's dementia. She is currently taking Namzaric without side effects. She needs assistance with bathing but she can feed and dress herself. She no longer drives, she no longer cooks. She reads books. She returns for reevaluation  UPDATE Jan 16th 2018:YY She still lives with her two sons, she has quit driving in 2956, she get up every morning to fix breakfast, fix dinner, she rarely goes to stove anymore, she tried to clean the house, she walks around, trying to find misplaced things.  She still has other 7 siblings, from  age 62-96, her son Kelly Cross has insulin dependent DM, her MMSE is 23/30,  UPDATE 07/26/2018CM. Kelly Cross, 85 year old female returns for follow-up with her son for dementia. She remains on Namzaric daily. She continues to live in her own home. Her son lives with her.appetite is good, she  sleeps well, no agitation or behavior outbursts. She can feed and dress herself. She no longer cooks or drives. She returns for reevaluation UPDATE 1/28/2019CM Kelly Cross, 85 year old female returns for follow-up with history of dementia.  When last seen she was living in her own home with supervision.  She is now moved to [facility] [employer] [facility]  in 1 of their cottages.  Her son is staying with her at present for transition.  She remains on Namzaric without side effects.  No agitation or behavior outbursts.  Appetite is good and she is sleeping well.  Recently seen by urologist for prolapsed bladder.  No recent falls.  She no longer drives or cooks.  She returns for reevaluation with her son REVIEW OF SYSTEMS: Full 14 system review of systems performed and notable only for those listed, all others are neg:  Constitutional: neg  Cardiovascular: neg Ear/Nose/Throat: hearing loss  Skin: neg Eyes: neg Respiratory: neg Gastroitestinal: neg  Genitourinary incontinence of bladder, prolapse Hematology/Lymphatic: neg  Endocrine: neg Musculoskeletal:neg Allergy/Immunology: neg Neurological: memory loss Psychiatric: neg Sleep : neg   ALLERGIES: No Known Allergies  HOME MEDICATIONS: Outpatient Medications Prior to Visit  Medication Sig Dispense Refill  . acetaminophen (TYLENOL) 500 MG tablet Take 500 mg by mouth.     Marland Kitchen alendronate (FOSAMAX) 70 MG tablet 70 mg every 7 (seven) days.    . BELSOMRA 10 MG TABS Take 10 mg by mouth at bedtime.     Marland Kitchen CALCIUM ASPARTATE PO Take 1,600 Units by mouth daily.     . diclofenac sodium (VOLTAREN) 1 % GEL     . docusate sodium (COLACE) 100 MG capsule TAKE ONE CAPSULE BY MOUTH TWICE A DAY 60 capsule 1  . hydrochlorothiazide (HYDRODIURIL) 25 MG tablet Take 25 mg by mouth daily.    . Memantine HCl-Donepezil HCl (NAMZARIC) 28-10 MG CP24 Take 1 capsule by mouth daily. (Patient taking differently: Take 1 capsule by mouth at bedtime. ) 90 capsule 3  . Multiple Vitamins-Minerals  (CENTRUM SILVER PO) Take by mouth daily.    Marland Kitchen omeprazole (PRILOSEC) 20 MG capsule Take 20 mg by mouth daily.    . potassium chloride SA (K-DUR,KLOR-CON) 20 MEQ tablet Take 20 mEq by mouth daily.    . sertraline (ZOLOFT) 100 MG tablet Take 100 mg by mouth daily.   12   No facility-administered medications prior to visit.     PAST MEDICAL HISTORY: Past Medical History:  Diagnosis Date  . Alzheimer's dementia (HCC)   . Anxiety   . Arthritis   . Complication of anesthesia    harder to wake up from Anesthesia  . Family history of adverse reaction to anesthesia    son- Harder to wake up from Anesthesia  . High blood pressure   . High cholesterol   . Sarcoidosis    lungs- no problems for a long time    PAST SURGICAL HISTORY: Past Surgical History:  Procedure Laterality Date  . AURAL ATRESIA REPAIR     1983  . BACK SURGERY     1995  . eye implants     1999  . VAGINAL HYSTERECTOMY     1995  . XI ROBOT ASSISTED RECTOPEXY N/A 04/30/2018  Procedure: XI ROBOT ASSISTED RECTOPEXY AND END COLOSTOMY ERAS PATHWAY;  Surgeon: Romie Levee, MD;  Location: [facility];  Service: General;  Laterality: N/A;    FAMILY HISTORY: Family History  Problem Relation Age of Onset  . Diabetes Brother   . Leukemia Sister     SOCIAL HISTORY: Social History   Socioeconomic History  . Marital status: Widowed    Spouse name: Not on file  . Number of children: 3  . Years of education: Not on file  . Highest education level: Not on file  Occupational History  . Not on file  Social Needs  . Financial resource strain: Not on file  . Food insecurity:    Worry: Not on file    Inability: Not on file  . Transportation needs:    Medical: Not on file    Non-medical: Not on file  Tobacco Use  . Smoking status: Never Smoker  . Smokeless tobacco: Never Used  Substance and Sexual Activity  . Alcohol use: No  . Drug use: No  . Sexual activity: Not Currently  Lifestyle  . Physical activity:    Days  per week: Not on file    Minutes per session: Not on file  . Stress: Not on file  Relationships  . Social connections:    Talks on phone: Not on file    Gets together: Not on file    Attends religious service: Not on file    Active member of club or organization: Not on file    Attends meetings of clubs or organizations: Not on file    Relationship status: Not on file  . Intimate partner violence:    Fear of current or ex partner: Not on file    Emotionally abused: Not on file    Physically abused: Not on file    Forced sexual activity: Not on file  Other Topics Concern  . Not on file  Social History Narrative   Living with 2 sons.       PHYSICAL EXAM  There were no vitals filed for this visit. There is no height or weight on file to calculate BMI.  Generalized: Well developed, in no acute distress  Head: normocephalic and atraumatic,. Oropharynx benign  Neck: Supple, no carotid bruits  Cardiac: Regular rate rhythm, no murmur  Musculoskeletal: No deformity   Neurological examination   Mentation: Alert , AFT 8. Clock drawing 2/4 MMSE - Mini Mental State Exam 06/22/2017 12/18/2016 06/10/2016  Orientation to time 0 1 3  Orientation to Place 3 3 5   Registration 3 3 3   Attention/ Calculation 1 2 3   Recall 0 0 0  Language- name 2 objects 2 2 2   Language- repeat 0 1 1  Language- follow 3 step command 3 3 3   Language- read & follow direction 1 1 1   Write a sentence 1 1 1   Copy design 1 1 1   Total score 15 18 23   .  Follows all commands speech and language fluent.   Cranial nerve II-XII: .Pupils were equal round reactive to light extraocular movements were full, visual field were full on confrontational test. Facial sensation and strength were normal. hearing was intact to finger rubbing bilaterally. Uvula tongue midline. head turning and shoulder shrug were normal and symmetric.Tongue protrusion into cheek strength was normal. Motor: normal bulk and tone, full strength in  the BUE, BLE, fine finger movements normal, no pronator drift. No focal weakness Sensory: normal and symmetric to light touch, on the  face arms and legs Coordination: finger-nose-finger,  no dysmetria Reflexes: Symmetric upper and lower, plantar responses were flexor bilaterally. Gait and Station: Rising up from seated position without assistance, normal stance,  moderate stride, good arm swing, smooth turning, Romberg negative.  DIAGNOSTIC DATA (LABS, IMAGING, TESTING) - I reviewed patient records, labs, notes, testing and imaging myself where available.  Lab Results  Component Value Date   WBC 9.5 05/03/2018   HGB 9.6 (L) 05/03/2018   HCT 30.5 (L) 05/03/2018   MCV 103.4 (H) 05/03/2018   PLT 132 (L) 05/03/2018      Component Value Date/Time   NA 141 05/03/2018 0418   NA 142 09/06/2012 1122   K 3.5 05/03/2018 0418   CL 110 05/03/2018 0418   CO2 27 05/03/2018 0418   GLUCOSE 125 (H) 05/03/2018 0418   BUN 15 05/03/2018 0418   BUN 14 09/06/2012 1122   CREATININE 0.91 05/03/2018 0418   CALCIUM 8.0 (L) 05/03/2018 0418   PROT 6.9 09/06/2012 1122   ALBUMIN 3.9 09/06/2012 1122   AST 33 09/06/2012 1122   ALT 23 09/06/2012 1122   ALKPHOS 90 09/06/2012 1122   BILITOT 0.3 09/06/2012 1122   GFRNONAA 56 (L) 05/03/2018 0418   GFRAA >60 05/03/2018 0418    Lab Results  Component Value Date   VITAMINB12 1,074 (H) 09/06/2012   Lab Results  Component Value Date   TSH 0.542 09/06/2012      ASSESSMENT AND PLAN 85 y.o. year old female has a past medical history of dementia probable Alzheimer's dementia.  Continue Namzaric at current dose will refill Exercise memory by utilizing strategy games such as cards cross words, Scrabble etc. Be careful with ambulation at risk for falls especially going up and down stairs F/U yearly Patient does not qualify for either of the memory studies in research Nilda Riggs, [facility], [facility], APRN  [facility] [address]  [address] [place], [place] [address] [phone]

## 2018-06-23 ENCOUNTER — Ambulatory Visit: Payer: Medicare Other | Admitting: Nurse Practitioner

## 2018-06-23 ENCOUNTER — Telehealth: Payer: Self-pay | Admitting: *Deleted

## 2018-06-23 NOTE — Telephone Encounter (Signed)
Patient was no show for follow up with NP today.  

## 2018-06-24 ENCOUNTER — Encounter (HOSPITAL_COMMUNITY): Payer: Self-pay | Admitting: General Surgery

## 2018-07-05 NOTE — Progress Notes (Deleted)
[facility]  PATIENT: Kelly Cross DOB: [birth year]   REASON FOR VISIT: follow up for dementia HISTORY FROM:patient and son    HISTORY OF PRESENT ILLNESS:Kelly Cross is a 85 years old right-handed African American female, accompanied by her son, referred by her primary care physician for evaluation of memory loss.   She has past medical history of pulmonary sarcoidosis, was treated with steroid more than 40 years ago, also history of hypertension, anxiety, bilateral cataract surgery, she had college degree, taught sixth-grade transiently, was a homemaker,, but very actively involved in church, community service, her son stated that she really the captain of her family, has been taking care of her sick husband, who suffered Alzheimer's disease, her son who has schizophrenia, was dedicated.   She was noted to get frustrated easily over the past one year, which is not her normal self, also began to make mistakes, she forgets to take her medications sometimes, but she is still very active at home, she is supervising her son, who suffers schizophrenia, she is his main caregiver, supervising his medications, she is driving only short distance, but even that she got lost sometimes, she still pay home bills, but her son began to step in to help her  She denies significant visual loss, no lateralized motor or sensory deficit, her son provide most of the story  MRI of the brain showed mild atrophy, laboratory evaluation was normal, including B12. Son reported, she was highly functional, now struggling with her daily activity, no longer driving, she also has hard of hearing  UPDATE Feb 13 2014: She continue decline with her memory, increased confusion, also mild unsteady gait, complains of frequent low back pain, taking Tylenol almost every night to sleep, she used to take gabapentin 300 mg twice a day for restless leg syndrome, was not sure it has been helpful, she is  taking Namenda, and Aricept. She still takes care of her younger son, who suffered Schizophrenic. UPDATE December 04 2014: She is with her son Burton Apley at today's visit She is taking Namazaric, tolerating it well, her son son is concerned about her increase the memory trouble, she has more trouble handling daily activity, such as cooking, keep up with her medications, she put Vaseline inside her mouth for her dry mouth, she is very hesitant to accept home help, which is well covered under her long-term care  UPDATE 06/06/2015; Kelly Cross, 85 year old female returns for follow-up she was last seen in the office 12/04/2014 by Dr. Terrace Arabia. She is accompanied by her son. She is currently taking Namazaric without side effects. She no longer drives. She can feed and dress herself, she needs assistance with bathing. There have been no falls since last seen. Her bedroom is upstairs. She is no longer cooking. She returns for reevaluation. Son has multiple questions about providing the best care for her. Records were reviewed UPDATE 07/12/2017CM [name], 85 year old female returns for follow-up for memory loss, Alzheimer's dementia. She is currently taking Namzaric without side effects. She needs assistance with bathing but she can feed and dress herself. She no longer drives, she no longer cooks. She reads books. She returns for reevaluation  UPDATE Jan 16th 2018:Kelly Cross She still lives with her two sons, she has quit driving in 2956, she get up every morning to fix breakfast, fix dinner, she rarely goes to stove anymore, she tried to clean the house, she walks around, trying to find misplaced things.  She still has other 7 siblings, from  age 62-96, her son Burton Apley has insulin dependent DM, her MMSE is 23/30,  UPDATE 07/26/2018CM. [name], 85 year old female returns for follow-up with her son for dementia. She remains on Namzaric daily. She continues to live in her own home. Her son lives with her.appetite is good, she  sleeps well, no agitation or behavior outbursts. She can feed and dress herself. She no longer cooks or drives. She returns for reevaluation UPDATE 1/28/2019CM [name], 85 year old female returns for follow-up with history of dementia.  When last seen she was living in her own home with supervision.  She is now moved to [facility] [employer] [facility] [employer]  in 1 of their cottages.  Her son is staying with her at present for transition.  She remains on Namzaric without side effects.  No agitation or behavior outbursts.  Appetite is good and she is sleeping well.  Recently seen by urologist for prolapsed bladder.  No recent falls.  She no longer drives or cooks.  She returns for reevaluation with her son REVIEW OF SYSTEMS: Full 14 system review of systems performed and notable only for those listed, all others are neg:  Constitutional: neg  Cardiovascular: neg Ear/Nose/Throat: hearing loss  Skin: neg Eyes: neg Respiratory: neg Gastroitestinal: neg  Genitourinary incontinence of bladder, prolapse Hematology/Lymphatic: neg  Endocrine: neg Musculoskeletal:neg Allergy/Immunology: neg Neurological: memory loss Psychiatric: neg Sleep : neg   ALLERGIES: No Known Allergies  HOME MEDICATIONS: Outpatient Medications Prior to Visit  Medication Sig Dispense Refill  . acetaminophen (TYLENOL) 500 MG tablet Take 500 mg by mouth.     Marland Kitchen alendronate (FOSAMAX) 70 MG tablet 70 mg every 7 (seven) days.    . BELSOMRA 10 MG TABS Take 10 mg by mouth at bedtime.     Marland Kitchen CALCIUM ASPARTATE PO Take 1,600 Units by mouth daily.     . diclofenac sodium (VOLTAREN) 1 % GEL     . docusate sodium (COLACE) 100 MG capsule TAKE ONE CAPSULE BY MOUTH TWICE A DAY 60 capsule 1  . hydrochlorothiazide (HYDRODIURIL) 25 MG tablet Take 25 mg by mouth daily.    . Memantine HCl-Donepezil HCl (NAMZARIC) 28-10 MG CP24 Take 1 capsule by mouth daily. (Patient taking differently: Take 1 capsule by mouth at bedtime. ) 90 capsule 3  . Multiple Vitamins-Minerals  (CENTRUM SILVER PO) Take by mouth daily.    Marland Kitchen omeprazole (PRILOSEC) 20 MG capsule Take 20 mg by mouth daily.    . potassium chloride SA (K-DUR,KLOR-CON) 20 MEQ tablet Take 20 mEq by mouth daily.    . sertraline (ZOLOFT) 100 MG tablet Take 100 mg by mouth daily.   12   No facility-administered medications prior to visit.     PAST MEDICAL HISTORY: Past Medical History:  Diagnosis Date  . Alzheimer's dementia (HCC)   . Anxiety   . Arthritis   . Complication of anesthesia    harder to wake up from Anesthesia  . Family history of adverse reaction to anesthesia    son- Harder to wake up from Anesthesia  . High blood pressure   . High cholesterol   . Sarcoidosis    lungs- no problems for a long time    PAST SURGICAL HISTORY: Past Surgical History:  Procedure Laterality Date  . AURAL ATRESIA REPAIR     1983  . BACK SURGERY     1995  . eye implants     1999  . VAGINAL HYSTERECTOMY     1995  . XI ROBOT ASSISTED RECTOPEXY N/A 04/30/2018  Procedure: XI ROBOT ASSISTED RECTOPEXY AND END COLOSTOMY ERAS PATHWAY;  Surgeon: Romie Levee, MD;  Location: [facility];  Service: General;  Laterality: N/A;    FAMILY HISTORY: Family History  Problem Relation Age of Onset  . Diabetes Brother   . Leukemia Sister     SOCIAL HISTORY: Social History   Socioeconomic History  . Marital status: Widowed    Spouse name: Not on file  . Number of children: 3  . Years of education: Not on file  . Highest education level: Not on file  Occupational History  . Not on file  Social Needs  . Financial resource strain: Not on file  . Food insecurity:    Worry: Not on file    Inability: Not on file  . Transportation needs:    Medical: Not on file    Non-medical: Not on file  Tobacco Use  . Smoking status: Never Smoker  . Smokeless tobacco: Never Used  Substance and Sexual Activity  . Alcohol use: No  . Drug use: No  . Sexual activity: Not Currently  Lifestyle  . Physical activity:    Days  per week: Not on file    Minutes per session: Not on file  . Stress: Not on file  Relationships  . Social connections:    Talks on phone: Not on file    Gets together: Not on file    Attends religious service: Not on file    Active member of club or organization: Not on file    Attends meetings of clubs or organizations: Not on file    Relationship status: Not on file  . Intimate partner violence:    Fear of current or ex partner: Not on file    Emotionally abused: Not on file    Physically abused: Not on file    Forced sexual activity: Not on file  Other Topics Concern  . Not on file  Social History Narrative   Living with 2 sons.       PHYSICAL EXAM  There were no vitals filed for this visit. There is no height or weight on file to calculate BMI.  Generalized: Well developed, in no acute distress  Head: normocephalic and atraumatic,. Oropharynx benign  Neck: Supple, no carotid bruits  Cardiac: Regular rate rhythm, no murmur  Musculoskeletal: No deformity   Neurological examination   Mentation: Alert , AFT 8. Clock drawing 2/4 MMSE - Mini Mental State Exam 06/22/2017 12/18/2016 06/10/2016  Orientation to time 0 1 3  Orientation to Place 3 3 5   Registration 3 3 3   Attention/ Calculation 1 2 3   Recall 0 0 0  Language- name 2 objects 2 2 2   Language- repeat 0 1 1  Language- follow 3 step command 3 3 3   Language- read & follow direction 1 1 1   Write a sentence 1 1 1   Copy design 1 1 1   Total score 15 18 23   .  Follows all commands speech and language fluent.   Cranial nerve II-XII: .Pupils were equal round reactive to light extraocular movements were full, visual field were full on confrontational test. Facial sensation and strength were normal. hearing was intact to finger rubbing bilaterally. Uvula tongue midline. head turning and shoulder shrug were normal and symmetric.Tongue protrusion into cheek strength was normal. Motor: normal bulk and tone, full strength in  the BUE, BLE, fine finger movements normal, no pronator drift. No focal weakness Sensory: normal and symmetric to light touch, on the  face arms and legs Coordination: finger-nose-finger,  no dysmetria Reflexes: Symmetric upper and lower, plantar responses were flexor bilaterally. Gait and Station: Rising up from seated position without assistance, normal stance,  moderate stride, good arm swing, smooth turning, Romberg negative.  DIAGNOSTIC DATA (LABS, IMAGING, TESTING) - I reviewed patient records, labs, notes, testing and imaging myself where available.  Lab Results  Component Value Date   WBC 9.5 05/03/2018   HGB 9.6 (L) 05/03/2018   HCT 30.5 (L) 05/03/2018   MCV 103.4 (H) 05/03/2018   PLT 132 (L) 05/03/2018      Component Value Date/Time   NA 141 05/03/2018 0418   NA 142 09/06/2012 1122   K 3.5 05/03/2018 0418   CL 110 05/03/2018 0418   CO2 27 05/03/2018 0418   GLUCOSE 125 (H) 05/03/2018 0418   BUN 15 05/03/2018 0418   BUN 14 09/06/2012 1122   CREATININE 0.91 05/03/2018 0418   CALCIUM 8.0 (L) 05/03/2018 0418   PROT 6.9 09/06/2012 1122   ALBUMIN 3.9 09/06/2012 1122   AST 33 09/06/2012 1122   ALT 23 09/06/2012 1122   ALKPHOS 90 09/06/2012 1122   BILITOT 0.3 09/06/2012 1122   GFRNONAA 56 (L) 05/03/2018 0418   GFRAA >60 05/03/2018 0418    Lab Results  Component Value Date   VITAMINB12 1,074 (H) 09/06/2012   Lab Results  Component Value Date   TSH 0.542 09/06/2012      ASSESSMENT AND PLAN 85 y.o. year old female has a past medical history of dementia probable Alzheimer's dementia.  Continue Namzaric at current dose will refill Exercise memory by utilizing strategy games such as cards cross words, Scrabble etc. Be careful with ambulation at risk for falls especially going up and down stairs F/U yearly Patient does not qualify for either of the memory studies in research Nilda Riggs, [facility], [facility], APRN  [facility] [address]  [address] [place], [place] [address] [phone]

## 2018-07-06 ENCOUNTER — Ambulatory Visit: Payer: Medicare Other | Admitting: Nurse Practitioner

## 2018-07-07 ENCOUNTER — Encounter: Payer: Self-pay | Admitting: Nurse Practitioner

## 2018-07-20 ENCOUNTER — Encounter (HOSPITAL_COMMUNITY): Payer: Self-pay | Admitting: General Surgery

## 2019-09-22 MED ORDER — DEXTROSE 10 % IV SOLN
125.00 | INTRAVENOUS | Status: DC
Start: ? — End: 2019-09-22

## 2019-09-22 MED ORDER — POTASSIUM CHLORIDE CRYS ER 20 MEQ PO TBCR
20.00 | EXTENDED_RELEASE_TABLET | ORAL | Status: DC
Start: 2019-09-21 — End: 2019-09-22

## 2019-09-22 MED ORDER — GLUCOSE 40 % PO GEL
15.00 | ORAL | Status: DC
Start: ? — End: 2019-09-22

## 2019-09-22 MED ORDER — MELATONIN 3 MG PO TABS
3.00 | ORAL_TABLET | ORAL | Status: DC
Start: ? — End: 2019-09-22

## 2019-09-22 MED ORDER — ATORVASTATIN CALCIUM 40 MG PO TABS
40.00 | ORAL_TABLET | ORAL | Status: DC
Start: 2019-09-20 — End: 2019-09-22

## 2019-09-22 MED ORDER — LORAZEPAM 2 MG/ML IJ SOLN
0.50 | INTRAMUSCULAR | Status: DC
Start: ? — End: 2019-09-22

## 2019-09-22 MED ORDER — ONDANSETRON HCL 4 MG/2ML IJ SOLN
4.00 | INTRAMUSCULAR | Status: DC
Start: ? — End: 2019-09-22

## 2019-09-22 MED ORDER — DSS 100 MG PO CAPS
100.00 | ORAL_CAPSULE | ORAL | Status: DC
Start: ? — End: 2019-09-22

## 2019-09-22 MED ORDER — GENERIC EXTERNAL MEDICATION
12.50 | Status: DC
Start: 2019-09-20 — End: 2019-09-22

## 2019-09-22 MED ORDER — LEVETIRACETAM 500 MG PO TABS
500.00 | ORAL_TABLET | ORAL | Status: DC
Start: 2019-09-20 — End: 2019-09-22

## 2019-09-22 MED ORDER — POLYVINYL ALCOHOL 1.4 % OP SOLN
1.00 | OPHTHALMIC | Status: DC
Start: ? — End: 2019-09-22

## 2019-09-22 MED ORDER — SERTRALINE HCL 100 MG PO TABS
100.00 | ORAL_TABLET | ORAL | Status: DC
Start: 2019-09-21 — End: 2019-09-22

## 2019-09-22 MED ORDER — ACETAMINOPHEN 325 MG PO TABS
650.00 | ORAL_TABLET | ORAL | Status: DC
Start: ? — End: 2019-09-22

## 2019-09-22 MED ORDER — ONDANSETRON 4 MG PO TBDP
4.00 | ORAL_TABLET | ORAL | Status: DC
Start: ? — End: 2019-09-22

## 2019-09-22 MED ORDER — BISACODYL 5 MG PO TBEC
10.00 | DELAYED_RELEASE_TABLET | ORAL | Status: DC
Start: ? — End: 2019-09-22

## 2019-09-22 MED ORDER — SORBITOL 70 % PO SOLN
30.00 | ORAL | Status: DC
Start: ? — End: 2019-09-22

## 2019-09-22 MED ORDER — LISINOPRIL 5 MG PO TABS
5.00 | ORAL_TABLET | ORAL | Status: DC
Start: 2019-09-21 — End: 2019-09-22

## 2019-09-22 MED ORDER — LORAZEPAM 2 MG/ML IJ SOLN
2.00 | INTRAMUSCULAR | Status: DC
Start: ? — End: 2019-09-22

## 2019-09-22 MED ORDER — GLUCAGON (RDNA) 1 MG IJ KIT
1.00 | PACK | INTRAMUSCULAR | Status: DC
Start: ? — End: 2019-09-22

## 2019-09-22 MED ORDER — HEPARIN SODIUM (PORCINE) 5000 UNIT/ML IJ SOLN
5000.00 | INTRAMUSCULAR | Status: DC
Start: 2019-09-20 — End: 2019-09-22

## 2019-09-22 MED ORDER — MEMANTINE HCL 10 MG PO TABS
10.00 | ORAL_TABLET | ORAL | Status: DC
Start: 2019-09-20 — End: 2019-09-22

## 2019-09-22 MED ORDER — ASPIRIN 81 MG PO CHEW
81.00 | CHEWABLE_TABLET | ORAL | Status: DC
Start: 2019-09-21 — End: 2019-09-22

## 2019-11-28 ENCOUNTER — Encounter (HOSPITAL_COMMUNITY): Payer: Self-pay | Admitting: Emergency Medicine

## 2019-11-28 ENCOUNTER — Emergency Department (HOSPITAL_COMMUNITY): Payer: Medicare PPO

## 2019-11-28 ENCOUNTER — Other Ambulatory Visit: Payer: Self-pay

## 2019-11-28 ENCOUNTER — Inpatient Hospital Stay (HOSPITAL_COMMUNITY)
Admission: EM | Admit: 2019-11-28 | Discharge: 2019-12-05 | DRG: 682 | Disposition: A | Discharge status: Skilled Nursing Facility | Payer: Medicare PPO | Source: Skilled Nursing Facility | Attending: Internal Medicine | Admitting: Internal Medicine

## 2019-11-28 DIAGNOSIS — R778 Other specified abnormalities of plasma proteins: Secondary | ICD-10-CM | POA: Insufficient documentation

## 2019-11-28 DIAGNOSIS — Z833 Family history of diabetes mellitus: Secondary | ICD-10-CM

## 2019-11-28 DIAGNOSIS — Z515 Encounter for palliative care: Secondary | ICD-10-CM | POA: Diagnosis not present

## 2019-11-28 DIAGNOSIS — G9341 Metabolic encephalopathy: Secondary | ICD-10-CM | POA: Diagnosis present

## 2019-11-28 DIAGNOSIS — E872 Acidosis: Secondary | ICD-10-CM | POA: Diagnosis present

## 2019-11-28 DIAGNOSIS — E878 Other disorders of electrolyte and fluid balance, not elsewhere classified: Secondary | ICD-10-CM | POA: Diagnosis present

## 2019-11-28 DIAGNOSIS — F0280 Dementia in other diseases classified elsewhere without behavioral disturbance: Secondary | ICD-10-CM | POA: Diagnosis present

## 2019-11-28 DIAGNOSIS — I5022 Chronic systolic (congestive) heart failure: Secondary | ICD-10-CM | POA: Diagnosis present

## 2019-11-28 DIAGNOSIS — G40909 Epilepsy, unspecified, not intractable, without status epilepticus: Secondary | ICD-10-CM

## 2019-11-28 DIAGNOSIS — M810 Age-related osteoporosis without current pathological fracture: Secondary | ICD-10-CM | POA: Diagnosis present

## 2019-11-28 DIAGNOSIS — Z6822 Body mass index (BMI) 22.0-22.9, adult: Secondary | ICD-10-CM | POA: Diagnosis not present

## 2019-11-28 DIAGNOSIS — F419 Anxiety disorder, unspecified: Secondary | ICD-10-CM | POA: Diagnosis present

## 2019-11-28 DIAGNOSIS — Z20822 Contact with and (suspected) exposure to covid-19: Secondary | ICD-10-CM | POA: Diagnosis present

## 2019-11-28 DIAGNOSIS — E870 Hyperosmolality and hypernatremia: Secondary | ICD-10-CM | POA: Diagnosis present

## 2019-11-28 DIAGNOSIS — R531 Weakness: Secondary | ICD-10-CM | POA: Diagnosis not present

## 2019-11-28 DIAGNOSIS — Z806 Family history of leukemia: Secondary | ICD-10-CM

## 2019-11-28 DIAGNOSIS — N179 Acute kidney failure, unspecified: Secondary | ICD-10-CM | POA: Diagnosis present

## 2019-11-28 DIAGNOSIS — E7800 Pure hypercholesterolemia, unspecified: Secondary | ICD-10-CM | POA: Diagnosis present

## 2019-11-28 DIAGNOSIS — D696 Thrombocytopenia, unspecified: Secondary | ICD-10-CM | POA: Diagnosis present

## 2019-11-28 DIAGNOSIS — Z90710 Acquired absence of both cervix and uterus: Secondary | ICD-10-CM

## 2019-11-28 DIAGNOSIS — J90 Pleural effusion, not elsewhere classified: Secondary | ICD-10-CM | POA: Insufficient documentation

## 2019-11-28 DIAGNOSIS — G40802 Other epilepsy, not intractable, without status epilepticus: Secondary | ICD-10-CM | POA: Diagnosis present

## 2019-11-28 DIAGNOSIS — Z7189 Other specified counseling: Secondary | ICD-10-CM | POA: Diagnosis not present

## 2019-11-28 DIAGNOSIS — I248 Other forms of acute ischemic heart disease: Secondary | ICD-10-CM | POA: Diagnosis present

## 2019-11-28 DIAGNOSIS — Z7982 Long term (current) use of aspirin: Secondary | ICD-10-CM

## 2019-11-28 DIAGNOSIS — K760 Fatty (change of) liver, not elsewhere classified: Secondary | ICD-10-CM | POA: Diagnosis present

## 2019-11-28 DIAGNOSIS — Z66 Do not resuscitate: Secondary | ICD-10-CM | POA: Diagnosis present

## 2019-11-28 DIAGNOSIS — R748 Abnormal levels of other serum enzymes: Secondary | ICD-10-CM | POA: Diagnosis not present

## 2019-11-28 DIAGNOSIS — E785 Hyperlipidemia, unspecified: Secondary | ICD-10-CM | POA: Diagnosis present

## 2019-11-28 DIAGNOSIS — D649 Anemia, unspecified: Secondary | ICD-10-CM | POA: Diagnosis present

## 2019-11-28 DIAGNOSIS — I5042 Chronic combined systolic (congestive) and diastolic (congestive) heart failure: Secondary | ICD-10-CM | POA: Diagnosis present

## 2019-11-28 DIAGNOSIS — I110 Hypertensive heart disease with heart failure: Secondary | ICD-10-CM | POA: Diagnosis present

## 2019-11-28 DIAGNOSIS — Z8673 Personal history of transient ischemic attack (TIA), and cerebral infarction without residual deficits: Secondary | ICD-10-CM

## 2019-11-28 DIAGNOSIS — R7989 Other specified abnormal findings of blood chemistry: Secondary | ICD-10-CM | POA: Insufficient documentation

## 2019-11-28 DIAGNOSIS — Z888 Allergy status to other drugs, medicaments and biological substances status: Secondary | ICD-10-CM

## 2019-11-28 DIAGNOSIS — E860 Dehydration: Secondary | ICD-10-CM | POA: Diagnosis present

## 2019-11-28 DIAGNOSIS — Z933 Colostomy status: Secondary | ICD-10-CM

## 2019-11-28 DIAGNOSIS — G2581 Restless legs syndrome: Secondary | ICD-10-CM | POA: Diagnosis present

## 2019-11-28 DIAGNOSIS — G309 Alzheimer's disease, unspecified: Secondary | ICD-10-CM | POA: Diagnosis present

## 2019-11-28 DIAGNOSIS — R1310 Dysphagia, unspecified: Secondary | ICD-10-CM | POA: Diagnosis present

## 2019-11-28 DIAGNOSIS — R627 Adult failure to thrive: Secondary | ICD-10-CM | POA: Diagnosis present

## 2019-11-28 DIAGNOSIS — K7469 Other cirrhosis of liver: Secondary | ICD-10-CM | POA: Diagnosis present

## 2019-11-28 DIAGNOSIS — D860 Sarcoidosis of lung: Secondary | ICD-10-CM | POA: Diagnosis present

## 2019-11-28 DIAGNOSIS — J189 Pneumonia, unspecified organism: Secondary | ICD-10-CM

## 2019-11-28 DIAGNOSIS — Z7983 Long term (current) use of bisphosphonates: Secondary | ICD-10-CM

## 2019-11-28 DIAGNOSIS — Z79899 Other long term (current) drug therapy: Secondary | ICD-10-CM

## 2019-11-28 LAB — OSMOLALITY: Osmolality: 350 mOsm/kg (ref 275–295)

## 2019-11-28 LAB — BLOOD GAS, VENOUS
Acid-base deficit: 2.3 mmol/L — ABNORMAL HIGH (ref 0.0–2.0)
Bicarbonate: 22.4 mmol/L (ref 20.0–28.0)
O2 Saturation: 24.9 %
Patient temperature: 98.6
pCO2, Ven: 40.3 mmHg — ABNORMAL LOW (ref 44.0–60.0)
pH, Ven: 7.363 (ref 7.250–7.430)
pO2, Ven: <31 mmHg — CL (ref 32.0–45.0)

## 2019-11-28 LAB — COMPREHENSIVE METABOLIC PANEL
ALT: 166 U/L — ABNORMAL HIGH (ref 0–44)
AST: 168 U/L — ABNORMAL HIGH (ref 15–41)
Albumin: 3.4 g/dL — ABNORMAL LOW (ref 3.5–5.0)
Alkaline Phosphatase: 253 U/L — ABNORMAL HIGH (ref 38–126)
Anion gap: 9 (ref 5–15)
BUN: 49 mg/dL — ABNORMAL HIGH (ref 8–23)
CO2: 24 mmol/L (ref 22–32)
Calcium: 8.9 mg/dL (ref 8.9–10.3)
Chloride: 126 mmol/L — ABNORMAL HIGH (ref 98–111)
Creatinine, Ser: 1.53 mg/dL — ABNORMAL HIGH (ref 0.44–1.00)
GFR calc Af Amer: 34 mL/min — ABNORMAL LOW (ref >60)
GFR calc non Af Amer: 30 mL/min — ABNORMAL LOW (ref >60)
Glucose, Bld: 133 mg/dL — ABNORMAL HIGH (ref 70–99)
Potassium: 3.9 mmol/L (ref 3.5–5.1)
Sodium: 159 mmol/L — ABNORMAL HIGH (ref 135–145)
Total Bilirubin: 1.1 mg/dL (ref 0.3–1.2)
Total Protein: 7 g/dL (ref 6.5–8.1)

## 2019-11-28 LAB — MAGNESIUM: Magnesium: 2.5 mg/dL — ABNORMAL HIGH (ref 1.7–2.4)

## 2019-11-28 LAB — CBC WITH DIFFERENTIAL/PLATELET
Abs Immature Granulocytes: 0.03 10*3/uL (ref 0.00–0.07)
Basophils Absolute: 0 10*3/uL (ref 0.0–0.1)
Basophils Relative: 0 %
Eosinophils Absolute: 0 10*3/uL (ref 0.0–0.5)
Eosinophils Relative: 0 %
HCT: 35.5 % — ABNORMAL LOW (ref 36.0–46.0)
Hemoglobin: 11.1 g/dL — ABNORMAL LOW (ref 12.0–15.0)
Immature Granulocytes: 0 %
Lymphocytes Relative: 16 %
Lymphs Abs: 1.4 10*3/uL (ref 0.7–4.0)
MCH: 32.6 pg (ref 26.0–34.0)
MCHC: 31.3 g/dL (ref 30.0–36.0)
MCV: 104.4 fL — ABNORMAL HIGH (ref 80.0–100.0)
Monocytes Absolute: 0.7 10*3/uL (ref 0.1–1.0)
Monocytes Relative: 9 %
Neutro Abs: 6.5 10*3/uL (ref 1.7–7.7)
Neutrophils Relative %: 75 %
Platelets: 114 10*3/uL — ABNORMAL LOW (ref 150–400)
RBC: 3.4 MIL/uL — ABNORMAL LOW (ref 3.87–5.11)
RDW: 16.4 % — ABNORMAL HIGH (ref 11.5–15.5)
WBC: 8.6 10*3/uL (ref 4.0–10.5)
nRBC: 0 % (ref 0.0–0.2)

## 2019-11-28 LAB — LACTIC ACID, PLASMA
Lactic Acid, Venous: 2.5 mmol/L (ref 0.5–1.9)
Lactic Acid, Venous: 1.7 mmol/L (ref 0.5–1.9)

## 2019-11-28 LAB — TROPONIN I (HIGH SENSITIVITY)
Troponin I (High Sensitivity): 151 ng/L (ref <18)
Troponin I (High Sensitivity): 171 ng/L (ref <18)

## 2019-11-28 LAB — CK: Total CK: 145 U/L (ref 38–234)

## 2019-11-28 LAB — BRAIN NATRIURETIC PEPTIDE: B Natriuretic Peptide: >4500 pg/mL — ABNORMAL HIGH (ref 0.0–100.0)

## 2019-11-28 LAB — AMMONIA: Ammonia: 40 umol/L — ABNORMAL HIGH (ref 9–35)

## 2019-11-28 LAB — SARS CORONAVIRUS 2 BY RT PCR (HOSPITAL ORDER, PERFORMED IN ~~LOC~~ HOSPITAL LAB): SARS Coronavirus 2: NEGATIVE

## 2019-11-28 LAB — PHOSPHORUS: Phosphorus: 6 mg/dL — ABNORMAL HIGH (ref 2.5–4.6)

## 2019-11-28 MED ORDER — SODIUM CHLORIDE 0.9 % IV BOLUS
1000.0000 mL | Freq: Once | INTRAVENOUS | Status: AC
  Administered 2019-11-28: 1000 mL via INTRAVENOUS

## 2019-11-28 MED ORDER — FUROSEMIDE 10 MG/ML IJ SOLN
40.0000 mg | Freq: Once | INTRAMUSCULAR | Status: DC
  Filled 2019-11-28: qty 4

## 2019-11-28 NOTE — H&P (Signed)
Kelly Cross ZOX:[id number] DOB: April 17, 1927 DOA: 11/28/2019     PCP: Lorenda Ishihara, MD   Outpatient Specialists:   CARDS: * Dr. NEphrology: *  Dr. NEurology *   Dr. Pulmonary *  Dr.  Oncology * Dr. Sandria Manly* Dr.  Deboraha Sprang, [facility]) Urology Dr. *  Patient arrived to ER on 11/28/19 at 1620 Referred by Attending Charlynne Pander, MD   Patient coming from:  From facility [facility]  Chief Complaint:  Chief Complaint  Patient presents with   Abnormal Lab   Fatigue    HPI: Kelly Cross is a 86 y.o. female with medical history significant of dementia, hypertension, HLD, sarcoidosis with no pulmonary involvement, recent seizure, osteoporosis, stroke, cirrhosis     Presented with   * Patient had decreased p.o. intake for the past few days at the nursing facility her labs were checked and she found to have sodium of 158 and was sent to emergency department.  At her baseline patient verbalizes very rarely she has significant dementia by nursing staff at the [facility] noted that she was more lethargic than her usual self.  She was having some crackles on exam that is why EMS was judicious with IV fluid use although she did appear to be dehydrated.  EMS administered 500 mL of fluid.  On their arrival blood pressure was 127/86 heart rate 61 she was afebrile with blood sugar 161 satting 97% room air.  In May patient was admitted to [facility] with seizures felt to be secondary to hypoglycemia as well as prior CVAs in the Alzheimer's induced epilepsy she undergone further evaluation including CT MRI and EEG and was discharged back to facility on Keppra 750 twice daily and her donepezil was discontinued Her hypoglycemia at that time felt to be secondary to accidental overdose with glipizide as her hypoglycemic panel was positive for glipizide  She was also noted to have elevated LFTs in the past on 1st of  June AST was 121 ALT 193 CT scan of the liver done at that  time showed possibility of early cirrhosis With diffuse fatty infiltration of the liver and gallstones early cirrhosis cirrhosis was confirmed with ultrasound of the liver as well  Infectious risk factors:  Unable to provide      Initial COVID TEST  NEGATIVE   Lab Results  Component Value Date   SARSCOV2NAA NEGATIVE 11/28/2019     Regarding pertinent Chronic problems:     Hyperlipidemia -   on statins on Lipitor    HTN on lisinopril, Toprol   chronic CHF diastolic/systolic/ combined - last echo May 2021 EF 35%    Hx of CVA -  with/out residual deficits on Aspirin 81 mg, and statin      Dementia - on  Nemenda   Cirrhosis of the liver as per Imaging  While in ER: Noted to be hypernatremic but chest x-ray showed bilateral pleural effusions. Judicious use of fluids  Avoid over aggressive interventions per family  Hospitalist was called for admission for failure to thrive hypernatremia  The following Work up has been ordered so far:  Orders Placed This Encounter  Procedures   SARS Coronavirus 2 by RT PCR (hospital order, performed in [facility] hospital lab) Nasopharyngeal Nasopharyngeal Swab   DG Chest Port 1 View   CBC with Differential/Platelet   Comprehensive metabolic panel   Osmolality   Urinalysis, Routine w reflex microscopic   Osmolality, urine   Brain natriuretic peptide  Blood gas, venous   Consult to hospitalist  ALL PATIENTS BEING ADMITTED/HAVING PROCEDURES NEED COVID-19 SCREENING   EKG 12-Lead   Saline lock IV     Following Medications were ordered in ER: Medications  sodium chloride 0.9 % bolus 1,000 mL (0 mLs Intravenous Stopped 11/28/19 1834)        Consult Orders  (From admission, onward)         Start     Ordered   11/28/19 1824  Consult to hospitalist  ALL PATIENTS BEING ADMITTED/HAVING PROCEDURES NEED COVID-19 SCREENING  Once       Comments: ALL PATIENTS BEING ADMITTED/HAVING PROCEDURES NEED COVID-19 SCREENING   Provider:  (Not yet assigned)  Question Answer Comment  Place call to: [facility]   Reason for Consult Admit      11/28/19 1823           Significant initial  Findings: Abnormal Labs Reviewed  CBC WITH DIFFERENTIAL/PLATELET - Abnormal; Notable for the following components:      Result Value   RBC 3.40 (*)    Hemoglobin 11.1 (*)    HCT 35.5 (*)    MCV 104.4 (*)    RDW 16.4 (*)    Platelets 114 (*)    All other components within normal limits  COMPREHENSIVE METABOLIC PANEL - Abnormal; Notable for the following components:   Sodium 159 (*)    Chloride 126 (*)    Glucose, Bld 133 (*)    BUN 49 (*)    Creatinine, Ser 1.53 (*)    Albumin 3.4 (*)    AST 168 (*)    ALT 166 (*)    Alkaline Phosphatase 253 (*)    GFR calc non Af Amer 30 (*)    GFR calc Af Amer 34 (*)    All other components within normal limits  BRAIN NATRIURETIC PEPTIDE - Abnormal; Notable for the following components:   B Natriuretic Peptide >4,500.0 (*)    All other components within normal limits  BLOOD GAS, VENOUS - Abnormal; Notable for the following components:   pCO2, Ven 40.3 (*)    pO2, Ven <31.0 (*)    Acid-base deficit 2.3 (*)    All other components within normal limits  LACTIC ACID, PLASMA - Abnormal; Notable for the following components:   Lactic Acid, Venous 2.5 (*)    All other components within normal limits  AMMONIA - Abnormal; Notable for the following components:   Ammonia 40 (*)    All other components within normal limits  TROPONIN I (HIGH SENSITIVITY) - Abnormal; Notable for the following components:   Troponin I (High Sensitivity) 151 (*)    All other components within normal limits    Otherwise labs showing:    Recent Labs  Lab 11/28/19 1703  NA 159*  K 3.9  CO2 24  GLUCOSE 133*  BUN 49*  CREATININE 1.53*  CALCIUM 8.9    Cr    Up from baseline see below Lab Results  Component Value Date   CREATININE 1.53 (H) 11/28/2019   CREATININE 0.91 05/03/2018    CREATININE 0.87 05/02/2018    Recent Labs  Lab 11/28/19 1703  AST 168*  ALT 166*  ALKPHOS 253*  BILITOT 1.1  PROT 7.0  ALBUMIN 3.4*   Lab Results  Component Value Date   CALCIUM 8.9 11/28/2019     WBC      Component Value Date/Time   WBC 8.6 11/28/2019 1703   ANC    Component Value  Date/Time   NEUTROABS 6.5 11/28/2019 1703   ALC No components found for: LYMPHAB    Plt: Lab Results  Component Value Date   PLT 114 (L) 11/28/2019    Lactic Acid, Venous    Component Value Date/Time   LATICACIDVEN 2.5 (HH) 11/28/2019 1935       COVID-19 Labs  No results for input(s): DDIMER, FERRITIN, LDH, CRP in the last 72 hours.  No results found for: SARSCOV2NAA   ABG    Component Value Date/Time   HCO3 22.4 11/28/2019 1702   ACIDBASEDEF 2.3 (H) 11/28/2019 1702   O2SAT 24.9 11/28/2019 1702      HG/HCT  Stable,     Component Value Date/Time   HGB 11.1 (L) 11/28/2019 1703   HCT 35.5 (L) 11/28/2019 1703      Recent Labs  Lab 11/28/19 1935  AMMONIA 40*      Troponin 150    ECG: Ordered Personally reviewed by me showing: HR : 79 Rhythm RBBB,LVH  nonspecific changes  QTC 492   BNP (last 3 results) Recent Labs    11/28/19 1703  BNP >4,500.0*   DM  labs:     UA  ordered      Ordered     CXR - Small to moderate sized bilateral pleural effusions, right greater than left.  Cardiomegaly and bilateral atelectasis similar to prior      ED Triage Vitals  Enc Vitals Group     BP 11/28/19 1634 123/82     Pulse Rate 11/28/19 1634 81     Resp 11/28/19 1634 15     Temp 11/28/19 1706 99.9 F (37.7 C)     Temp Source 11/28/19 1706 Rectal     SpO2 11/28/19 1634 93 %     Weight --      Height --      Head Circumference --      Peak Flow --      Pain Score --      Pain Loc --      Pain Edu? --      Excl. in GC? --   TMAX(24)@       Latest  Blood pressure 126/81, pulse 82, temperature 99.9 F (37.7 C), temperature source Rectal, resp. rate  (!) 27, SpO2 97 %.      Review of Systems:    Pertinent positives include: Confusion  Constitutional:  No weight loss, night sweats, Fevers, chills, fatigue, weight loss  HEENT:  No headaches, Difficulty swallowing,Tooth/dental problems,Sore throat,  No sneezing, itching, ear ache, nasal congestion, post nasal drip,  Cardio-vascular:  No chest pain, Orthopnea, PND, anasarca, dizziness, palpitations.no Bilateral lower extremity swelling  GI:  No heartburn, indigestion, abdominal pain, nausea, vomiting, diarrhea, change in bowel habits, loss of appetite, melena, blood in stool, hematemesis Resp:  no shortness of breath at rest. No dyspnea on exertion, No excess mucus, no productive cough, No non-productive cough, No coughing up of blood.No change in color of mucus.No wheezing. Skin:  no rash or lesions. No jaundice GU:  no dysuria, change in color of urine, no urgency or frequency. No straining to urinate.  No flank pain.  Musculoskeletal:  No joint pain or no joint swelling. No decreased range of motion. No back pain.  Psych:  No change in mood or affect. No depression or anxiety. No memory loss.  Neuro: no localizing neurological complaints, no tingling, no weakness, no double vision, no gait abnormality, no slurred speech,   All systems  reviewed and apart from HOPI all are negative  Past Medical History:   Past Medical History:  Diagnosis Date   Alzheimer's dementia (HCC)    Anxiety    Arthritis    Complication of anesthesia    harder to wake up from Anesthesia   Family history of adverse reaction to anesthesia    son- Harder to wake up from Anesthesia   High blood pressure    High cholesterol    Sarcoidosis    lungs- no problems for a long time     Past Surgical History:  Procedure Laterality Date   AURAL ATRESIA REPAIR     1983   BACK SURGERY     1995   eye implants     1999   VAGINAL HYSTERECTOMY     1995   XI ROBOT ASSISTED RECTOPEXY N/A  04/30/2018   Procedure: XI ROBOT ASSISTED RECTOPEXY AND END COLOSTOMY ERAS PATHWAY;  Surgeon: Romie Levee, MD;  Location: [facility];  Service: General;  Laterality: N/A;    Social History:     reports that she has never smoked. She has never used smokeless tobacco. She reports that she does not drink alcohol and does not use drugs.   Family History:   Family History  Problem Relation Age of Onset   Diabetes Brother    Leukemia Sister     Allergies: No Known Allergies   Prior to Admission medications   Medication Sig Start Date End Date Taking? Authorizing Provider  acetaminophen (TYLENOL) 500 MG tablet Take 500 mg by mouth.     [provider]  alendronate (FOSAMAX) 70 MG tablet 70 mg every 7 (seven) days. 06/06/17   [provider]  BELSOMRA 10 MG TABS Take 10 mg by mouth at bedtime.  12/03/14   [provider]  CALCIUM ASPARTATE PO Take 1,600 Units by mouth daily.     [provider]  diclofenac sodium (VOLTAREN) 1 % GEL  03/07/16   [provider]  docusate sodium (COLACE) 100 MG capsule TAKE ONE CAPSULE BY MOUTH TWICE A DAY 03/05/18   Anyanwu, Jethro Bastos, MD  hydrochlorothiazide (HYDRODIURIL) 25 MG tablet Take 25 mg by mouth daily.    [provider]  Memantine HCl-Donepezil HCl (NAMZARIC) 28-10 MG CP24 Take 1 capsule by mouth daily. Patient taking differently: Take 1 capsule by mouth at bedtime.  06/22/17   Nilda Riggs, NP  Multiple Vitamins-Minerals (CENTRUM SILVER PO) Take by mouth daily.    [provider]  omeprazole (PRILOSEC) 20 MG capsule Take 20 mg by mouth daily. 09/23/13   [provider]  potassium chloride SA (K-DUR,KLOR-CON) 20 MEQ tablet Take 20 mEq by mouth daily. 04/05/18   [provider]  sertraline (ZOLOFT) 100 MG tablet Take 100 mg by mouth daily.  09/16/14   [provider]   Physical Exam: Vitals with BMI 11/28/2019 11/28/2019 11/28/2019  Height - - -  Weight - - -   BMI - - -  Systolic 126 124 914  Diastolic 81 74 81  Pulse 82 79 84     1. General:  in No  Acute distress   Chronically ill  -appearing 2. Psychological: Alert and not Oriented 3. Head/ENT:   Dry Mucous Membranes                          Head Non traumatic, neck supple  Poor Dentition                         Some JVD                      Chewing on fibrous substance in her mouth attempted to clear out 4. SKIN:  decreased Skin turgor,  Skin clean Dry and intact no rash 5. Heart: Regular rate and rhythm no  Murmur, no Rub or gallop 6. Lungs  no wheezes some crackles   7. Abdomen: Soft non-tender, Non distended  Colostomy in place bowel sounds present 8. Lower extremities: no clubbing, cyanosis, no edema 9. Neurologically Grossly intact, moving all 4 extremities equally  10. MSK: Normal range of motion   All other LABS:     Recent Labs  Lab 11/28/19 1703  WBC 8.6  NEUTROABS 6.5  HGB 11.1*  HCT 35.5*  MCV 104.4*  PLT 114*     Recent Labs  Lab 11/28/19 1703  NA 159*  K 3.9  CL 126*  CO2 24  GLUCOSE 133*  BUN 49*  CREATININE 1.53*  CALCIUM 8.9     Recent Labs  Lab 11/28/19 1703  AST 168*  ALT 166*  ALKPHOS 253*  BILITOT 1.1  PROT 7.0  ALBUMIN 3.4*       Cultures: No results found for: SDES, SPECREQUEST, CULT, REPTSTATUS   Radiological Exams on Admission: DG Chest Port 1 View  Result Date: 11/28/2019 CLINICAL DATA:  Shortness of breath. EXAM: PORTABLE CHEST 1 VIEW COMPARISON:  None. FINDINGS: Mild atelectasis and/or in the bilateral lower lobes. Small to moderate sized bilateral pleural effusions are seen, right greater than left. No pneumothorax is identified. The cardiac silhouette is moderately enlarged. The visualized skeletal structures are unremarkable. IMPRESSION: 1. Small to moderate sized bilateral pleural effusions, right greater than left. 2. Mild bilateral lower lobes atelectasis and/or infiltrate. 3. Moderate  cardiomegaly. Electronically Signed   By: Aram Candela M.D.   On: 11/28/2019 18:04    Chart has been reviewed    Assessment/Plan   86 y.o. female with medical history significant of dementia, hypertension, HLD, sarcoidosis with no pulmonary involvement, recent seizure, osteoporosis, stroke Admitted for dehydration hypernatremia and AKI with concomitant bilateral pleural effusions  Present on Admission:  AKI (acute kidney injury) (HCC)  - most likely due to dehydration, hold lisinopril pain urine electrolytes.  Gently rehydrate and monitor fluid status avoid over aggressive fluid resuscitation as this can worsen pleural effusions   High cholesterol -hold Lipitor given elevated LFTs   Anxiety -treat as needed   Dementia of the Alzheimer's type without behavioral disturbance (HCC) -anticipate some degree of sundowning while hospitalized Continue Namenda   Chronic systolic CHF (congestive heart failure) (HCC) -difficult fluid status.  Patient appears to have AKI increased BUN and hypernatremia all suggestive of dehydration is consistent with history of not eating on in our hand she has bilateral pleural effusions which as per review of records a chronic right worse than left associated also with liver disease.  For right now give gentle IV fluids and monitor   Hypernatremia -give gentle half-normal saline and monitor Frequent sodium checks   Dehydration -gently rehydrate   Anemia chronic obtain anemia panel in and evaluated MCV to see if there is reversible causes   Elevated troponin in the setting of demand ischemia and worsening renal clearance no chest pain no changes suggestive of acute ischemia.  Continue to monitor  Other cirrhosis of liver (HCC) -patient never had alcohol abuse.  Most likely secondary to fatty liver disease.  Discussed with family.  Will continue supportive measures check ammonia and INR    Pleural effusion -multifactorial patient with known  history of CHF as well as early cirrhosis.  Monitor fluid status, if persists or worsens could discuss with family possibility of thoracentesis versus seeing if able to respond to diuresis once intravascularly repleted   Elevated liver enzymes -known history of cirrhosis.  Most likely secondary to fatty liver infiltration.  Continue to monitor check INR check ammonia level  Colostomy in place -order wound care colostomy care consult  Dysphagia per family patient have been having trouble swallowing will have speech pathology evaluate  Elevated lactic acid in the setting of liver disease, may explain slight decrease acidosis will follow and repeat with gentle rehydration  History of seizure disorders continue Keppra  History of stroke continue aspirin if able to tolerate hold Lipitor given elevated LFTs  Other plan as per orders.  DVT prophylaxis:  SCD       Code Status:  DNR/DNI lean towards comfort care as per  family get official pallitive care.  I had personally discussed CODE STATUS with   Family  I had spent 15 min discussing goals of care and CODE STATUS Antibiotics, judicious, no feeding tubes, give pain meds and anxiolytics   Family Communication:   Family not  at  Bedside  plan of care was discussed on the phone with  Son,   Disposition Plan:                             Back to current facility when stable                            Following barriers for discharge:                            Electrolytes corrected                                                                                     Would benefit from PT/OT eval prior to DC  Ordered                   Swallow eval - SLP ordered                                     Transition of care consulted                   Nutrition    consulted                  Wound care  consulted                   Palliative care    consulted  Consults called: none  Admission status:  ED  Disposition    ED Disposition Condition Comment   Admit  [facility]ospital Area: [facility] [[id number]]  Level of Care: Telemetry [5]  Admit to tele based on following criteria: Acute CHF  May admit patient to [facility] or [facility] if equivalent level of care is available:: No  Covid Evaluation: Asymptomatic Screening Protocol (No Symptoms)  Diagnosis: AKI (acute kidney injury) [facility]) [[id number]]  Admitting Physician: Therisa Doyne [[id number]]  Attending Physician: Therisa Doyne [[id number]]  Estimated length of stay: past midnight tomorrow  Certification:: I certify this patient will need inpatient services for at least 2 midnights         inpatient     I Expect 2 midnight stay secondary to severity of patient's current illness need for inpatient interventions justified by the following:   Severe lab/radiological/exam abnormalities including:   Hypernatremia and AKI and extensive comorbidities including:  CHF  dementia  liver disease  history of stroke with residual deficits  Colostomy in place  That are currently affecting medical management.   I expect  patient to be hospitalized for 2 midnights requiring inpatient medical care.  Patient is at high risk for adverse outcome (such as loss of life or disability) if not treated.  Indication for inpatient stay as follows:  Severe change from baseline regarding mental status   inability to maintain oral hydration    Need for  , IV fluids     Level of care    tele  For  24H         Lab Results  Component Value Date   SARSCOV2NAA NEGATIVE 11/28/2019     Precautions: admitted as  Covid Negative   PPE: Used by the provider:   N95  eye Goggles,  Gloves     Randell Teare 11/28/2019, 8:36 PM    [facility]     after 2 AM please page floor coverage PA If 7AM-7PM, please contact the day team taking care of the patient using [email or web address]   Patient was evaluated in the context of the global  COVID-19 pandemic, which necessitated consideration that the patient might be at risk for infection with the SARS-CoV-2 virus that causes COVID-19. Institutional protocols and algorithms that pertain to the evaluation of patients at risk for COVID-19 are in a state of rapid change based on information released by regulatory bodies including the CDC and federal and state organizations. These policies and algorithms were followed during the patient's care.

## 2019-11-28 NOTE — ED Notes (Signed)
Date and time results received: 11/28/19 8:52 PM  (use smartphrase ".now" to insert current time)  Test: Lactic Acid ; Troponin Critical Value: 2.5 ; 171  Name of Provider Notified: Dr.Doutova   Orders Received? Or Actions Taken?:

## 2019-11-28 NOTE — Progress Notes (Signed)
Dual skin assessment completed with Pa H RN. Noted a stage 1 pressure ulcer at the sacral area, healed. Covered with foam. Skin is dry, no swelling noted.

## 2019-11-28 NOTE — Progress Notes (Addendum)
Patient arrived as new admit via stretcher from ED. Patient is awake, alert, on 2L/[place] at 99%, with rare verbalization as baselinne, unable to assess orientation. Hooked to tele monitor.  Bed in the lowest position, other unit safety protocol implemented. Will continue to monitor patient.

## 2019-11-28 NOTE — ED Triage Notes (Signed)
Patient presents from [facility] due to abnormal labs.  Critical labs: Sodium 158, chloride 123, CO2 18. Patient is A&O x1 at baseline with rare verbalization. Staff did note the patient seemed more lethargic than usual. EMS administered 500 cc of fluid. Patient has a colostomy.     127/86 BP 61 HR 28 Resp Rate 97%  161 CBG 97.9 Temp

## 2019-11-28 NOTE — ED Provider Notes (Signed)
[facility]-EMERGENCY DEPT Provider Note   CSN: [id number] Arrival date & time: 11/28/19  1620     History Chief Complaint  Patient presents with  . Abnormal Lab  . Fatigue    Kelly Cross is a 86 y.o. woman with history of Alzheimer's, seizures, chronic systolic heart failure, and hypertension who presents to the ED from [facility] via EMS for abnormal labs (hypernatremia, hyperchloremia).  From rehab facility, Na elevated at 158, Cl elevated to 123, and bicarb 18.  Patient received 2L D5W and was placed on 2L [place] by EMS en route.  Patient unable to contribute to history due to baseline and current condition. History obtained from triage note. Patient reportedly A&Ox1 at baseline with rare verbalizations. Patient has had two recent hospitalizations at an OSH for seizure - 10/24/19-6/521 and 09/15/19-09/20/19 - suspected to be secondary to history of CVA and Alzheimer's dementia with breakthrough secondary to accidental glipizide overdose.     Past Medical History:  Diagnosis Date  . Alzheimer's dementia (HCC)   . Anxiety   . Arthritis   . Complication of anesthesia    harder to wake up from Anesthesia  . Family history of adverse reaction to anesthesia    son- Harder to wake up from Anesthesia  . High blood pressure   . High cholesterol   . Sarcoidosis    lungs- no problems for a long time    Patient Active Problem List   Diagnosis Date Noted  . AKI (acute kidney injury) (HCC) 11/28/2019  . Chronic systolic CHF (congestive heart failure) (HCC) 11/28/2019  . Hypernatremia 11/28/2019  . Dehydration 11/28/2019  . Anemia 11/28/2019  . Elevated troponin 11/28/2019  . Other cirrhosis of liver (HCC) 11/28/2019  . Pleural effusion 11/28/2019  . Elevated liver enzymes 11/28/2019  . Elevated lactic acid level 11/28/2019  . Seizure disorder (HCC) 11/28/2019  . Rectal prolapse 04/30/2018  . Rectal mucosa prolapse 11/10/2017  .  Chronic pain of right knee 05/15/2016  . Dementia of the Alzheimer's type without behavioral disturbance (HCC) 10/11/2013  . Gait instability 10/11/2013  . Memory loss 09/06/2012  . Benign paroxysmal positional vertigo 09/06/2012  . High cholesterol   . Anxiety     Past Surgical History:  Procedure Laterality Date  . AURAL ATRESIA REPAIR     1983  . BACK SURGERY     1995  . eye implants     1999  . VAGINAL HYSTERECTOMY     1995  . XI ROBOT ASSISTED RECTOPEXY N/A 04/30/2018   Procedure: XI ROBOT ASSISTED RECTOPEXY AND END COLOSTOMY ERAS PATHWAY;  Surgeon: Romie Levee, MD;  Location: [facility] ORS;  Service: General;  Laterality: N/A;     OB History    Gravida  3   Para  3   Term      Preterm      AB      Living  3     SAB      TAB      Ectopic      Multiple      Live Births              Family History  Problem Relation Age of Onset  . Diabetes Brother   . Leukemia Sister     Social History   Tobacco Use  . Smoking status: Never Smoker  . Smokeless tobacco: Never Used  Vaping Use  . Vaping Use: Never used  Substance Use Topics  .  Alcohol use: No  . Drug use: No    Home Medications Prior to Admission medications   Medication Sig Start Date End Date Taking? Authorizing Provider  aspirin 81 MG chewable tablet Chew 81 mg by mouth daily.   Yes [provider]  atorvastatin (LIPITOR) 40 MG tablet Take 40 mg by mouth daily.   Yes [provider]  levETIRAcetam (KEPPRA) 100 MG/ML solution Take 750 mg by mouth 2 (two) times daily.   Yes [provider]  lisinopril (ZESTRIL) 5 MG tablet Take 5 mg by mouth daily.   Yes [provider]  memantine (NAMENDA) 10 MG tablet Take 10 mg by mouth 2 (two) times daily.   Yes [provider]  metoprolol tartrate (LOPRESSOR) 25 MG tablet Take 12.5 mg by mouth daily.   Yes [provider]  polyethylene glycol (MIRALAX / GLYCOLAX) 17 g packet Take 17 g by mouth daily.    Yes [provider]  potassium chloride SA (K-DUR,KLOR-CON) 20 MEQ tablet Take 20 mEq by mouth every other day.  04/05/18  Yes [provider]  sertraline (ZOLOFT) 100 MG tablet Take 100 mg by mouth daily.  09/16/14  Yes [provider]  Suvorexant (BELSOMRA) 5 MG TABS Take 5 mg by mouth at bedtime.   Yes [provider]  omeprazole (PRILOSEC) 20 MG capsule Take 20 mg by mouth daily. Patient not taking: Reported on 11/28/2019 09/23/13   [provider]    Allergies    Warfarin and related  Review of Systems   Review of Systems  All other systems reviewed and are negative.   Physical Exam Updated Vital Signs BP 131/83 (BP Location: Right Arm)   Pulse 69   Temp (!) 97.5 F (36.4 C) (Oral)   Resp (!) 24   Ht 5' 2.99" (1.6 m)   Wt 57.2 kg   SpO2 99%   BMI 22.34 kg/m   Physical Exam Constitutional:      Comments: Thin woman, lying in bed with covers pulled up, shivering intermittently  HENT:     Head: Normocephalic and atraumatic.     Mouth/Throat:     Mouth: Mucous membranes are dry.     Comments: Food debris appears adherent to teeth/dentures. Eyes:     Pupils: Pupils are equal, round, and reactive to light.  Cardiovascular:     Rate and Rhythm: Normal rate and regular rhythm.     Pulses: Normal pulses.     Heart sounds: Normal heart sounds.  Pulmonary:     Effort: Pulmonary effort is normal.     Comments: Diminished breath sounds at bilateral lung bases Abdominal:     General: Abdomen is flat. Bowel sounds are normal.     Palpations: Abdomen is soft.     Tenderness: There is no guarding.     Comments: Ostomy site clean, dry, and intact. Bag with scant yellow output.  Skin:    General: Skin is dry.     Comments: Cool extremities. Small (<0.5 cm) skin tear just above gluteal cleft.  Neurological:     Comments: Opens eyes spontaneously and to voice. Follows commands to squeeze hands bilaterally and move feet, however does not  raise thumbs.      ED Results / Procedures / Treatments   Labs (all labs ordered are listed, but only abnormal results are displayed) Labs Reviewed  CBC WITH DIFFERENTIAL/PLATELET - Abnormal; Notable for the following components:      Result Value   RBC  3.40 (*)    Hemoglobin 11.1 (*)    HCT 35.5 (*)    MCV 104.4 (*)    RDW 16.4 (*)    Platelets 114 (*)    All other components within normal limits  COMPREHENSIVE METABOLIC PANEL - Abnormal; Notable for the following components:   Sodium 159 (*)    Chloride 126 (*)    Glucose, Bld 133 (*)    BUN 49 (*)    Creatinine, Ser 1.53 (*)    Albumin 3.4 (*)    AST 168 (*)    ALT 166 (*)    Alkaline Phosphatase 253 (*)    GFR calc non Af Amer 30 (*)    GFR calc Af Amer 34 (*)    All other components within normal limits  OSMOLALITY - Abnormal; Notable for the following components:   Osmolality 350 (*)    All other components within normal limits  BRAIN NATRIURETIC PEPTIDE - Abnormal; Notable for the following components:   B Natriuretic Peptide >4,500.0 (*)    All other components within normal limits  BLOOD GAS, VENOUS - Abnormal; Notable for the following components:   pCO2, Ven 40.3 (*)    pO2, Ven <31.0 (*)    Acid-base deficit 2.3 (*)    All other components within normal limits  LACTIC ACID, PLASMA - Abnormal; Notable for the following components:   Lactic Acid, Venous 2.5 (*)    All other components within normal limits  AMMONIA - Abnormal; Notable for the following components:   Ammonia 40 (*)    All other components within normal limits  MAGNESIUM - Abnormal; Notable for the following components:   Magnesium 2.5 (*)    All other components within normal limits  PHOSPHORUS - Abnormal; Notable for the following components:   Phosphorus 6.0 (*)    All other components within normal limits  BASIC METABOLIC PANEL - Abnormal; Notable for the following components:   Sodium 158 (*)    Chloride 125 (*)    Glucose, Bld 114  (*)    BUN 56 (*)    Creatinine, Ser 1.64 (*)    GFR calc non Af Amer 27 (*)    GFR calc Af Amer 32 (*)    All other components within normal limits  LACTIC ACID, PLASMA - Abnormal; Notable for the following components:   Lactic Acid, Venous 2.0 (*)    All other components within normal limits  TROPONIN I (HIGH SENSITIVITY) - Abnormal; Notable for the following components:   Troponin I (High Sensitivity) 151 (*)    All other components within normal limits  TROPONIN I (HIGH SENSITIVITY) - Abnormal; Notable for the following components:   Troponin I (High Sensitivity) 171 (*)    All other components within normal limits  TROPONIN I (HIGH SENSITIVITY) - Abnormal; Notable for the following components:   Troponin I (High Sensitivity) 153 (*)    All other components within normal limits  SARS CORONAVIRUS 2 BY RT PCR (HOSPITAL ORDER, PERFORMED IN [facility])  CK  LACTIC ACID, PLASMA  URINALYSIS, ROUTINE W REFLEX MICROSCOPIC  OSMOLALITY, URINE  SODIUM, URINE, RANDOM  CREATININE, URINE, RANDOM  BASIC METABOLIC PANEL  VITAMIN B12  FOLATE  IRON AND TIBC  FERRITIN  RETICULOCYTES  BASIC METABOLIC PANEL  BASIC METABOLIC PANEL  TROPONIN I (HIGH SENSITIVITY)    EKG EKG Interpretation  Date/Time:  Monday November 28 2019 18:24:47 EDT Ventricular Rate:  79 PR Interval:    QRS  Duration: 143 QT Interval:  429 QTC Calculation: 492 R Axis:   -64 Text Interpretation: Sinus rhythm Right bundle branch block LVH with secondary repolarization abnormality Inferior infarct, old 12 Lead; Mason-Likar No significant change since last tracing Confirmed by Richardean CanalYao, David H [phone]([id number]) on 11/28/2019 6:32:02 PM   Radiology DG Chest Port 1 View  Result Date: 11/28/2019 CLINICAL DATA:  Shortness of breath. EXAM: PORTABLE CHEST 1 VIEW COMPARISON:  None. FINDINGS: Mild atelectasis and/or in the bilateral lower lobes. Small to moderate sized bilateral pleural effusions are seen, right greater than left.  No pneumothorax is identified. The cardiac silhouette is moderately enlarged. The visualized skeletal structures are unremarkable. IMPRESSION: 1. Small to moderate sized bilateral pleural effusions, right greater than left. 2. Mild bilateral lower lobes atelectasis and/or infiltrate. 3. Moderate cardiomegaly. Electronically Signed   By: Aram Candelahaddeus  Houston M.D.   On: 11/28/2019 18:04    Procedures Procedures (including critical care time)  Medications Ordered in ED Medications  dextrose 5 %-0.45 % sodium chloride infusion (has no administration in time range)  sodium chloride 0.9 % bolus 1,000 mL (0 mLs Intravenous Stopped 11/28/19 1834)    ED Course  I have reviewed the triage vital signs and the nursing notes.  Pertinent labs & imaging results that were available during my care of the patient were reviewed by me and considered in my medical decision making (see chart for details).    MDM Rules/Calculators/A&P                          Kelly Cross is a 86 y.o. woman with history of Alzheimer's disease, seizures, chronic systolic heart failure, and hypertension who [facility] for abnormal labs (hypernatremia, hyperchloremia, elevated creatinine).  - Patient vitals stable except for decreased oxygen saturation, though pulse ox with poor waveform. Patient placed on 2L en route, possibly for comfort. She is non-toxic, though dry-appearing. Opens eyes to voice and follows commands but does not vocalize. Diminished lung sounds. Suspect hypernatremia secondary to dehydration given exam and AKI, but cannot rule out infectious source (though no elevated WBC or fever). Will repeat labs, obtain VBG, BNP, urine osm, urinalysis, troponins, and obtain CXR. Will bolus with IVF given clinically dry volume status.  - CBC redemonstrates slight anemia (11.1), thrombocytopenis (114), no leukocytosis. CMP remarkable for hypernatremia to 159 (from 158),  hyperchloremia to 126, glucose of 133, BUN/Cr (49/1.53), hypoalbuminemia (3.4), transaminitis (AST/ALT/AlkPhos) 168/168/253. VBG demonstrates pH 7.36. CXR with small to moderate bilateral pleural effusions R>L, mild bilateral lower lobes atelectasis and/or infiltrate, moderate cardiomegaly. Still low suspicion for infection with no leukocytosis and normothermia.   - Troponin elevated at 151. BNP >4,500. Will stop IVF and diurese w/ IV furosemide. Given patient's tenuous volume status, will discuss admission with hospitalists.   - Patient admitted to hospitalist service for management of hypernatremia and dehydration in setting of chronic heart failure.   Final Clinical Impression(s) / ED Diagnoses Final diagnoses:  Hypernatremia  AKI (acute kidney injury) (HCC)  Dehydration    Rx / DC Orders ED Discharge Orders    None       Alphonzo SeveranceWatson, Beck Cofer, MD 11/29/19 0102    Charlynne PanderYao, David Hsienta, MD 12/02/19 2010

## 2019-11-29 LAB — URINALYSIS, ROUTINE W REFLEX MICROSCOPIC
Bilirubin Urine: NEGATIVE
Glucose, UA: NEGATIVE mg/dL
Hgb urine dipstick: NEGATIVE
Ketones, ur: NEGATIVE mg/dL
Nitrite: NEGATIVE
Protein, ur: 30 mg/dL — AB
Specific Gravity, Urine: 1.02 (ref 1.005–1.030)
pH: 5 (ref 5.0–8.0)

## 2019-11-29 LAB — COMPREHENSIVE METABOLIC PANEL
ALT: 156 U/L — ABNORMAL HIGH (ref 0–44)
ALT: 132 U/L — ABNORMAL HIGH (ref 0–44)
AST: 148 U/L — ABNORMAL HIGH (ref 15–41)
AST: 119 U/L — ABNORMAL HIGH (ref 15–41)
Albumin: 3.4 g/dL — ABNORMAL LOW (ref 3.5–5.0)
Albumin: 3 g/dL — ABNORMAL LOW (ref 3.5–5.0)
Alkaline Phosphatase: 244 U/L — ABNORMAL HIGH (ref 38–126)
Alkaline Phosphatase: 225 U/L — ABNORMAL HIGH (ref 38–126)
Anion gap: 14 (ref 5–15)
Anion gap: 7 (ref 5–15)
BUN: 53 mg/dL — ABNORMAL HIGH (ref 8–23)
BUN: 51 mg/dL — ABNORMAL HIGH (ref 8–23)
CO2: 22 mmol/L (ref 22–32)
CO2: 24 mmol/L (ref 22–32)
Calcium: 9 mg/dL (ref 8.9–10.3)
Calcium: 8.7 mg/dL — ABNORMAL LOW (ref 8.9–10.3)
Chloride: 125 mmol/L — ABNORMAL HIGH (ref 98–111)
Chloride: 129 mmol/L — ABNORMAL HIGH (ref 98–111)
Creatinine, Ser: 1.54 mg/dL — ABNORMAL HIGH (ref 0.44–1.00)
Creatinine, Ser: 1.54 mg/dL — ABNORMAL HIGH (ref 0.44–1.00)
GFR calc Af Amer: 34 mL/min — ABNORMAL LOW (ref >60)
GFR calc Af Amer: 34 mL/min — ABNORMAL LOW (ref >60)
GFR calc non Af Amer: 29 mL/min — ABNORMAL LOW (ref >60)
GFR calc non Af Amer: 29 mL/min — ABNORMAL LOW (ref >60)
Glucose, Bld: 122 mg/dL — ABNORMAL HIGH (ref 70–99)
Glucose, Bld: 110 mg/dL — ABNORMAL HIGH (ref 70–99)
Potassium: 3.9 mmol/L (ref 3.5–5.1)
Potassium: 4.1 mmol/L (ref 3.5–5.1)
Sodium: 161 mmol/L (ref 135–145)
Sodium: 160 mmol/L — ABNORMAL HIGH (ref 135–145)
Total Bilirubin: 1.2 mg/dL (ref 0.3–1.2)
Total Bilirubin: 0.9 mg/dL (ref 0.3–1.2)
Total Protein: 6.6 g/dL (ref 6.5–8.1)
Total Protein: 6.2 g/dL — ABNORMAL LOW (ref 6.5–8.1)

## 2019-11-29 LAB — BASIC METABOLIC PANEL
Anion gap: 11 (ref 5–15)
Anion gap: 12 (ref 5–15)
Anion gap: 8 (ref 5–15)
BUN: 53 mg/dL — ABNORMAL HIGH (ref 8–23)
BUN: 54 mg/dL — ABNORMAL HIGH (ref 8–23)
BUN: 56 mg/dL — ABNORMAL HIGH (ref 8–23)
CO2: 20 mmol/L — ABNORMAL LOW (ref 22–32)
CO2: 21 mmol/L — ABNORMAL LOW (ref 22–32)
CO2: 22 mmol/L (ref 22–32)
Calcium: 8.3 mg/dL — ABNORMAL LOW (ref 8.9–10.3)
Calcium: 8.6 mg/dL — ABNORMAL LOW (ref 8.9–10.3)
Calcium: 8.9 mg/dL (ref 8.9–10.3)
Chloride: 125 mmol/L — ABNORMAL HIGH (ref 98–111)
Chloride: 127 mmol/L — ABNORMAL HIGH (ref 98–111)
Chloride: 128 mmol/L — ABNORMAL HIGH (ref 98–111)
Creatinine, Ser: 1.45 mg/dL — ABNORMAL HIGH (ref 0.44–1.00)
Creatinine, Ser: 1.48 mg/dL — ABNORMAL HIGH (ref 0.44–1.00)
Creatinine, Ser: 1.64 mg/dL — ABNORMAL HIGH (ref 0.44–1.00)
GFR calc Af Amer: 32 mL/min — ABNORMAL LOW (ref >60)
GFR calc Af Amer: 36 mL/min — ABNORMAL LOW (ref >60)
GFR calc Af Amer: 37 mL/min — ABNORMAL LOW (ref >60)
GFR calc non Af Amer: 27 mL/min — ABNORMAL LOW (ref >60)
GFR calc non Af Amer: 31 mL/min — ABNORMAL LOW (ref >60)
GFR calc non Af Amer: 32 mL/min — ABNORMAL LOW (ref >60)
Glucose, Bld: 114 mg/dL — ABNORMAL HIGH (ref 70–99)
Glucose, Bld: 127 mg/dL — ABNORMAL HIGH (ref 70–99)
Glucose, Bld: 156 mg/dL — ABNORMAL HIGH (ref 70–99)
Potassium: 3.5 mmol/L (ref 3.5–5.1)
Potassium: 4.1 mmol/L (ref 3.5–5.1)
Potassium: 4.6 mmol/L (ref 3.5–5.1)
Sodium: 157 mmol/L — ABNORMAL HIGH (ref 135–145)
Sodium: 158 mmol/L — ABNORMAL HIGH (ref 135–145)
Sodium: 159 mmol/L — ABNORMAL HIGH (ref 135–145)

## 2019-11-29 LAB — FERRITIN: Ferritin: 52 ng/mL (ref 11–307)

## 2019-11-29 LAB — CBC WITH DIFFERENTIAL/PLATELET
Abs Immature Granulocytes: 0.03 10*3/uL (ref 0.00–0.07)
Basophils Absolute: 0 10*3/uL (ref 0.0–0.1)
Basophils Relative: 0 %
Eosinophils Absolute: 0 10*3/uL (ref 0.0–0.5)
Eosinophils Relative: 0 %
HCT: 34.6 % — ABNORMAL LOW (ref 36.0–46.0)
Hemoglobin: 11 g/dL — ABNORMAL LOW (ref 12.0–15.0)
Immature Granulocytes: 0 %
Lymphocytes Relative: 17 %
Lymphs Abs: 1.5 10*3/uL (ref 0.7–4.0)
MCH: 34.1 pg — ABNORMAL HIGH (ref 26.0–34.0)
MCHC: 31.8 g/dL (ref 30.0–36.0)
MCV: 107.1 fL — ABNORMAL HIGH (ref 80.0–100.0)
Monocytes Absolute: 0.7 10*3/uL (ref 0.1–1.0)
Monocytes Relative: 8 %
Neutro Abs: 6.8 10*3/uL (ref 1.7–7.7)
Neutrophils Relative %: 75 %
Platelets: 100 10*3/uL — ABNORMAL LOW (ref 150–400)
RBC: 3.23 MIL/uL — ABNORMAL LOW (ref 3.87–5.11)
RDW: 16.4 % — ABNORMAL HIGH (ref 11.5–15.5)
WBC: 9.1 10*3/uL (ref 4.0–10.5)
nRBC: 0 % (ref 0.0–0.2)

## 2019-11-29 LAB — IRON AND TIBC
Iron: 52 ug/dL (ref 28–170)
Saturation Ratios: 17 % (ref 10.4–31.8)
TIBC: 299 ug/dL (ref 250–450)
UIBC: 247 ug/dL

## 2019-11-29 LAB — PHOSPHORUS: Phosphorus: 5.8 mg/dL — ABNORMAL HIGH (ref 2.5–4.6)

## 2019-11-29 LAB — TROPONIN I (HIGH SENSITIVITY)
Troponin I (High Sensitivity): 147 ng/L (ref <18)
Troponin I (High Sensitivity): 153 ng/L (ref <18)

## 2019-11-29 LAB — FOLATE: Folate: 26.4 ng/mL (ref >5.9)

## 2019-11-29 LAB — TSH: TSH: 0.401 u[IU]/mL (ref 0.350–4.500)

## 2019-11-29 LAB — MAGNESIUM: Magnesium: 2.6 mg/dL — ABNORMAL HIGH (ref 1.7–2.4)

## 2019-11-29 LAB — RETICULOCYTES
Immature Retic Fract: 26.9 % — ABNORMAL HIGH (ref 2.3–15.9)
RBC.: 3.35 MIL/uL — ABNORMAL LOW (ref 3.87–5.11)
Retic Count, Absolute: 81.1 10*3/uL (ref 19.0–186.0)
Retic Ct Pct: 2.4 % (ref 0.4–3.1)

## 2019-11-29 LAB — VITAMIN B12: Vitamin B-12: 1414 pg/mL — ABNORMAL HIGH (ref 180–914)

## 2019-11-29 LAB — CREATININE, URINE, RANDOM: Creatinine, Urine: 154.21 mg/dL

## 2019-11-29 LAB — SODIUM, URINE, RANDOM: Sodium, Ur: <10 mmol/L

## 2019-11-29 LAB — LACTIC ACID, PLASMA: Lactic Acid, Venous: 2 mmol/L (ref 0.5–1.9)

## 2019-11-29 LAB — OSMOLALITY, URINE: Osmolality, Ur: 708 mOsm/kg (ref 300–900)

## 2019-11-29 MED ORDER — ALBUTEROL SULFATE (2.5 MG/3ML) 0.083% IN NEBU: 2.5000 mg | INHALATION_SOLUTION | RESPIRATORY_TRACT | Status: DC | PRN

## 2019-11-29 MED ORDER — MEMANTINE HCL 10 MG PO TABS
10.0000 mg | ORAL_TABLET | Freq: Two times a day (BID) | ORAL | Status: DC
  Filled 2019-11-29: qty 1

## 2019-11-29 MED ORDER — METOPROLOL TARTRATE 25 MG PO TABS
12.5000 mg | ORAL_TABLET | Freq: Every day | ORAL | Status: DC
  Administered 2019-11-29 – 2019-12-05 (×7): 12.5 mg via ORAL
  Filled 2019-11-29 (×7): qty 1

## 2019-11-29 MED ORDER — ADULT MULTIVITAMIN LIQUID CH
15.0000 mL | Freq: Every day | ORAL | Status: DC
  Administered 2019-11-29 – 2019-12-05 (×7): 15 mL via ORAL
  Filled 2019-11-29 (×7): qty 15

## 2019-11-29 MED ORDER — LEVETIRACETAM 100 MG/ML PO SOLN
750.0000 mg | Freq: Two times a day (BID) | ORAL | Status: DC
  Administered 2019-11-29 – 2019-12-05 (×13): 750 mg via ORAL
  Filled 2019-11-29 (×15): qty 7.5

## 2019-11-29 MED ORDER — DOCUSATE SODIUM 100 MG PO CAPS
100.0000 mg | ORAL_CAPSULE | Freq: Two times a day (BID) | ORAL | Status: DC
  Filled 2019-11-29: qty 1

## 2019-11-29 MED ORDER — DEXTROSE-NACL 5-0.45 % IV SOLN: INTRAVENOUS | Status: DC

## 2019-11-29 MED ORDER — ASPIRIN 81 MG PO CHEW
81.0000 mg | CHEWABLE_TABLET | Freq: Every day | ORAL | Status: DC
  Administered 2019-11-29 – 2019-12-05 (×7): 81 mg via ORAL
  Filled 2019-11-29 (×7): qty 1

## 2019-11-29 MED ORDER — ACETAMINOPHEN 650 MG RE SUPP: 650.0000 mg | Freq: Four times a day (QID) | RECTAL | Status: DC | PRN

## 2019-11-29 MED ORDER — SODIUM CHLORIDE 0.9% FLUSH
3.0000 mL | Freq: Two times a day (BID) | INTRAVENOUS | Status: DC
  Administered 2019-11-29 – 2019-12-03 (×6): 3 mL via INTRAVENOUS

## 2019-11-29 MED ORDER — SODIUM CHLORIDE 0.9 % IV SOLN
750.0000 mg | Freq: Two times a day (BID) | INTRAVENOUS | Status: AC
  Administered 2019-11-29: 750 mg via INTRAVENOUS
  Filled 2019-11-29: qty 7.5

## 2019-11-29 MED ORDER — ATORVASTATIN CALCIUM 40 MG PO TABS
40.0000 mg | ORAL_TABLET | Freq: Every day | ORAL | Status: DC
  Administered 2019-11-29 – 2019-12-05 (×7): 40 mg via ORAL
  Filled 2019-11-29 (×7): qty 1

## 2019-11-29 MED ORDER — RESOURCE THICKENUP CLEAR PO POWD
ORAL | Status: DC | PRN
  Filled 2019-11-29 (×2): qty 125

## 2019-11-29 MED ORDER — DEXTROSE IN LACTATED RINGERS 5 % IV SOLN: INTRAVENOUS | Status: DC

## 2019-11-29 MED ORDER — MORPHINE SULFATE (PF) 2 MG/ML IV SOLN: 2.0000 mg | INTRAVENOUS | Status: DC | PRN

## 2019-11-29 MED ORDER — ACETAMINOPHEN 325 MG PO TABS
650.0000 mg | ORAL_TABLET | Freq: Four times a day (QID) | ORAL | Status: DC | PRN
  Administered 2019-12-04: 650 mg via ORAL
  Filled 2019-11-29: qty 2

## 2019-11-29 MED ORDER — ONDANSETRON HCL 4 MG/2ML IJ SOLN: 4.0000 mg | Freq: Four times a day (QID) | INTRAMUSCULAR | Status: DC | PRN

## 2019-11-29 MED ORDER — ONDANSETRON HCL 4 MG PO TABS: 4.0000 mg | ORAL_TABLET | Freq: Four times a day (QID) | ORAL | Status: DC | PRN

## 2019-11-29 MED ORDER — ASPIRIN EC 81 MG PO TBEC: 81.0000 mg | DELAYED_RELEASE_TABLET | Freq: Every day | ORAL | Status: DC

## 2019-11-29 NOTE — Evaluation (Addendum)
Physical Therapy Evaluation Patient Details Name: Kelly Cross MRN: [id number] DOB: 09/10/1926 Today's Date: 11/29/2019   History of Present Illness  86 y.o. female with medical history significant of dementia, hypertension, HLD, sarcoidosis with no pulmonary involvement, recent seizure, osteoporosis, stroke, cirrhosis and admitted for dehydration hypernatremia and AKI with concomitant bilateral pleural effusions  Clinical Impression  Pt admitted with above diagnosis.  Pt currently with functional limitations due to the deficits listed below (see PT Problem List). Pt will benefit from skilled PT to increase their independence and safety with mobility to allow discharge to the venue listed below.  Pt attempting to follow simple commands however requires assist for mobility at this time.  Pt assisted to standing with +2 mod assist however unable to shift weight or take steps.  From [facility] per chart however no baseline mobility level reported and pt poor historian.     Follow Up Recommendations Supervision/Assistance - 24 hour;SNF    Equipment Recommendations  None recommended by PT    Recommendations for Other Services       Precautions / Restrictions Precautions Precautions: Fall Precaution Comments: colostomy      Mobility  Bed Mobility Overal bed mobility: Needs Assistance Bed Mobility: Supine to Sit;Sit to Supine     Supine to sit: Max assist Sit to supine: Mod assist   General bed mobility comments: assist for trunk upright and scooting to EOB, assist for lowering trunk to return to bed  Transfers Overall transfer level: Needs assistance Equipment used: 2 person hand held assist Transfers: Sit to/from Stand Sit to Stand: Mod assist;+2 physical assistance         General transfer comment: assist to rise and steady, pt not able to achieve full knee extension, able to stand approx 1 min however not able to shift weight to take steps  Ambulation/Gait                 Stairs            Wheelchair Mobility    Modified Rankin (Stroke Patients Only)       Balance Overall balance assessment: Needs assistance Sitting-balance support: Bilateral upper extremity supported;Feet supported Sitting balance-Leahy Scale: Poor Sitting balance - Comments: pt uses bil UE support to self assist   Standing balance support: Bilateral upper extremity supported Standing balance-Leahy Scale: Zero                               Pertinent Vitals/Pain Pain Assessment: Faces Faces Pain Scale: No hurt Pain Intervention(s): Repositioned;Monitored during session    Home Living Family/patient expects to be discharged to:: Skilled nursing facility                      Prior Function Level of Independence: Needs assistance   Gait / Transfers Assistance Needed: per chart review, pt with admission in 04/2018 and was ambulating with RW, likely only transfers to w/c with assist at this time however pt poor historian  ADL's / Homemaking Assistance Needed: likely total assist        Hand Dominance        Extremity/Trunk Assessment   Upper Extremity Assessment Upper Extremity Assessment: Generalized weakness    Lower Extremity Assessment Lower Extremity Assessment: Generalized weakness (keeps LEs in flexion at rest)       Communication   Communication: Other (comment) (difficulty to assess as pt makes little verbalizations)  Cognition  Arousal/Alertness: Awake/alert Behavior During Therapy: Flat affect Overall Cognitive Status: History of cognitive impairments - at baseline                                 General Comments: per chart, little verbalizations at baseline      General Comments      Exercises     Assessment/Plan    PT Assessment    PT Problem List         PT Treatment Interventions      PT Goals (Current goals can be found in the Care Plan section)  Acute Rehab PT Goals PT  Goal Formulation: Patient unable to participate in goal setting Time For Goal Achievement: 12/13/19 Potential to Achieve Goals: Fair    Frequency     Barriers to discharge        Co-evaluation               AM-PAC PT "6 Clicks" Mobility  Outcome Measure Help needed turning from your back to your side while in a flat bed without using bedrails?: A Lot Help needed moving from lying on your back to sitting on the side of a flat bed without using bedrails?: A Lot Help needed moving to and from a bed to a chair (including a wheelchair)?: A Lot Help needed standing up from a chair using your arms (e.g., wheelchair or bedside chair)?: A Lot Help needed to walk in hospital room?: Total Help needed climbing 3-5 steps with a railing? : Total 6 Click Score: 10    End of Session   Activity Tolerance: Patient tolerated treatment well Patient left: in bed;with call bell/phone within reach Nurse Communication: Mobility status PT Visit Diagnosis: Other abnormalities of gait and mobility (R26.89);Muscle weakness (generalized) (M62.81)    Time: 8756-4332 PT Time Calculation (min) (ACUTE ONLY): 11 min   Charges:   PT Evaluation $PT Eval Low Complexity: 1 Low     Kati PT, DPT Acute Rehabilitation Services Pager: [phone] Office: [phone]  Maida Sale E 11/29/2019, 12:30 PM

## 2019-11-29 NOTE — Progress Notes (Signed)
Initial Nutrition Assessment  DOCUMENTATION CODES:   Not applicable  INTERVENTION:  - will order Magic Cup TID with meals, each supplement provides 290 kcal and 9 grams of protein. - will order 15 ml multivitamin/day. - will monitor for recommendations from SLP.  NUTRITION DIAGNOSIS:   Inadequate oral intake related to lethargy/confusion as evidenced by meal completion < 25%.  GOAL:   Patient will meet greater than or equal to 90% of their needs  MONITOR:   PO intake, Supplement acceptance, Labs, Weight trends  REASON FOR ASSESSMENT:   Consult Assessment of nutrition requirement/status  ASSESSMENT:   86 y.o. female with medical history of dementia, HTN, HLD, sarcoidosis with no pulmonary involvement, recent seizure, osteoporosis, stroke, advanced dementia with minimal verbalization, and cirrhosis. She presented from [facility] due to decreased PO intake for several days, increased lethargy, and hypernatremia (serum Na: 158).  No intakes documented. Patient was non-verbal during RD visit and no family/visitors present.  Able to talk with RN at bedside who reports plan for SLP evaluation d/t holding items in mouth/pocketing and no chewing.   RN gave patient a Magic Cup this AM and she ate nearly all of it. RN plans to try mashed potatoes and possibly ice cream mixed in Ensure.   Per chart review, weight yesterday as 126 lb and weight on 6/3 at [facility] was 118 lb.    Labs reviewed; Na: 157 mmol/l, Cl: 128 mmol/l, BUN: 53 mg/dl, creatinine: 7.62 mg/dl, Ca: 8.6 mg/dl, GFR: 37 ml/min.  Medications reviewed.  IVF; D5-LR @ 75 ml/hr.    NUTRITION - FOCUSED PHYSICAL EXAM:    Most Recent Value  Orbital Region No depletion  Upper Arm Region Mild depletion  Thoracic and Lumbar Region Unable to assess  Buccal Region No depletion  Temple Region No depletion  Clavicle Bone Region Mild depletion  Clavicle and Acromion Bone Region Mild depletion  Scapular Bone Region Unable to  assess  Dorsal Hand Unable to assess  Patellar Region Unable to assess  Anterior Thigh Region Unable to assess  Posterior Calf Region Unable to assess  Edema (RD Assessment) Unable to assess  Hair Reviewed  Eyes Reviewed  Mouth Unable to assess  Skin Reviewed  Nails Unable to assess       Diet Order:   Diet Order            DIET DYS 3 Room service appropriate? No; Fluid consistency: Thin  Diet effective now                 EDUCATION NEEDS:   Not appropriate for education at this time  Skin:  Skin Assessment: Reviewed RN Assessment  Last BM:  PTA/unknown  Height:   Ht Readings from Last 1 Encounters:  11/28/19 5' 2.99" (1.6 m)    Weight:   Wt Readings from Last 1 Encounters:  11/28/19 57.2 kg    Estimated Nutritional Needs:  Kcal:  1200-1400 kcal Protein:  50-60 grams Fluid:  >/= 1.7 L/day     Trenton Gammon, MS, RD, LDN, CNSC Inpatient Clinical Dietitian RD pager # available in [facility]  After hours/weekend pager # available in [facility]

## 2019-11-29 NOTE — Consult Note (Addendum)
WOC Nurse ostomy consult note Stoma type/location:  Pt had colostomy surgery performed in 2019, according to progress notes.  She lives in a facility where she receives total assistance with ostomy pouching activities.  She does not verbalize or participate in pouch change.  Stomal assessment/size: Stoma is red and viable, 1 1/4 inches, above skin level Peristomal assessment: intact skin surrounding Output: no stool in the pouch at this time Ostomy pouching: 1pc.  Education provided:  Applied barrier ring and one piece pouch. Extra barrier rings and pouches left in the room for staff nurse use.  Please re-consult if further assistance is needed.  Thank-you,  Cammie Mcgee MSN, RN, CWOCN, [place], CNS [phone]

## 2019-11-29 NOTE — Evaluation (Signed)
Occupational Therapy Evaluation Patient Details Name: Kelly Cross MRN: [id number] DOB: 25-Aug-1926 Today's Date: 11/29/2019    History of Present Illness 86 y.o. female with medical history significant of dementia, hypertension, HLD, sarcoidosis with no pulmonary involvement, recent seizure, osteoporosis, stroke, cirrhosis and admitted for dehydration hypernatremia and AKI with concomitant bilateral pleural effusions   Clinical Impression   Pt admitted with the avove. Pt currently with functional limitations due to the deficits listed below (see OT Problem List).  Pt will benefit from skilled OT to increase their safety and independence with ADL and functional mobility for ADL to facilitate discharge to venue listed below.   Plan is back to SNF.  OT session focused on self feeding and grooming. Pt with significant coughing after eating magic cup.  RN aware     Follow Up Recommendations  SNF    Equipment Recommendations  None recommended by OT    Recommendations for Other Services       Precautions / Restrictions Precautions Precautions: Fall Precaution Comments: colostomy      Mobility Bed Mobility Overal bed mobility: Needs Assistance Bed Mobility: Rolling Rolling: Max assist   Supine to sit: Max assist Sit to supine: Mod assist   General bed mobility comments: assist for trunk upright and scooting to EOB, assist for lowering trunk to return to bed  Transfers Overall transfer level: Needs assistance Equipment used: 2 person hand held assist Transfers: Sit to/from Stand Sit to Stand: Mod assist;+2 physical assistance         General transfer comment: assist to rise and steady, pt not able to achieve full knee extension, able to stand approx 1 min however not able to shift weight to take steps    Balance Overall balance assessment: Needs assistance Sitting-balance support: Bilateral upper extremity supported;Feet supported Sitting balance-Leahy Scale:  Poor Sitting balance - Comments: pt uses bil UE support to self assist   Standing balance support: Bilateral upper extremity supported Standing balance-Leahy Scale: Zero                             ADL either performed or assessed with clinical judgement   ADL Overall ADL's : Needs assistance/impaired Eating/Feeding: Moderate assistance;Bed level Eating/Feeding Details (indicate cue type and reason): HOB raised Grooming: Wash/dry hands;Wash/dry face;Bed level;Moderate assistance Grooming Details (indicate cue type and reason): HOB raised                               General ADL Comments: pt with significant cough after having magic cup.  OT did comunicate with SLP as well as RN regarding this                  Pertinent Vitals/Pain Pain Assessment: No/denies pain Faces Pain Scale: No hurt Pain Intervention(s): Repositioned;Monitored during session     Hand Dominance     Extremity/Trunk Assessment Upper Extremity Assessment Upper Extremity Assessment: Generalized weakness   Lower Extremity Assessment Lower Extremity Assessment: Generalized weakness (keeps LEs in flexion at rest)       Communication Communication Communication: Other (comment) (difficulty to assess as pt makes little verbalizations)   Cognition Arousal/Alertness: Awake/alert Behavior During Therapy: Flat affect Overall Cognitive Status: History of cognitive impairments - at baseline  General Comments: per chart, little verbalizations at baseline   General Comments               Home Living Family/patient expects to be discharged to:: Skilled nursing facility                                        Prior Functioning/Environment Level of Independence: Needs assistance  Gait / Transfers Assistance Needed: per chart review, pt with admission in 04/2018 and was ambulating with RW, likely only transfers to w/c  with assist at this time however pt poor historian ADL's / Homemaking Assistance Needed: likely total assist            OT Problem List: Decreased strength;Impaired balance (sitting and/or standing);Decreased activity tolerance      OT Treatment/Interventions: Self-care/ADL training;Patient/family education;Therapeutic activities    OT Goals(Current goals can be found in the care plan section) Acute Rehab OT Goals Patient Stated Goal: did not state OT Goal Formulation: With patient Time For Goal Achievement: 12/06/19 Potential to Achieve Goals: Good ADL Goals Pt Will Perform Eating: with min assist;sitting Pt Will Perform Grooming: with min assist;sitting  OT Frequency: Min 2X/week    AM-PAC OT "6 Clicks" Daily Activity     Outcome Measure Help from another person eating meals?: A Lot Help from another person taking care of personal grooming?: A Lot Help from another person toileting, which includes using toliet, bedpan, or urinal?: Total Help from another person bathing (including washing, rinsing, drying)?: A Lot Help from another person to put on and taking off regular upper body clothing?: A Lot Help from another person to put on and taking off regular lower body clothing?: Total 6 Click Score: 10   End of Session Nurse Communication: Mobility status;Other (comment) (coughing with eating magic cup)  Activity Tolerance: Patient limited by lethargy Patient left: in bed;with call bell/phone within reach  OT Visit Diagnosis: Unsteadiness on feet (R26.81);Muscle weakness (generalized) (M62.81)                Time: 1353-1410 OT Time Calculation (min): 17 min Charges:  OT General Charges $OT Visit: 1 Visit OT Evaluation $OT Eval Moderate Complexity: 1 Mod  Lise Auer, OT Acute Rehabilitation Services [phone] Office- [phone]     [name], [name] 11/29/2019, 2:40 PM

## 2019-11-29 NOTE — Discharge Summary (Addendum)
PROGRESS NOTE    Kelly Cross  UYQ:[id number] DOB: 1926/12/12 DOA: 11/28/2019 PCP: Lorenda Ishihara, MD  Brief Narrative:  89 black female resident of [facility] Moderate to severe Alzheimer's disease-last documented MMSE back in 2000 1823/30 by neurology at office of Dr. Debarah Crape HTN Pulmonary sarcoid Possible cirrhosis in the past Restless leg syndrome Rectal prolapse status post rectopexy + colostomy creation 2019  Hospitalized 4/22-->4/27 tonic-clonic seizures-loaded with Keppra-that admission found to have EF 35%-was not a good candidate for LHC secondary to strokes found at the time given a Zio patch on discharge also found coincidental inferior parotid gland lesion 11 mm-she was made DNR at the time and her legal guardian is Ms. Turner  Came to [facility] 7/6 secondary to lethargy Found to have sodium 158 chloride 123 CO2 18  Assessment & Plan:   Active Problems:   High cholesterol   Anxiety   Dementia of the Alzheimer's type without behavioral disturbance (HCC)   AKI (acute kidney injury) (HCC)   Chronic systolic CHF (congestive heart failure) (HCC)   Hypernatremia   Dehydration   Anemia   Elevated troponin   Other cirrhosis of liver (HCC)   Pleural effusion   Elevated liver enzymes   Elevated lactic acid level   Seizure disorder (HCC)   1. Acute kidney injury 2. Volume depletion leading to free water loss and severe hypernatremia a. Free water deficit = 3.5 L b. D5 at 50 cc an hour we will correct this deficit safely without risk of pontine myelinolysis in about 70 hours c. Expect this will help with mentation and metabolic encephalopathy 3. Toxic metabolic encephalopathy secondary to severe hyponatremia and mild AKI a. Hold any medications with potential for antihistamine anticholinergic or opiate b. Monitor 4. EF 35% systolic heart failure status post Zio patch + EF 35% a. Careful resuscitation as above with fluid b. Expect patient  will have some element of effusion given EF of 35% c. Would not diurese at this time 5. Pleural effusions likely transudate of from heart failure and cirrhosis a. May require addition of low-dose Aldactone to Lasix ratio 5:2 once patient is more euvolemic and coherent would not diurese at this time b. Will order 2 view chest x-ray for a.m. 7/7 to better delineate issues c. In a patient with this advanced dementia would expect she is at risk for aspiration d. If chest x-ray shows concerns for pneumonia would empirically start antibiotics 6. Anxiety 7. Prior rectal prolapse status post colostomy creation 2019 8. Chronic anemia  9. history of pulmonary sarcoid  DVT prophylaxis:  Code Status: DNR confirmed Family Communication: called legal gaurdian rykiel Francis Dowse is with [phone] No asnwer Disposition:   Status is: Inpatient  Remains inpatient appropriate because:Persistent severe electrolyte disturbances and IV treatments appropriate due to intensity of illness or inability to take PO   Dispo: The patient is from: Home              Anticipated d/c is to: SNF              Anticipated d/c date is: > 3 days              Patient currently is not medically stable to d/c.  Consultants:   pallaitve  Procedures: n  Antimicrobials: n    Subjective: Not obtain review of systems  Objective: Vitals:   11/28/19 2210 11/29/19 0215 11/29/19 0612 11/29/19 0859  BP:  125/62 123/77 125/83  Pulse:  72  72 78  Resp:  20 20 18   Temp:  (!) 97.5 F (36.4 C) (!) 97.4 F (36.3 C) 97.6 F (36.4 C)  TempSrc:  Oral Oral Oral  SpO2:  100% 97% 99%  Weight: 57.2 kg     Height: 5' 2.99" (1.6 m)       Intake/Output Summary (Last 24 hours) at 11/29/2019 1235 Last data filed at 11/29/2019 0600 Gross per 24 hour  Intake 558.27 ml  Output --  Net 558.27 ml   Filed Weights   11/28/19 2210  Weight: 57.2 kg    Examination:  lethargic blank stare thick neck Mallampati 3 S1-S2 no  murmur Abdomen soft No rales rhonchi Lower extremities are not swollen Does not cooperate with exam but moves limbs meaningfully at times  Data Reviewed: I have personally reviewed following labs and imaging studies Sodium 161-->157 BUN/creatinine 53/1.4 Hemoglobin 11.0   Radiology Studies: DG Chest Port 1 View  Result Date: 11/28/2019 CLINICAL DATA:  Shortness of breath. EXAM: PORTABLE CHEST 1 VIEW COMPARISON:  None. FINDINGS: Mild atelectasis and/or in the bilateral lower lobes. Small to moderate sized bilateral pleural effusions are seen, right greater than left. No pneumothorax is identified. The cardiac silhouette is moderately enlarged. The visualized skeletal structures are unremarkable. IMPRESSION: 1. Small to moderate sized bilateral pleural effusions, right greater than left. 2. Mild bilateral lower lobes atelectasis and/or infiltrate. 3. Moderate cardiomegaly. Electronically Signed   By: 01/29/2020 M.D.   On: 11/28/2019 18:04     Scheduled Meds: . aspirin  81 mg Oral Daily  . atorvastatin  40 mg Oral Daily  . levETIRAcetam  750 mg Oral BID  . metoprolol tartrate  12.5 mg Oral Daily  . multivitamin  15 mL Oral Daily  . sodium chloride flush  3 mL Intravenous Q12H   Continuous Infusions: . dextrose 5% lactated ringers       LOS: 1 day    Time spent: 40  01/29/2020, MD [facility] To contact the attending provider between 7A-7P or the covering provider during after hours 7P-7A, please log into the web site [email or web address] and access using universal [place] password for that web site. If you do not have the password, please call the hospital operator.  11/29/2019, 12:35 PM

## 2019-11-29 NOTE — Plan of Care (Signed)
  Problem: Clinical Measurements: Goal: Complications related to the disease process or treatment will be avoided or minimized Outcome: Progressing Goal: Dialysis access will remain free of complications Outcome: Progressing   Problem: Activity: Goal: Activity intolerance will improve Outcome: Progressing   Problem: Fluid Volume: Goal: Fluid volume balance will be maintained or improved Outcome: Progressing   Problem: Nutritional: Goal: Ability to make appropriate dietary choices will improve Outcome: Progressing

## 2019-11-30 ENCOUNTER — Inpatient Hospital Stay (HOSPITAL_COMMUNITY): Payer: Medicare PPO

## 2019-11-30 DIAGNOSIS — R531 Weakness: Secondary | ICD-10-CM

## 2019-11-30 DIAGNOSIS — Z7189 Other specified counseling: Secondary | ICD-10-CM

## 2019-11-30 DIAGNOSIS — Z515 Encounter for palliative care: Secondary | ICD-10-CM

## 2019-11-30 LAB — CBC WITH DIFFERENTIAL/PLATELET
Abs Immature Granulocytes: 0.04 10*3/uL (ref 0.00–0.07)
Abs Immature Granulocytes: 0.03 10*3/uL (ref 0.00–0.07)
Basophils Absolute: 0 10*3/uL (ref 0.0–0.1)
Basophils Absolute: 0.1 10*3/uL (ref 0.0–0.1)
Basophils Relative: 0 %
Basophils Relative: 1 %
Eosinophils Absolute: 0.1 10*3/uL (ref 0.0–0.5)
Eosinophils Absolute: 0.2 10*3/uL (ref 0.0–0.5)
Eosinophils Relative: 1 %
Eosinophils Relative: 2 %
HCT: 36.2 % (ref 36.0–46.0)
HCT: 37.8 % (ref 36.0–46.0)
Hemoglobin: 11.2 g/dL — ABNORMAL LOW (ref 12.0–15.0)
Hemoglobin: 11.8 g/dL — ABNORMAL LOW (ref 12.0–15.0)
Immature Granulocytes: 0 %
Immature Granulocytes: 0 %
Lymphocytes Relative: 18 %
Lymphocytes Relative: 21 %
Lymphs Abs: 1.6 10*3/uL (ref 0.7–4.0)
Lymphs Abs: 1.8 10*3/uL (ref 0.7–4.0)
MCH: 33.2 pg (ref 26.0–34.0)
MCH: 33.7 pg (ref 26.0–34.0)
MCHC: 30.9 g/dL (ref 30.0–36.0)
MCHC: 31.2 g/dL (ref 30.0–36.0)
MCV: 107.4 fL — ABNORMAL HIGH (ref 80.0–100.0)
MCV: 108 fL — ABNORMAL HIGH (ref 80.0–100.0)
Monocytes Absolute: 0.8 10*3/uL (ref 0.1–1.0)
Monocytes Absolute: 0.7 10*3/uL (ref 0.1–1.0)
Monocytes Relative: 9 %
Monocytes Relative: 8 %
Neutro Abs: 6.3 10*3/uL (ref 1.7–7.7)
Neutro Abs: 5.9 10*3/uL (ref 1.7–7.7)
Neutrophils Relative %: 72 %
Neutrophils Relative %: 68 %
Platelets: 96 10*3/uL — ABNORMAL LOW (ref 150–400)
Platelets: 94 10*3/uL — ABNORMAL LOW (ref 150–400)
RBC: 3.37 MIL/uL — ABNORMAL LOW (ref 3.87–5.11)
RBC: 3.5 MIL/uL — ABNORMAL LOW (ref 3.87–5.11)
RDW: 16.6 % — ABNORMAL HIGH (ref 11.5–15.5)
RDW: 16.5 % — ABNORMAL HIGH (ref 11.5–15.5)
WBC: 8.9 10*3/uL (ref 4.0–10.5)
WBC: 8.7 10*3/uL (ref 4.0–10.5)
nRBC: 0 % (ref 0.0–0.2)
nRBC: 0 % (ref 0.0–0.2)

## 2019-11-30 LAB — BASIC METABOLIC PANEL
Anion gap: 8 (ref 5–15)
Anion gap: 10 (ref 5–15)
Anion gap: 8 (ref 5–15)
BUN: 40 mg/dL — ABNORMAL HIGH (ref 8–23)
BUN: 38 mg/dL — ABNORMAL HIGH (ref 8–23)
BUN: 36 mg/dL — ABNORMAL HIGH (ref 8–23)
CO2: 23 mmol/L (ref 22–32)
CO2: 22 mmol/L (ref 22–32)
CO2: 23 mmol/L (ref 22–32)
Calcium: 8.6 mg/dL — ABNORMAL LOW (ref 8.9–10.3)
Calcium: 8.6 mg/dL — ABNORMAL LOW (ref 8.9–10.3)
Calcium: 8.5 mg/dL — ABNORMAL LOW (ref 8.9–10.3)
Chloride: 128 mmol/L — ABNORMAL HIGH (ref 98–111)
Chloride: 128 mmol/L — ABNORMAL HIGH (ref 98–111)
Chloride: 129 mmol/L — ABNORMAL HIGH (ref 98–111)
Creatinine, Ser: 1.26 mg/dL — ABNORMAL HIGH (ref 0.44–1.00)
Creatinine, Ser: 1.14 mg/dL — ABNORMAL HIGH (ref 0.44–1.00)
Creatinine, Ser: 1.1 mg/dL — ABNORMAL HIGH (ref 0.44–1.00)
GFR calc Af Amer: 43 mL/min — ABNORMAL LOW (ref >60)
GFR calc Af Amer: 49 mL/min — ABNORMAL LOW (ref >60)
GFR calc Af Amer: 51 mL/min — ABNORMAL LOW (ref >60)
GFR calc non Af Amer: 37 mL/min — ABNORMAL LOW (ref >60)
GFR calc non Af Amer: 42 mL/min — ABNORMAL LOW (ref >60)
GFR calc non Af Amer: 44 mL/min — ABNORMAL LOW (ref >60)
Glucose, Bld: 124 mg/dL — ABNORMAL HIGH (ref 70–99)
Glucose, Bld: 148 mg/dL — ABNORMAL HIGH (ref 70–99)
Glucose, Bld: 98 mg/dL (ref 70–99)
Potassium: 3.7 mmol/L (ref 3.5–5.1)
Potassium: 3.5 mmol/L (ref 3.5–5.1)
Potassium: 3.8 mmol/L (ref 3.5–5.1)
Sodium: 159 mmol/L — ABNORMAL HIGH (ref 135–145)
Sodium: 160 mmol/L — ABNORMAL HIGH (ref 135–145)
Sodium: 160 mmol/L — ABNORMAL HIGH (ref 135–145)

## 2019-11-30 MED ORDER — HEPARIN SODIUM (PORCINE) 5000 UNIT/ML IJ SOLN
5000.0000 [IU] | Freq: Three times a day (TID) | INTRAMUSCULAR | Status: DC
  Administered 2019-11-30 – 2019-12-05 (×15): 5000 [IU] via SUBCUTANEOUS
  Filled 2019-11-30 (×15): qty 1

## 2019-11-30 NOTE — Assessment & Plan Note (Signed)
-  EF 35 to 40% on April 2021 echo -Monitor for any signs of volume overload while on fluids

## 2019-11-30 NOTE — NC FL2 (Signed)
[place] MEDICAID FL2 LEVEL OF CARE SCREENING TOOL     IDENTIFICATION  Patient Name: Kelly Cross Birthdate: [birth year] Sex: female Admission Date (Current Location): 11/28/2019  [facility] and [place] Number:  Producer, television/film/video and Address:  [facility],  [address] [place], [place] [address]      Provider Number: [id number]  Attending Physician Name and Address:  Lewie Chamber, MD  Relative Name and Phone Number:  Pietro Cassis Legal Guardian [phone]    Current Level of Care: Hospital Recommended Level of Care: Skilled Nursing Facility Prior Approval Number:    Date Approved/Denied:   PASRR Number: [id number]  Discharge Plan: SNF    Current Diagnoses: Patient Active Problem List   Diagnosis Date Noted  . AKI (acute kidney injury) (HCC) 11/28/2019  . Chronic systolic CHF (congestive heart failure) (HCC) 11/28/2019  . Hypernatremia 11/28/2019  . Dehydration 11/28/2019  . Anemia 11/28/2019  . Elevated troponin 11/28/2019  . Other cirrhosis of liver (HCC) 11/28/2019  . Pleural effusion 11/28/2019  . Elevated liver enzymes 11/28/2019  . Elevated lactic acid level 11/28/2019  . Seizure disorder (HCC) 11/28/2019  . Rectal prolapse 04/30/2018  . Rectal mucosa prolapse 11/10/2017  . Chronic pain of right knee 05/15/2016  . Dementia of the Alzheimer's type without behavioral disturbance (HCC) 10/11/2013  . Gait instability 10/11/2013  . Memory loss 09/06/2012  . Benign paroxysmal positional vertigo 09/06/2012  . High cholesterol   . Anxiety     Orientation RESPIRATION BLADDER Height & Weight     Self  O2 (O2 2L [place]) Incontinent Weight: 57.2 kg Height:  5' 2.99" (160 cm)  BEHAVIORAL SYMPTOMS/MOOD NEUROLOGICAL BOWEL NUTRITION STATUS      Incontinent (Colostomy) Diet  AMBULATORY STATUS COMMUNICATION OF NEEDS Skin   Extensive Assist Verbally Bruising                       Personal Care Assistance Level of Assistance  Bathing,  Feeding, Dressing Bathing Assistance: Maximum assistance Feeding assistance: Limited assistance Dressing Assistance: Maximum assistance     Functional Limitations Info  Sight, Hearing, Speech Sight Info: Adequate Hearing Info: Adequate Speech Info: Adequate    SPECIAL CARE FACTORS FREQUENCY  PT (By licensed PT), OT (By licensed OT)     PT Frequency: Eval and Treat OT Frequency: Eval and Treat            Contractures Contractures Info: Not present    Additional Factors Info  Code Status, Allergies Code Status Info: DNR Allergies Info: Warfarin And Related           Current Medications (11/30/2019):  This is the current hospital active medication list Current Facility-Administered Medications  Medication Dose Route Frequency Provider Last Rate Last Admin  . acetaminophen (TYLENOL) tablet 650 mg  650 mg Oral Q6H PRN Therisa Doyne, MD       Or  . acetaminophen (TYLENOL) suppository 650 mg  650 mg Rectal Q6H PRN Doutova, Anastassia, MD      . albuterol (PROVENTIL) (2.5 MG/3ML) 0.083% nebulizer solution 2.5 mg  2.5 mg Nebulization Q2H PRN Doutova, Anastassia, MD      . aspirin chewable tablet 81 mg  81 mg Oral Daily Doutova, Anastassia, MD   81 mg at 11/30/19 1021  . atorvastatin (LIPITOR) tablet 40 mg  40 mg Oral Daily Doutova, Anastassia, MD   40 mg at 11/30/19 1021  . dextrose 5 % in lactated ringers infusion   Intravenous Continuous Samtani, Jai-Gurmukh,  MD 50 mL/hr at 11/30/19 0059 New Bag at 11/30/19 0059  . levETIRAcetam (KEPPRA) 100 MG/ML solution 750 mg  750 mg Oral BID Therisa Doyne, MD   750 mg at 11/30/19 1021  . metoprolol tartrate (LOPRESSOR) tablet 12.5 mg  12.5 mg Oral Daily Doutova, Anastassia, MD   12.5 mg at 11/30/19 1021  . multivitamin liquid 15 mL  15 mL Oral Daily Rhetta Mura, MD   15 mL at 11/30/19 1021  . ondansetron (ZOFRAN) tablet 4 mg  4 mg Oral Q6H PRN Doutova, Anastassia, MD       Or  . ondansetron (ZOFRAN) injection 4 mg  4  mg Intravenous Q6H PRN Therisa Doyne, MD      . Resource ThickenUp Clear   Oral PRN Rhetta Mura, MD      . sodium chloride flush (NS) 0.9 % injection 3 mL  3 mL Intravenous Q12H Doutova, Anastassia, MD   3 mL at 11/29/19 0132     Discharge Medications: Please see discharge summary for a list of discharge medications.  Relevant Imaging Results:  Relevant Lab Results:   Additional Information SS#[id number]  Geni Bers, RN

## 2019-11-30 NOTE — Assessment & Plan Note (Addendum)
-  Continue fluids and trend BMP - renal function responding to IVF; continue

## 2019-11-30 NOTE — Hospital Course (Addendum)
Ms. Johndrow is a 46 AA female with PMH dementia, hypertension, hyperlipidemia, sarcoidosis with no pulmonary involvement, seizure disorder, osteoporosis, CVA, Rectal prolapse status post rectopexy + colostomy creation 2019 who presented with hypernatremia, sodium 158.  She had also been having reported decreased oral intake and change in mentation.  Her baseline is that she is rarely verbal and has significant dementia per nursing staff at [facility].  The patient was more lethargic than usual and was referred to the ER for further evaluation and treatment.  Other history includes: In May patient was admitted to [facility] with seizures felt to be secondary to hypoglycemia as well as prior CVAs in the Alzheimer's induced epilepsy she undergone further evaluation including CT MRI and EEG and was discharged back to facility on Keppra 750 twice daily and her donepezil was discontinued Her hypoglycemia at that time felt to be secondary to accidental overdose with glipizide as her hypoglycemic panel was positive for glipizide She was also noted to have elevated LFTs in June 2021. AST was 121 ALT 193 CT scan of the liver done at that time showed possibility of early cirrhosis With diffuse fatty infiltration of the liver and gallstones early cirrhosis cirrhosis was confirmed with ultrasound of the liver as well.  She was admitted for treatment of her hypernatremia and placed on D5 LR infusion.  Hypernatremia was refractory to D5 LR infusion, therefore D10 was also added on and sodium levels responded some but never fully normalized.  Her appetite and meal intake remained low/poor throughout hospitalization. Her mentation did improve and she was able to interact some and answer some questions. She was considered back to her baseline mentation and felt to be stable for d/c back to SNF. However, she does have an extremely poor quality of life and appears to be declining slowly as expected in setting of advanced  dementia. Would strongly recommend that hospice be involved in her care with pursuit of comfort as she is expected to continue to decline and not benefit from ongoing futile medical care.

## 2019-11-30 NOTE — Assessment & Plan Note (Signed)
Continue Keppra.

## 2019-11-30 NOTE — TOC Progression Note (Signed)
Transition of Care [facility]) - Progression Note    Patient Details  Name: Kelly Cross MRN: [id number] Date of Birth: 08-Sep-1926  [facility] CM/SW Contact  Geni Bers, RN Phone Number: 11/30/2019, 2:53 PM  Clinical Narrative:     Pt is from [facility] and plan to return.        Expected Discharge Plan and Services                                                 Social Determinants of Health (SDOH) Interventions    Readmission Risk Interventions No flowsheet data found.

## 2019-11-30 NOTE — Progress Notes (Signed)
PROGRESS NOTE    Kelly Cross   OJJ:[id number]  DOB: 09/25/1926  DOA: 11/28/2019     2  PCP: Leeroy Cha, MD  CC: lethargy, hypernatremia  Hospital Course: Kelly Cross is a 26 AA female with PMH dementia, hypertension, hyperlipidemia, sarcoidosis with no pulmonary involvement, seizure disorder, osteoporosis, CVA, Rectal prolapse status post rectopexy + colostomy creation 2019 who presented with hypernatremia, sodium 158.  She had also been having reported decreased oral intake.  Her baseline is that she is rarely verbal and has significant dementia per nursing staff at [facility].  The patient was more lethargic than usual and was referred to the ER for further evaluation and treatment.  Other history includes: In May patient was admitted to [facility] with seizures felt to be secondary to hypoglycemia as well as prior CVAs in the Alzheimer's induced epilepsy she undergone further evaluation including CT MRI and EEG and was discharged back to facility on Keppra 750 twice daily and her donepezil was discontinued Her hypoglycemia at that time felt to be secondary to accidental overdose with glipizide as her hypoglycemic panel was positive for glipizide She was also noted to have elevated LFTs in June 2021. AST was 121 ALT 193 CT scan of the liver done at that time showed possibility of early cirrhosis With diffuse fatty infiltration of the liver and gallstones early cirrhosis cirrhosis was confirmed with ultrasound of the liver as well.  She was admitted for treatment of her hypernatremia and placed on D5 LR infusion.   Interval History:  No events overnight. She is resting in bed lethargic appearing but was recently eating applesauce and appeared to have fallen asleep.  She was awake and looking at me but would not speak. She did say "ouch" when her nail bed were pinched.   Old records reviewed in assessment of this patient  ROS: Review of systems not obtained due to  patient factors.  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  This seems to be her current state as well -will continue monitoring mentation while correcting hypernatremia, but if electrolyte abnormalities correct with no significant change in mentation, this is likely her baseline  AKI (acute kidney injury) ([place]) -Continue fluids and trend BMP  Chronic systolic CHF (congestive heart failure) (HCC) -EF 35 to 40% on April 2021 echo -Monitor for any signs of volume overload while on fluids  Hypernatremia -Continue D5 LR -Continue BMP every 6 hours -Monitor mentation but suspect we are close to her baseline  Seizure disorder (HCC) -Continue Keppra  Anemia - iron levels adequate; no further workup at this time   Antimicrobials: n/a  DVT prophylaxis: HSQ Code Status: DNR Family Communication: none Disposition Plan:  . Patient came from: SNF        . Barriers to d/c OR conditions which need to be met to effect a safe d/c: none currently . The current disposition plan is discharge to: SNF   Objective: Vitals:   11/29/19 0859 11/29/19 1321 11/29/19 2048 11/30/19 0510  BP: 125/83 101/60 117/70 133/88  Pulse: 78 72 70 77  Resp: '18 20 18 18  '$ Temp: 97.6 F (36.4 C) 97.9 F (36.6 C) 98 F (36.7 C) 98 F (36.7 C)  TempSrc: Oral Oral Oral Oral  SpO2: 99% 97% 94% 99%  Weight:      Height:  Intake/Output Summary (Last 24 hours) at 11/30/2019 1523 Last data filed at 11/30/2019 1400 Gross per 24 hour  Intake 1549.44 ml  Output 150 ml  Net 1399.44 ml   Filed Weights   11/28/19 2210  Weight: 57.2 kg    Examination: General appearance: awake, looking around, does not follow commands but does track with eyes Head: Normocephalic, without obvious abnormality Eyes: Obvious protrusion of eyeballs almost  exophthalmos Lungs: clear to auscultation bilaterally Heart: regular rate and rhythm and S1, S2 normal Abdomen: normal findings: bowel sounds normal Extremities: No edema Skin: mobility and turgor normal Neurologic: Moves all 4 extremities spontaneously.  Mostly nonverbal and does not follow commands  Consultants:   N/A  Procedures:   N/A  Data Reviewed: I have personally reviewed following labs and imaging studies Results for orders placed or performed during the hospital encounter of 11/28/19 (from the past 72 hour(s))  Blood gas, venous     Status: Abnormal   Collection Time: 11/28/19  5:02 PM  Result Value Ref Range   pH, Ven 7.363 7.25 - 7.43   pCO2, Ven 40.3 (L) 44 - 60 mmHg   pO2, Ven <31.0 (LL) 32 - 45 mmHg    Comment: CRITICAL RESULT CALLED TO, READ BACK BY AND VERIFIED WITH: SAVOIE,B. RN '@1734'$  11/28/19 BILLINGSLEY,L    Bicarbonate 22.4 20.0 - 28.0 mmol/L   Acid-base deficit 2.3 (H) 0.0 - 2.0 mmol/L   O2 Saturation 24.9 %   Patient temperature 98.6    Sample type VENOUS     Comment: Performed at [facility], [place] [address], [place], [place] [address]  CBC with Differential/Platelet     Status: Abnormal   Collection Time: 11/28/19  5:03 PM  Result Value Ref Range   WBC 8.6 4.0 - 10.5 K/uL   RBC 3.40 (L) 3.87 - 5.11 MIL/uL   Hemoglobin 11.1 (L) 12.0 - 15.0 g/dL   HCT 35.5 (L) 36 - 46 %   MCV 104.4 (H) 80.0 - 100.0 fL   MCH 32.6 26.0 - 34.0 pg   MCHC 31.3 30.0 - 36.0 g/dL   RDW 16.4 (H) 11.5 - 15.5 %   Platelets 114 (L) 150 - 400 K/uL    Comment: SPECIMEN CHECKED FOR CLOTS Immature Platelet Fraction may be clinically indicated, consider ordering this additional test [id number] PLATELET COUNT CONFIRMED BY SMEAR    nRBC 0.0 0.0 - 0.2 %   Neutrophils Relative % 75 %   Neutro Abs 6.5 1.7 - 7.7 K/uL   Lymphocytes Relative 16 %   Lymphs Abs 1.4 0.7 - 4.0 K/uL   Monocytes Relative 9 %   Monocytes Absolute 0.7 0 - 1 K/uL   Eosinophils Relative  0 %   Eosinophils Absolute 0.0 0 - 0 K/uL   Basophils Relative 0 %   Basophils Absolute 0.0 0 - 0 K/uL   Immature Granulocytes 0 %   Abs Immature Granulocytes 0.03 0.00 - 0.07 K/uL   Burr Cells PRESENT    Polychromasia PRESENT     Comment: Performed at [facility], [place] [address] [place], [place] [address]  Comprehensive metabolic panel     Status: Abnormal   Collection Time: 11/28/19  5:03 PM  Result Value Ref Range   Sodium 159 (H) 135 - 145 mmol/L   Potassium 3.9 3.5 - 5.1 mmol/L   Chloride 126 (H) 98 - 111 mmol/L   CO2 24 22 - 32 mmol/L   Glucose, Bld 133 (H) 70 -  99 mg/dL    Comment: Glucose reference range applies only to samples taken after fasting for at least 8 hours.   BUN 49 (H) 8 - 23 mg/dL   Creatinine, Ser 1.53 (H) 0.44 - 1.00 mg/dL   Calcium 8.9 8.9 - 10.3 mg/dL   Total Protein 7.0 6.5 - 8.1 g/dL   Albumin 3.4 (L) 3.5 - 5.0 g/dL   AST 168 (H) 15 - 41 U/L   ALT 166 (H) 0 - 44 U/L   Alkaline Phosphatase 253 (H) 38 - 126 U/L   Total Bilirubin 1.1 0.3 - 1.2 mg/dL   GFR calc non Af Amer 30 (L) >60 mL/min   GFR calc Af Amer 34 (L) >60 mL/min   Anion gap 9 5 - 15    Comment: Performed at [facility], [place] [address] [place], [place] [address]  Osmolality     Status: Abnormal   Collection Time: 11/28/19  5:03 PM  Result Value Ref Range   Osmolality 350 (HH) 275 - 295 mOsm/kg    Comment: REPEATED TO VERIFY CRITICAL RESULT CALLED TO, READ BACK BY AND VERIFIED WITH: RICHY BALIGOD RN.'@2220'$  ON 7.5.2021 BY TCALDWELL MT. Performed at [facility], [place] [address] [place], [place] [address]   Troponin I (High Sensitivity)     Status: Abnormal   Collection Time: 11/28/19  5:03 PM  Result Value Ref Range   Troponin I (High Sensitivity) 151 (HH) <18 ng/L    Comment: CRITICAL RESULT CALLED TO, READ BACK BY AND VERIFIED WITH: GARRISON,G. RN '@1829'$  11/28/19 BILLINGSLEY,L (NOTE) Elevated high sensitivity troponin I (hsTnI) values and  significant  changes across serial measurements may suggest ACS but many other  chronic and acute conditions are known to elevate hsTnI results.  Refer to the Links section for chest pain algorithms and additional  guidance. Performed at [facility], [place] [address], [place], [place] [address]   Brain natriuretic peptide     Status: Abnormal   Collection Time: 11/28/19  5:03 PM  Result Value Ref Range   B Natriuretic Peptide >4,500.0 (H) 0.0 - 100.0 pg/mL    Comment: Performed at [facility], [place] [address], [place], [place] [address]  SARS Coronavirus 2 by RT PCR (hospital order, performed in [facility] hospital lab) Nasopharyngeal Nasopharyngeal Swab     Status: None   Collection Time: 11/28/19  6:30 PM   Specimen: Nasopharyngeal Swab  Result Value Ref Range   SARS Coronavirus 2 NEGATIVE NEGATIVE    Comment: (NOTE) SARS-CoV-2 target nucleic acids are NOT DETECTED.  The SARS-CoV-2 RNA is generally detectable in upper and lower respiratory specimens during the acute phase of infection. The lowest concentration of SARS-CoV-2 viral copies this assay can detect is 250 copies / mL. A negative result does not preclude SARS-CoV-2 infection and should not be used as the sole basis for treatment or other patient management decisions.  A negative result may occur with improper specimen collection / handling, submission of specimen other than nasopharyngeal swab, presence of viral mutation(s) within the areas targeted by this assay, and inadequate number of viral copies (<250 copies / mL). A negative result must be combined with clinical observations, patient history, and epidemiological information.  Fact Sheet for Patients:   StrictlyIdeas.no  Fact Sheet for Healthcare Providers: BankingDealers.co.za  This test is not yet approved or  cleared by the [place] FDA and has been authorized for  detection and/or diagnosis of SARS-CoV-2 by FDA  under an Emergency Use Authorization (EUA).  This EUA will remain in effect (meaning this test can be used) for the duration of the COVID-19 declaration under Section 564(b)(1) of the Act, 21 U.S.C. section 360bbb-3(b)(1), unless the authorization is terminated or revoked sooner.  Performed at [facility], [place] [address] [place], [place] [address]   Troponin I (High Sensitivity)     Status: Abnormal   Collection Time: 11/28/19  7:35 PM  Result Value Ref Range   Troponin I (High Sensitivity) 171 (HH) <18 ng/L    Comment: CRITICAL RESULT CALLED TO, READ BACK BY AND VERIFIED WITH: HODGES,I. RN '@2045'$  11/28/19 BILLINGSLEY,L (NOTE) Elevated high sensitivity troponin I (hsTnI) values and significant  changes across serial measurements may suggest ACS but many other  chronic and acute conditions are known to elevate hsTnI results.  Refer to the Links section for chest pain algorithms and additional  guidance. Performed at [facility], [place] [address], [place], [place] [address]   Lactic acid, plasma     Status: Abnormal   Collection Time: 11/28/19  7:35 PM  Result Value Ref Range   Lactic Acid, Venous 2.5 (HH) 0.5 - 1.9 mmol/L    Comment: CRITICAL RESULT CALLED TO, READ BACK BY AND VERIFIED WITH: HODGES,I. RN '@2032'$  11/28/19 BILLINGSLEY,L Performed at [facility], [place] [address], [place], [place] [address]   Ammonia     Status: Abnormal   Collection Time: 11/28/19  7:35 PM  Result Value Ref Range   Ammonia 40 (H) 9 - 35 umol/L    Comment: Performed at [facility], [place] [address] [place], [place]  [address]  Magnesium     Status: Abnormal   Collection Time: 11/28/19  7:35 PM  Result Value Ref Range   Magnesium 2.5 (H) 1.7 - 2.4 mg/dL    Comment: Performed at [facility], [place] [address], [place], [place] [address]  Phosphorus     Status:  Abnormal   Collection Time: 11/28/19  7:35 PM  Result Value Ref Range   Phosphorus 6.0 (H) 2.5 - 4.6 mg/dL    Comment: Performed at [facility], [place] [address], [place], [place] [address]  CK     Status: None   Collection Time: 11/28/19  7:35 PM  Result Value Ref Range   Total CK 145 38.0 - 234.0 U/L    Comment: Performed at [facility], [place] [address], [place], [place] [address]  Lactic acid, plasma     Status: None   Collection Time: 11/28/19  9:31 PM  Result Value Ref Range   Lactic Acid, Venous 1.7 0.5 - 1.9 mmol/L    Comment: Performed at [facility], [place] [address], [place], [place] [address]  Basic metabolic panel     Status: Abnormal   Collection Time: 11/28/19 11:52 PM  Result Value Ref Range   Sodium 158 (H) 135 - 145 mmol/L   Potassium 4.1 3.5 - 5.1 mmol/L   Chloride 125 (H) 98 - 111 mmol/L   CO2 22 22 - 32 mmol/L   Glucose, Bld 114 (H) 70 - 99 mg/dL    Comment: Glucose reference range applies only to samples taken after fasting for at least 8 hours.   BUN 56 (H) 8 - 23 mg/dL   Creatinine, Ser 1.64 (H) 0.44 - 1.00 mg/dL   Calcium 8.9 8.9 - 10.3 mg/dL   GFR calc non Af Amer 27 (L) >60  mL/min   GFR calc Af Amer 32 (L) >60 mL/min   Anion gap 11 5 - 15    Comment: Performed at [facility], [place] [address] [place], [place] [address]  Lactic acid, plasma     Status: Abnormal   Collection Time: 11/28/19 11:52 PM  Result Value Ref Range   Lactic Acid, Venous 2.0 (HH) 0.5 - 1.9 mmol/L    Comment: CRITICAL RESULT CALLED TO, READ BACK BY AND VERIFIED WITH: RN RICCI AT 1017 11/29/19 CRUICKSHANK A Performed at [facility], [place] [address], [place], [place] [address]   Troponin I (High Sensitivity)     Status: Abnormal   Collection Time: 11/28/19 11:52 PM  Result Value Ref Range   Troponin I (High Sensitivity) 153 (HH) <18 ng/L    Comment: CRITICAL VALUE NOTED.  VALUE IS CONSISTENT  WITH PREVIOUSLY REPORTED AND CALLED VALUE. (NOTE) Elevated high sensitivity troponin I (hsTnI) values and significant  changes across serial measurements may suggest ACS but many other  chronic and acute conditions are known to elevate hsTnI results.  Refer to the Links section for chest pain algorithms and additional  guidance. Performed at [facility], [place] [address], [place], [place] [address]   Troponin I (High Sensitivity)     Status: Abnormal   Collection Time: 11/29/19  2:40 AM  Result Value Ref Range   Troponin I (High Sensitivity) 147 (HH) <18 ng/L    Comment: CRITICAL VALUE NOTED.  VALUE IS CONSISTENT WITH PREVIOUSLY REPORTED AND CALLED VALUE. (NOTE) Elevated high sensitivity troponin I (hsTnI) values and significant  changes across serial measurements may suggest ACS but many other  chronic and acute conditions are known to elevate hsTnI results.  Refer to the Links section for chest pain algorithms and additional  guidance. Performed at [facility], [place] [address], [place], [place] [address]   Magnesium     Status: Abnormal   Collection Time: 11/29/19  2:40 AM  Result Value Ref Range   Magnesium 2.6 (H) 1.7 - 2.4 mg/dL    Comment: Performed at [facility], [place] [address] [place], [place] [address]  Phosphorus     Status: Abnormal   Collection Time: 11/29/19  2:40 AM  Result Value Ref Range   Phosphorus 5.8 (H) 2.5 - 4.6 mg/dL    Comment: Performed at [facility], [place] [address], [place], [place] [address]  CBC WITH DIFFERENTIAL     Status: Abnormal   Collection Time: 11/29/19  2:40 AM  Result Value Ref Range   WBC 9.1 4.0 - 10.5 K/uL   RBC 3.23 (L) 3.87 - 5.11 MIL/uL   Hemoglobin 11.0 (L) 12.0 - 15.0 g/dL   HCT 34.6 (L) 36 - 46 %   MCV 107.1 (H) 80.0 - 100.0 fL   MCH 34.1 (H) 26.0 - 34.0 pg   MCHC 31.8 30.0 - 36.0 g/dL   RDW 16.4 (H) 11.5 - 15.5 %   Platelets 100 (L) 150 - 400  K/uL    Comment: Immature Platelet Fraction may be clinically indicated, consider ordering this additional test [id number]    nRBC 0.0 0.0 - 0.2 %   Neutrophils Relative % 75 %   Neutro Abs 6.8 1.7 - 7.7 K/uL   Lymphocytes Relative 17 %   Lymphs Abs 1.5 0.7 - 4.0 K/uL   Monocytes Relative 8 %   Monocytes Absolute 0.7 0 - 1 K/uL   Eosinophils Relative 0 %  Eosinophils Absolute 0.0 0 - 0 K/uL   Basophils Relative 0 %   Basophils Absolute 0.0 0 - 0 K/uL   Immature Granulocytes 0 %   Abs Immature Granulocytes 0.03 0.00 - 0.07 K/uL    Comment: Performed at [facility], [address] [place], [place] [address]  TSH     Status: None   Collection Time: 11/29/19  2:40 AM  Result Value Ref Range   TSH 0.401 0.350 - 4.500 uIU/mL    Comment: Performed by a 3rd Generation assay with a functional sensitivity of <=0.01 uIU/mL. Performed at [facility], [address] [place], [place] [address]   Comprehensive metabolic panel     Status: Abnormal   Collection Time: 11/29/19  2:40 AM  Result Value Ref Range   Sodium 161 (HH) 135 - 145 mmol/L    Comment: CRITICAL RESULT CALLED TO, READ BACK BY AND VERIFIED WITH: RN RICCI AT 7017 11/29/19 CRUICKSHANK A    Potassium 3.9 3.5 - 5.1 mmol/L   Chloride 125 (H) 98 - 111 mmol/L   CO2 22 22 - 32 mmol/L   Glucose, Bld 122 (H) 70 - 99 mg/dL    Comment: Glucose reference range applies only to samples taken after fasting for at least 8 hours.   BUN 53 (H) 8 - 23 mg/dL   Creatinine, Ser 8.43 (H) 0.44 - 1.00 mg/dL   Calcium 9.0 8.9 - 33.2 mg/dL   Total Protein 6.6 6.5 - 8.1 g/dL   Albumin 3.4 (L) 3.5 - 5.0 g/dL   AST 745 (H) 15 - 41 U/L   ALT 156 (H) 0 - 44 U/L   Alkaline Phosphatase 244 (H) 38 - 126 U/L   Total Bilirubin 1.2 0.3 - 1.2 mg/dL   GFR calc non Af Amer 29 (L) >60 mL/min   GFR calc Af Amer 34 (L) >60 mL/min   Anion gap 14 5 - 15    Comment: Performed at [facility], [address] [place], [place] [address]  Urinalysis, Routine w reflex microscopic     Status: Abnormal   Collection Time: 11/29/19  4:54 AM  Result Value Ref Range   Color, Urine YELLOW YELLOW   APPearance CLEAR CLEAR   Specific Gravity, Urine 1.020 1.005 - 1.030   pH 5.0 5.0 - 8.0   Glucose, UA NEGATIVE NEGATIVE mg/dL   Hgb urine dipstick NEGATIVE NEGATIVE   Bilirubin Urine NEGATIVE NEGATIVE   Ketones, ur NEGATIVE NEGATIVE mg/dL   Protein, ur 30 (A) NEGATIVE mg/dL   Nitrite NEGATIVE NEGATIVE   Leukocytes,Ua MODERATE (A) NEGATIVE   RBC / HPF 0-5 0 - 5 RBC/hpf   WBC, UA 6-10 0 - 5 WBC/hpf   Bacteria, UA RARE (A) NONE SEEN   Squamous Epithelial / LPF 0-5 0 - 5    Comment: Performed at [facility], [address], [place], [place] [address]  Osmolality, urine     Status: None   Collection Time: 11/29/19  4:55 AM  Result Value Ref Range   Osmolality, Ur 708 300 - 900 mOsm/kg    Comment: PERFORMED AT [facility] Performed at [facility], [address], [place], [place] [address]   Sodium, urine, random     Status: None   Collection Time: 11/29/19  4:55 AM  Result Value Ref Range   Sodium, Ur <10 mmol/L    Comment: Performed at [facility], [address] [place], [place] [address]  Creatinine, urine, random     Status: None   Collection Time: 11/29/19  4:55 AM  Result Value Ref Range   Creatinine, Urine 154.21 mg/dL    Comment: Performed at [facility], [place] [address] [place], [place] [address]  Basic metabolic panel     Status: Abnormal   Collection Time: 11/29/19  5:40 AM  Result Value Ref Range   Sodium 157 (H) 135 - 145 mmol/L   Potassium 3.5 3.5 - 5.1 mmol/L   Chloride 128 (H) 98 - 111 mmol/L   CO2 21 (L) 22 - 32 mmol/L   Glucose, Bld 127 (H) 70 - 99 mg/dL    Comment: Glucose reference range applies only to samples taken after fasting for at least 8 hours.   BUN 53 (H) 8 - 23 mg/dL   Creatinine,  Ser 1.45 (H) 0.44 - 1.00 mg/dL   Calcium 8.6 (L) 8.9 - 10.3 mg/dL   GFR calc non Af Amer 32 (L) >60 mL/min   GFR calc Af Amer 37 (L) >60 mL/min   Anion gap 8 5 - 15    Comment: Performed at [facility], [place] [address] [place], [place] [address]  Vitamin B12     Status: Abnormal   Collection Time: 11/29/19  5:40 AM  Result Value Ref Range   Vitamin B-12 1,414 (H) 180 - 914 pg/mL    Comment: (NOTE) This assay is not validated for testing neonatal or myeloproliferative syndrome specimens for Vitamin B12 levels. Performed at [facility], [place] [address], [place], [place] [address]   Folate     Status: None   Collection Time: 11/29/19  5:40 AM  Result Value Ref Range   Folate 26.4 >5.9 ng/mL    Comment: RESULTS CONFIRMED BY MANUAL DILUTION Performed at [place] [address] [place], [place] [address]   Iron and TIBC     Status: None   Collection Time: 11/29/19  5:40 AM  Result Value Ref Range   Iron 52 28 - 170 ug/dL   TIBC 299 250 - 450 ug/dL   Saturation Ratios 17 10.4 - 31.8 %   UIBC 247 ug/dL    Comment: Performed at [facility], [place] [address], [place], [place] [address]  Ferritin     Status: None   Collection Time: 11/29/19  5:40 AM  Result Value Ref Range   Ferritin 52 11 - 307 ng/mL    Comment: Performed at [facility], [place] [address], [place], [place] [address]  Reticulocytes     Status: Abnormal   Collection Time: 11/29/19  5:40 AM  Result Value Ref Range   Retic Ct Pct 2.4 0.4 - 3.1 %   RBC. 3.35 (L) 3.87 - 5.11 MIL/uL   Retic Count, Absolute 81.1 19.0 - 186.0 K/uL   Immature Retic Fract 26.9 (H) 2.3 - 15.9 %    Comment: Performed at [facility], [place] [address] [place], [place] [address]  Basic metabolic panel     Status: Abnormal   Collection Time: 11/29/19 12:13 PM  Result Value Ref Range   Sodium 159 (H) 135 - 145 mmol/L   Potassium  4.6 3.5 - 5.1 mmol/L    Comment: DELTA CHECK NOTED NO VISIBLE HEMOLYSIS    Chloride 127 (H) 98 - 111 mmol/L   CO2 20 (L) 22 - 32 mmol/L   Glucose, Bld 156 (H) 70 -  99 mg/dL    Comment: Glucose reference range applies only to samples taken after fasting for at least 8 hours.   BUN 54 (H) 8 - 23 mg/dL   Creatinine, Ser 1.48 (H) 0.44 - 1.00 mg/dL   Calcium 8.3 (L) 8.9 - 10.3 mg/dL   GFR calc non Af Amer 31 (L) >60 mL/min   GFR calc Af Amer 36 (L) >60 mL/min   Anion gap 12 5 - 15    Comment: Performed at [facility], [place] [address], [place], [place] [address]  Comprehensive metabolic panel     Status: Abnormal   Collection Time: 11/29/19  5:10 PM  Result Value Ref Range   Sodium 160 (H) 135 - 145 mmol/L   Potassium 4.1 3.5 - 5.1 mmol/L   Chloride 129 (H) 98 - 111 mmol/L   CO2 24 22 - 32 mmol/L   Glucose, Bld 110 (H) 70 - 99 mg/dL    Comment: Glucose reference range applies only to samples taken after fasting for at least 8 hours.   BUN 51 (H) 8 - 23 mg/dL   Creatinine, Ser 1.54 (H) 0.44 - 1.00 mg/dL   Calcium 8.7 (L) 8.9 - 10.3 mg/dL   Total Protein 6.2 (L) 6.5 - 8.1 g/dL   Albumin 3.0 (L) 3.5 - 5.0 g/dL   AST 119 (H) 15 - 41 U/L   ALT 132 (H) 0 - 44 U/L   Alkaline Phosphatase 225 (H) 38 - 126 U/L   Total Bilirubin 0.9 0.3 - 1.2 mg/dL   GFR calc non Af Amer 29 (L) >60 mL/min   GFR calc Af Amer 34 (L) >60 mL/min   Anion gap 7 5 - 15    Comment: Performed at [facility], [place] [address], [place], [place] [address]  CBC with Differential/Platelet     Status: Abnormal   Collection Time: 11/30/19  4:00 AM  Result Value Ref Range   WBC 8.9 4.0 - 10.5 K/uL   RBC 3.37 (L) 3.87 - 5.11 MIL/uL   Hemoglobin 11.2 (L) 12.0 - 15.0 g/dL   HCT 36.2 36 - 46 %   MCV 107.4 (H) 80.0 - 100.0 fL   MCH 33.2 26.0 - 34.0 pg   MCHC 30.9 30.0 - 36.0 g/dL   RDW 16.6 (H) 11.5 - 15.5 %   Platelets 96 (L) 150 - 400 K/uL    Comment: CONSISTENT WITH PREVIOUS  RESULT Immature Platelet Fraction may be clinically indicated, consider ordering this additional test [id number] REPEATED TO VERIFY    nRBC 0.0 0.0 - 0.2 %   Neutrophils Relative % 72 %   Neutro Abs 6.3 1.7 - 7.7 K/uL   Lymphocytes Relative 18 %   Lymphs Abs 1.6 0.7 - 4.0 K/uL   Monocytes Relative 9 %   Monocytes Absolute 0.8 0 - 1 K/uL   Eosinophils Relative 1 %   Eosinophils Absolute 0.1 0 - 0 K/uL   Basophils Relative 0 %   Basophils Absolute 0.0 0 - 0 K/uL   Immature Granulocytes 0 %   Abs Immature Granulocytes 0.04 0.00 - 0.07 K/uL    Comment: Performed at [facility], [place] [address] [place], [place] [id number]  Basic metabolic panel     Status: Abnormal   Collection Time: 11/30/19 10:51 AM  Result Value Ref Range   Sodium 159 (H) 135 - 145 mmol/L   Potassium 3.7 3.5 - 5.1 mmol/L   Chloride 128 (H) 98 -  111 mmol/L   CO2 23 22 - 32 mmol/L   Glucose, Bld 124 (H) 70 - 99 mg/dL    Comment: Glucose reference range applies only to samples taken after fasting for at least 8 hours.   BUN 40 (H) 8 - 23 mg/dL   Creatinine, Ser 4.86 (H) 0.44 - 1.00 mg/dL   Calcium 8.6 (L) 8.9 - 10.3 mg/dL   GFR calc non Af Amer 37 (L) >60 mL/min   GFR calc Af Amer 43 (L) >60 mL/min   Anion gap 8 5 - 15    Comment: Performed at [facility], [address], [place], [place] [address]  CBC with Differential/Platelet     Status: Abnormal   Collection Time: 11/30/19 10:51 AM  Result Value Ref Range   WBC 8.7 4.0 - 10.5 K/uL   RBC 3.50 (L) 3.87 - 5.11 MIL/uL   Hemoglobin 11.8 (L) 12.0 - 15.0 g/dL   HCT 75.3 36 - 46 %   MCV 108.0 (H) 80.0 - 100.0 fL   MCH 33.7 26.0 - 34.0 pg   MCHC 31.2 30.0 - 36.0 g/dL   RDW 01.0 (H) 40.4 - 59.1 %   Platelets 94 (L) 150 - 400 K/uL    Comment: CONSISTENT WITH PREVIOUS RESULT Immature Platelet Fraction may be clinically indicated, consider ordering this additional test [id number]    nRBC 0.0 0.0 - 0.2 %   Neutrophils  Relative % 68 %   Neutro Abs 5.9 1.7 - 7.7 K/uL   Lymphocytes Relative 21 %   Lymphs Abs 1.8 0.7 - 4.0 K/uL   Monocytes Relative 8 %   Monocytes Absolute 0.7 0 - 1 K/uL   Eosinophils Relative 2 %   Eosinophils Absolute 0.2 0 - 0 K/uL   Basophils Relative 1 %   Basophils Absolute 0.1 0 - 0 K/uL   Immature Granulocytes 0 %   Abs Immature Granulocytes 0.03 0.00 - 0.07 K/uL    Comment: Performed at [facility], [address] [place], [place] [address]     Recent Results (from the past 240 hour(s))  SARS Coronavirus 2 by RT PCR (hospital order, performed in [facility] hospital lab) Nasopharyngeal Nasopharyngeal Swab     Status: None   Collection Time: 11/28/19  6:30 PM   Specimen: Nasopharyngeal Swab  Result Value Ref Range Status   SARS Coronavirus 2 NEGATIVE NEGATIVE Final    Comment: (NOTE) SARS-CoV-2 target nucleic acids are NOT DETECTED.  The SARS-CoV-2 RNA is generally detectable in upper and lower respiratory specimens during the acute phase of infection. The lowest concentration of SARS-CoV-2 viral copies this assay can detect is 250 copies / mL. A negative result does not preclude SARS-CoV-2 infection and should not be used as the sole basis for treatment or other patient management decisions.  A negative result may occur with improper specimen collection / handling, submission of specimen other than nasopharyngeal swab, presence of viral mutation(s) within the areas targeted by this assay, and inadequate number of viral copies (<250 copies / mL). A negative result must be combined with clinical observations, patient history, and epidemiological information.  Fact Sheet for Patients:   BoilerBrush.com.cy  Fact Sheet for Healthcare Providers: https://pope.com/  This test is not yet approved or  cleared by the [place] FDA and has been authorized for detection and/or diagnosis of SARS-CoV-2 by FDA  under an Emergency Use Authorization (EUA).  This EUA will remain in effect (meaning this test can be  used) for the duration of the COVID-19 declaration under Section 564(b)(1) of the Act, 21 U.S.C. section 360bbb-3(b)(1), unless the authorization is terminated or revoked sooner.  Performed at [facility], [place] [address], [place], [place] [address]      Radiology Studies: DG Chest Port 1 View  Result Date: 11/30/2019 CLINICAL DATA:  Increased confusion this morning, history pneumonia, Alzheimer's, sarcoidosis, cirrhosis, CHF, hypertension EXAM: PORTABLE CHEST 1 VIEW COMPARISON:  Portable exam 0836 hours compared to 11/28/2019 FINDINGS: Enlargement of cardiac silhouette with pulmonary vascular congestion. Atherosclerotic calcification aorta. In increased RIGHT pleural effusion and basilar atelectasis. Minimal effusion and atelectasis at LEFT base. No pneumothorax. Bones demineralized with mild degenerative disc disease changes thoracic spine. IMPRESSION: Bibasilar pleural effusions and atelectasis LEFT greater than RIGHT, slightly increased on LEFT since prior study. Enlargement of cardiac silhouette with pulmonary vascular congestion. Electronically Signed   By: Lavonia Dana M.D.   On: 11/30/2019 09:15   DG Chest Port 1 View  Result Date: 11/28/2019 CLINICAL DATA:  Shortness of breath. EXAM: PORTABLE CHEST 1 VIEW COMPARISON:  None. FINDINGS: Mild atelectasis and/or in the bilateral lower lobes. Small to moderate sized bilateral pleural effusions are seen, right greater than left. No pneumothorax is identified. The cardiac silhouette is moderately enlarged. The visualized skeletal structures are unremarkable. IMPRESSION: 1. Small to moderate sized bilateral pleural effusions, right greater than left. 2. Mild bilateral lower lobes atelectasis and/or infiltrate. 3. Moderate cardiomegaly. Electronically Signed   By: Virgina Norfolk M.D.   On: 11/28/2019 18:04   DG Chest [facility] 1 View   Final Result    DG Chest Port 1 View  Final Result       Scheduled Meds: . aspirin  81 mg Oral Daily  . atorvastatin  40 mg Oral Daily  . levETIRAcetam  750 mg Oral BID  . metoprolol tartrate  12.5 mg Oral Daily  . multivitamin  15 mL Oral Daily  . sodium chloride flush  3 mL Intravenous Q12H   PRN Meds: acetaminophen **OR** acetaminophen, albuterol, ondansetron **OR** ondansetron (ZOFRAN) IV, Resource ThickenUp Clear Continuous Infusions: . dextrose 5% lactated ringers 50 mL/hr at 11/30/19 0059      LOS: 2 days  Time spent: Greater than 50% of the 35 minute visit was spent in counseling/coordination of care for the patient as laid out in the A&P.   Dwyane Dee, MD [facility] 11/30/2019, 3:23 PM   Contact via secure chat.  To contact the attending provider between 7A-7P or the covering provider during after hours 7P-7A, please log into the web site [email or web address] and access using universal [facility] password for that web site. If you do not have the password, please call the hospital operator.

## 2019-11-30 NOTE — Progress Notes (Signed)
Pt had not voided since 0930 when she was incontinent and peri care performed. Bladder scan performed showed 250-300 ml's. Pt unable to voice whether or not she has the urge to void. Dr. Frederick Peers made aware. In&out cath performed. 400 ml's yellow urine returned. Pt tolerated well.

## 2019-11-30 NOTE — Progress Notes (Addendum)
Per central telemetry, pt had 22 beat run of Vtach on telemetry around 1630. Asymptomatic. Dr. Frederick Peers made aware. Will continue to monitor.

## 2019-11-30 NOTE — Assessment & Plan Note (Signed)
-  Continue Lipitor °

## 2019-11-30 NOTE — Assessment & Plan Note (Addendum)
-   refractory to D5LR; will add on D10 and monitor BMP q4h then de-escalate as able - mentation has improved some and is considered back to baseline

## 2019-11-30 NOTE — Assessment & Plan Note (Addendum)
-  Per nursing staff at [facility] patient is mostly nonverbal and minimally interactive.  This seems to be her current state as well -will continue monitoring mentation while correcting hypernatremia, but if electrolyte abnormalities correct with no significant change in mentation, this is likely her baseline - strongly recommend hospice and comfort care; she responded some to infusions however she is likely to continue to decline; ongoing medical care seems futile at this juncture

## 2019-11-30 NOTE — Assessment & Plan Note (Signed)
-   iron levels adequate; no further workup at this time

## 2019-11-30 NOTE — Consult Note (Signed)
Consultation Note Date: 11/30/2019   Patient Name: Kelly Cross  DOB: 05/03/1927  MRN: [id number]  Age / Sex: 86 y.o., female  PCP: Kelly Ishihara, MD Referring Physician: Lewie Chamber, MD  Reason for Consultation: Establishing goals of care  HPI/Patient Profile: 86 y.o. female  with past medical history of   admitted on 11/28/2019 with  .   Clinical Assessment and Goals of Care:  86 yo lady who is from SNF, has Cross, HTN, pulmonary sarcoidosis, restless leg syndrome, history of recent hospitalizations for seizures and parotid gland lesion, has a legal guardian.   Patient admitted to Cross medicine service this time at [facility] with lethargy, electrolyte abnormalities such as high Na and Cl levels. Also with AKI and with EF known to be 35%, concern for systolic heart failure, pleural effusions and high index of suspicion for aspiration.   A palliative consult has been requested for goals of care discussions.   Kelly Cross is awake alert, tracks me in the room, doesn't verbalize much, nursing colleagues in the room, assisting patient with thickened liquids and her breakfast, patient with coughing episodes and clearing her throat after sipping on thickened juice.   No family at bedside, chart reviewed, noted to have a legal guardian.   Palliative medicine is specialized medical care for people living with serious illness. It focuses on providing relief from the symptoms and stress of a serious illness. The goal is to improve quality of life for both the patient and the family.  Goals of care: Broad aims of medical therapy in relation to the patient's values and preferences. Our aim is to provide medical care aimed at enabling patients to achieve the goals that matter most to them, given the circumstances of their particular medical situation and their constraints.   Discussed  with bedside staff, see recommendations below.   LEGAL GUARDIAN Listed as Kelly Cross on the chart.    SUMMARY OF RECOMMENDATIONS    Agree with DNR, chart reviewed, including 2 recent hospitalizations at [facility] recently.  Recommend D/C back to facility with palliative services following.  Call placed but at the moment, unable to reach guardianship social worker [facility] at [phone]. Wanted to discuss adding palliative care as an extra layer of support out at the patient's SNF and to update on current mode of care and scope of current hospitalization.  Thank you for the consult.   Code Status/Advance Care Planning:  DNR    Symptom Management:    med history noted, continue current scope of care.   Palliative Prophylaxis:   Delirium Protocol   Psycho-social/Spiritual:   Desire for further Chaplaincy support:yes  Additional Recommendations: Education on Hospice  Prognosis:   Unable to determine  Discharge Planning: recommend discharge back to SNF with palliative services following.       Primary Diagnoses: Present on Admission: . High cholesterol . Anxiety . 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 AKI (acute kidney injury) ([facility]) . Chronic systolic  CHF (congestive heart failure) ([facility]) . Hypernatremia . Dehydration . Anemia . Elevated troponin . Other cirrhosis of liver ([facility]) . Pleural effusion . Elevated liver enzymes . Elevated lactic acid level   I have reviewed the medical record, interviewed the patient and family, and examined the patient. The following aspects are pertinent.  Past Medical History:  Diagnosis Date  . Kelly Cross ([facility])   . Anxiety   . Arthritis   . Complication of anesthesia    harder to wake up from Anesthesia  . Family history of adverse reaction to anesthesia    son- Harder to wake up from Anesthesia  . High blood pressure   . High cholesterol   . Sarcoidosis     lungs- no problems for a long time   Social History   Socioeconomic History  . Marital status: Widowed    Spouse name: Not on file  . Number of children: 3  . Years of education: Not on file  . Highest education level: Not on file  Occupational History  . Not on file  Tobacco Use  . Smoking status: Never Smoker  . Smokeless tobacco: Never Used  Vaping Use  . Vaping Use: Never used  Substance and Sexual Activity  . Alcohol use: No  . Drug use: No  . Sexual activity: Not Currently  Other Topics Concern  . Not on file  Social History Narrative   Living with 2 sons.     Social Determinants of Health   Financial Resource Strain:   . Difficulty of Paying Living Expenses:   Food Insecurity:   . Worried About Programme researcher, broadcasting/film/video in the Last Year:   . Barista in the Last Year:   Transportation Needs:   . Freight forwarder (Medical):   Marland Kitchen Lack of Transportation (Non-Medical):   Physical Activity:   . Days of Exercise per Week:   . Minutes of Exercise per Session:   Stress:   . Feeling of Stress :   Social Connections:   . Frequency of Communication with Friends and Family:   . Frequency of Social Gatherings with Friends and Family:   . Attends Religious Services:   . Active Member of Clubs or Organizations:   . Attends Banker Meetings:   Marland Kitchen Marital Status:    Family History  Problem Relation Age of Onset  . Diabetes Brother   . Leukemia Sister    Scheduled Meds: . aspirin  81 mg Oral Daily  . atorvastatin  40 mg Oral Daily  . levETIRAcetam  750 mg Oral BID  . metoprolol tartrate  12.5 mg Oral Daily  . multivitamin  15 mL Oral Daily  . sodium chloride flush  3 mL Intravenous Q12H   Continuous Infusions: . dextrose 5% lactated ringers 50 mL/hr at 11/30/19 0059   PRN Meds:.acetaminophen **OR** acetaminophen, albuterol, ondansetron **OR** ondansetron (ZOFRAN) IV, Resource ThickenUp Clear Medications Prior to Admission:  Prior to Admission  medications   Medication Sig Start Date End Date Taking? Authorizing Provider  aspirin 81 MG chewable tablet Chew 81 mg by mouth daily.   Yes [provider]  atorvastatin (LIPITOR) 40 MG tablet Take 40 mg by mouth daily.   Yes [provider]  levETIRAcetam (KEPPRA) 100 MG/ML solution Take 750 mg by mouth 2 (two) times daily.   Yes [provider]  lisinopril (ZESTRIL) 5 MG tablet Take 5 mg by mouth daily.   Yes [provider]  memantine (  NAMENDA) 10 MG tablet Take 10 mg by mouth 2 (two) times daily.   Yes [provider]  metoprolol tartrate (LOPRESSOR) 25 MG tablet Take 12.5 mg by mouth daily.   Yes [provider]  polyethylene glycol (MIRALAX / GLYCOLAX) 17 g packet Take 17 g by mouth daily.   Yes [provider]  potassium chloride SA (K-DUR,KLOR-CON) 20 MEQ tablet Take 20 mEq by mouth every other day.  04/05/18  Yes [provider]  sertraline (ZOLOFT) 100 MG tablet Take 100 mg by mouth daily.  09/16/14  Yes [provider]  Suvorexant (BELSOMRA) 5 MG TABS Take 5 mg by mouth at bedtime.   Yes [provider]  omeprazole (PRILOSEC) 20 MG capsule Take 20 mg by mouth daily. Patient not taking: Reported on 11/28/2019 09/23/13   [provider]   Allergies  Allergen Reactions  . Warfarin And Related Other (See Comments)    unknown   Review of Systems Confused as is baseline.   Physical Exam Awake alert Patient with coughing while attempting to drink thickened juice.  Tracks me in the room Doesn't verbalize much Regular work of breathing S1 S2 Abdomen not tender No edema  Vital Signs: BP 133/88 (BP Location: Left Arm)   Pulse 77   Temp 98 F (36.7 C) (Oral)   Resp 18   Ht 5' 2.99" (1.6 m)   Wt 57.2 kg   SpO2 99%   BMI 22.34 kg/m  Pain Scale: PAINAD POSS *See Group Information*: S-Acceptable,Sleep, easy to arouse     SpO2: SpO2: 99 % O2 Device:SpO2: 99 % O2 Flow Rate: .O2  Flow Rate (L/min): 2 L/min  IO: Intake/output summary:   Intake/Output Summary (Last 24 hours) at 11/30/2019 1244 Last data filed at 11/30/2019 0444 Gross per 24 hour  Intake 1118.28 ml  Output 300 ml  Net 818.28 ml    LBM: Last BM Date: 11/30/19 (small amount of stool in colostomy) Baseline Weight: Weight: 57.2 kg Most recent weight: Weight: 57.2 kg     Palliative Assessment/Data:   PPS 30%  Time In:  11 Time Out:  12 Time Total:  60  Greater than 50%  of this time was spent counseling and coordinating care related to the above assessment and plan.  Signed by: Rosalin Hawking, MD   Please contact Palliative Medicine Team phone at [phone] for questions and concerns.  For individual provider: See Loretha Stapler

## 2019-12-01 LAB — BASIC METABOLIC PANEL
Anion gap: 9 (ref 5–15)
Anion gap: 7 (ref 5–15)
Anion gap: 4 — ABNORMAL LOW (ref 5–15)
Anion gap: 6 (ref 5–15)
Anion gap: 9 (ref 5–15)
BUN: 35 mg/dL — ABNORMAL HIGH (ref 8–23)
BUN: 34 mg/dL — ABNORMAL HIGH (ref 8–23)
BUN: 34 mg/dL — ABNORMAL HIGH (ref 8–23)
BUN: 33 mg/dL — ABNORMAL HIGH (ref 8–23)
BUN: 30 mg/dL — ABNORMAL HIGH (ref 8–23)
CO2: 22 mmol/L (ref 22–32)
CO2: 24 mmol/L (ref 22–32)
CO2: 25 mmol/L (ref 22–32)
CO2: 25 mmol/L (ref 22–32)
CO2: 24 mmol/L (ref 22–32)
Calcium: 8.5 mg/dL — ABNORMAL LOW (ref 8.9–10.3)
Calcium: 8.7 mg/dL — ABNORMAL LOW (ref 8.9–10.3)
Calcium: 8.7 mg/dL — ABNORMAL LOW (ref 8.9–10.3)
Calcium: 8.4 mg/dL — ABNORMAL LOW (ref 8.9–10.3)
Calcium: 8.5 mg/dL — ABNORMAL LOW (ref 8.9–10.3)
Chloride: 129 mmol/L — ABNORMAL HIGH (ref 98–111)
Chloride: 128 mmol/L — ABNORMAL HIGH (ref 98–111)
Chloride: 130 mmol/L — ABNORMAL HIGH (ref 98–111)
Chloride: 128 mmol/L — ABNORMAL HIGH (ref 98–111)
Chloride: 124 mmol/L — ABNORMAL HIGH (ref 98–111)
Creatinine, Ser: 1.13 mg/dL — ABNORMAL HIGH (ref 0.44–1.00)
Creatinine, Ser: 1.13 mg/dL — ABNORMAL HIGH (ref 0.44–1.00)
Creatinine, Ser: 1.11 mg/dL — ABNORMAL HIGH (ref 0.44–1.00)
Creatinine, Ser: 1.11 mg/dL — ABNORMAL HIGH (ref 0.44–1.00)
Creatinine, Ser: 1.08 mg/dL — ABNORMAL HIGH (ref 0.44–1.00)
GFR calc Af Amer: 50 mL/min — ABNORMAL LOW (ref >60)
GFR calc Af Amer: 50 mL/min — ABNORMAL LOW (ref >60)
GFR calc Af Amer: 51 mL/min — ABNORMAL LOW (ref >60)
GFR calc Af Amer: 51 mL/min — ABNORMAL LOW (ref >60)
GFR calc Af Amer: 52 mL/min — ABNORMAL LOW (ref >60)
GFR calc non Af Amer: 43 mL/min — ABNORMAL LOW (ref >60)
GFR calc non Af Amer: 43 mL/min — ABNORMAL LOW (ref >60)
GFR calc non Af Amer: 44 mL/min — ABNORMAL LOW (ref >60)
GFR calc non Af Amer: 44 mL/min — ABNORMAL LOW (ref >60)
GFR calc non Af Amer: 45 mL/min — ABNORMAL LOW (ref >60)
Glucose, Bld: 107 mg/dL — ABNORMAL HIGH (ref 70–99)
Glucose, Bld: 140 mg/dL — ABNORMAL HIGH (ref 70–99)
Glucose, Bld: 121 mg/dL — ABNORMAL HIGH (ref 70–99)
Glucose, Bld: 121 mg/dL — ABNORMAL HIGH (ref 70–99)
Glucose, Bld: 149 mg/dL — ABNORMAL HIGH (ref 70–99)
Potassium: 4 mmol/L (ref 3.5–5.1)
Potassium: 3.6 mmol/L (ref 3.5–5.1)
Potassium: 3.4 mmol/L — ABNORMAL LOW (ref 3.5–5.1)
Potassium: 3.2 mmol/L — ABNORMAL LOW (ref 3.5–5.1)
Potassium: 3.6 mmol/L (ref 3.5–5.1)
Sodium: 160 mmol/L — ABNORMAL HIGH (ref 135–145)
Sodium: 159 mmol/L — ABNORMAL HIGH (ref 135–145)
Sodium: 159 mmol/L — ABNORMAL HIGH (ref 135–145)
Sodium: 159 mmol/L — ABNORMAL HIGH (ref 135–145)
Sodium: 157 mmol/L — ABNORMAL HIGH (ref 135–145)

## 2019-12-01 LAB — CBC WITH DIFFERENTIAL/PLATELET
Abs Immature Granulocytes: 0.02 10*3/uL (ref 0.00–0.07)
Basophils Absolute: 0.1 10*3/uL (ref 0.0–0.1)
Basophils Relative: 1 %
Eosinophils Absolute: 0.2 10*3/uL (ref 0.0–0.5)
Eosinophils Relative: 3 %
HCT: 35.3 % — ABNORMAL LOW (ref 36.0–46.0)
Hemoglobin: 11 g/dL — ABNORMAL LOW (ref 12.0–15.0)
Immature Granulocytes: 0 %
Lymphocytes Relative: 19 %
Lymphs Abs: 1.5 10*3/uL (ref 0.7–4.0)
MCH: 33.1 pg (ref 26.0–34.0)
MCHC: 31.2 g/dL (ref 30.0–36.0)
MCV: 106.3 fL — ABNORMAL HIGH (ref 80.0–100.0)
Monocytes Absolute: 0.8 10*3/uL (ref 0.1–1.0)
Monocytes Relative: 10 %
Neutro Abs: 5.3 10*3/uL (ref 1.7–7.7)
Neutrophils Relative %: 67 %
Platelets: 83 10*3/uL — ABNORMAL LOW (ref 150–400)
RBC: 3.32 MIL/uL — ABNORMAL LOW (ref 3.87–5.11)
RDW: 16.7 % — ABNORMAL HIGH (ref 11.5–15.5)
WBC: 7.8 10*3/uL (ref 4.0–10.5)
nRBC: 0 % (ref 0.0–0.2)

## 2019-12-01 LAB — MAGNESIUM: Magnesium: 2.4 mg/dL (ref 1.7–2.4)

## 2019-12-01 MED ORDER — DEXTROSE 10 % IV SOLN: INTRAVENOUS | Status: DC

## 2019-12-01 NOTE — Plan of Care (Signed)
  Problem: Education: Goal: Knowledge of disease and its progression will improve Outcome: Progressing   Problem: Health Behavior/Discharge Planning: Goal: Ability to manage health-related needs will improve Outcome: Progressing   Problem: Clinical Measurements: Goal: Complications related to the disease process or treatment will be avoided or minimized Outcome: Progressing Goal: Dialysis access will remain free of complications Outcome: Progressing   Problem: Activity: Goal: Activity intolerance will improve Outcome: Progressing   Problem: Fluid Volume: Goal: Fluid volume balance will be maintained or improved Outcome: Progressing   Problem: Nutritional: Goal: Ability to make appropriate dietary choices will improve Outcome: Progressing   Problem: Respiratory: Goal: Respiratory symptoms related to disease process will be avoided Outcome: Progressing   Problem: Self-Concept: Goal: Body image disturbance will be avoided or minimized Outcome: Progressing   Problem: Urinary Elimination: Goal: Progression of disease will be identified and treated Outcome: Progressing   Problem: Nutrition Goal: Patient maintains adequate hydration Outcome: Progressing Goal: Patient maintains weight Outcome: Progressing Goal: Patient/Family demonstrates understanding of diet Outcome: Progressing Goal: Patient/Family independently completes tube feeding Outcome: Progressing Goal: Patient will have no more than 5 lb weight change during LOS Outcome: Progressing Goal: Patient will utilize adaptive techniques to administer nutrition Outcome: Progressing Goal: Patient will verbalize dietary restrictions Outcome: Progressing

## 2019-12-01 NOTE — Progress Notes (Signed)
SLP Late Entry: Note entered for Verlon Setting, SLP    11/29/19 1455  SLP Visit Information  SLP Received On 11/29/19  General Information  HPI 86 y.o. female with medical history significant of dementia, hypertension, HLD, sarcoidosis with no pulmonary involvement, recent seizure, osteoporosis, stroke, cirrhosis and admitted for dehydration hypernatremia and AKI with concomitant bilateral pleural effusions. CXR Small to moderate sized bilateral pleural effusions, right greater than left, mild bilateral lower lobes atelectasis and/or infiltrate. RN reports oral holding of food. Per RN pt is a ward of the state.  Type of Study Bedside Swallow Evaluation  Previous Swallow Assessment none  Diet Prior to this Study Dysphagia 3 (soft);Thin liquids  Temperature Spikes Noted No  Respiratory Status Nasal cannula  History of Recent Intubation No  Behavior/Cognition Alert;Cooperative;Confused;Requires cueing  Oral Cavity Assessment WFL  Oral Care Completed by SLP No  Oral Cavity - Dentition Dentures, top;Dentures, bottom  Vision Functional for self-feeding  Self-Feeding Abilities Total assist  Patient Positioning Upright in bed  Baseline Vocal Quality  (no phonation)  Volitional Cough Cognitively unable to elicit  Volitional Swallow Unable to elicit  Pain Assessment  Pain Assessment Faces  Faces Pain Scale 2  Pain Intervention(s) Monitored during session;Repositioned  Oral Assessment (Complete on admission/transfer/change in patient condition)  Does patient have any of the following "high(er) risk" factors? None of the above  Does patient have any of the following "at risk" factors? Other - dysphagia  Patient is AT RISK Order set for Adult Oral Care Protocol initiated -  "At Risk Patients" option selected (see row information)  Oral Motor/Sensory Function  Overall Oral Motor/Sensory Function Mild impairment  Facial ROM Reduced left  Facial Symmetry Abnormal symmetry left  Ice Chips  Ice  chips NT  Thin Liquid  Thin Liquid Impaired  Presentation Straw  Oral Phase Impairments Reduced labial seal;Reduced lingual movement/coordination;Poor awareness of bolus  Oral Phase Functional Implications Oral holding;Other (comment) (labial residue)  Pharyngeal  Phase Impairments Cough - Immediate;Suspected delayed Swallow;Decreased hyoid-laryngeal movement  Nectar Thick Liquid  Nectar Thick Liquid NT  Honey Thick Liquid  Honey Thick Liquid NT  Puree  Puree Impaired  Presentation Spoon  Oral Phase Impairments Reduced labial seal;Reduced lingual movement/coordination;Poor awareness of bolus  Oral Phase Functional Implications Oral holding;Oral residue  Solid  Solid NT  SLP - End of Session  Patient left in bed  Nurse Communication Diet recommendation;Swallow strategies reviewed  SLP Assessment  Clinical Impression Statement (ACUTE ONLY) Pt heard coughing prior to entering room, reclined, asleep and eventually aroused for po's during swallow assessment. Basic commands followed to open mouth revealing upper and lower dentures. She was unable to answer questions and appears to have mild left facial droop. + s/s aspiration with thin marked by immediate cough with 75% liquid trials given via straw. She held all boluses in oral cavity, in particular purees not significantly improved with dry spoon presentation and pressure on tongue to initate second swallow. Therapist downgraded texture to Dys 1 (puree) and nectar thick liquids. No recommendation for MBS at this time. Pt heard coughing in room as therapist documents and coughing prior as well with probable aspiration of secretions. Risk of dehydration in elderly with thick liquids if pt's refuse to consume them. Appears to be a Palliative situation and goals of care not known to this therapist. ST will follow short term.        SLP Visit Diagnosis Dysphagia, unspecified (R13.10)  Impact on safety and function Moderate aspiration risk;Severe  aspiration risk  Other Related Risk Factors Cognitive impairment  Swallow Evaluation Recommendations  SLP Diet Recommendations Dysphagia 1 (Puree);Nectar-thick liquid  Liquid Administration via Straw;Cup  Medication Administration Crushed with puree  Supervision Staff to assist with self feeding;Full supervision/cueing for compensatory strategies  Compensations Minimize environmental distractions;Slow rate;Small sips/bites;Lingual sweep for clearance of pocketing  Postural Changes Seated upright at 90 degrees  Treatment Plan  Oral Care Recommendations Oral care BID  Other Recommendations Order thickener from pharmacy  Treatment Recommendations Therapy as outlined in treatment plan below  Follow up Recommendations Skilled Nursing facility  Speech Therapy Frequency (ACUTE ONLY) min 2x/week  Treatment Duration 2 weeks  Interventions Diet toleration management by SLP  Prognosis  Prognosis for Safe Diet Advancement  (fair-guarded)  Barriers to Reach Goals Cognitive deficits;Severity of deficits  Individuals Consulted  Consulted and Agree with Results and Recommendations Patient unable/family or caregiver not available;RN  SLP Time Calculation  SLP Start Time (ACUTE ONLY) 1459  SLP Stop Time (ACUTE ONLY) 1522  SLP Time Calculation (min) (ACUTE ONLY) 23 min  SLP Evaluations  $ SLP Speech Visit 1 Visit  SLP Evaluations  $BSS Swallow 1 Procedure

## 2019-12-01 NOTE — Progress Notes (Signed)
Occupational Therapy Treatment Patient Details Name: Kelly Cross MRN: [id number] DOB: July 02, 1926 Today's Date: 12/01/2019    History of present illness 86 y.o. female with medical history significant of dementia, hypertension, HLD, sarcoidosis with no pulmonary involvement, recent seizure, osteoporosis, stroke, cirrhosis and admitted for dehydration hypernatremia and AKI with concomitant bilateral pleural effusions   OT comments  Patient more alert today with three verbalizations. Patient able to follow simple commands consistently with increased time. Patient mod assist to transfer to right side of bed/ Patient able to sit at edge of bed to wash face and take bites of food and a sip of juice. Patient mod assist for squat pivot to recliner with elevated bed surface.    Follow Up Recommendations  SNF    Equipment Recommendations  None recommended by OT    Recommendations for Other Services      Precautions / Restrictions Precautions Precautions: Fall Precaution Comments: colostomy, aspiration Restrictions Weight Bearing Restrictions: No       Mobility Bed Mobility Overal bed mobility: Needs Assistance       Supine to sit: Mod assist;HOB elevated     General bed mobility comments: Mod assist to transfer to side of bed - needing assistance for trunk negotiation.  Transfers Overall transfer level: Needs assistance   Transfers: Sit to/from Stand Sit to Stand: Mod assist;From elevated surface         General transfer comment: Mod assist to squat pivot from elevated bed height to recliner chair. Patient did not fully extend knees to come into stand inwtih transfer. Patient able to assist with scooting back into recliner with mod assist and verbal cues.    Balance Overall balance assessment: Needs assistance Sitting-balance support: No upper extremity supported;Feet supported Sitting balance-Leahy Scale: Fair     Standing balance support: During functional  activity Standing balance-Leahy Scale: Poor                             ADL either performed or assessed with clinical judgement   ADL   Eating/Feeding: Sitting;Moderate assistance Eating/Feeding Details (indicate cue type and reason): At side of bed patient able to hold magic cup with left hand and spoon food x 2 with right hand. Needed assistance to drink thickened juice - to get cup high enough and tilt cup. Grooming: Sitting;Set up;Cueing for sequencing Grooming Details (indicate cue type and reason): Patient able to wash lower face with set up and verbal cue.                                     Vision       Perception     Praxis      Cognition Arousal/Alertness: Awake/alert Behavior During Therapy: Flat affect Overall Cognitive Status: History of cognitive impairments - at baseline                                 General Comments: per chart, little verbalizations at baseline        Exercises     Shoulder Instructions       General Comments      Pertinent Vitals/ Pain       Pain Assessment: Faces Faces Pain Scale: No hurt  Home Living  Prior Functioning/Environment              Frequency  Min 2X/week        Progress Toward Goals  OT Goals(current goals can now be found in the care plan section)  Progress towards OT goals: Progressing toward goals  Acute Rehab OT Goals OT Goal Formulation: Patient unable to participate in goal setting Time For Goal Achievement: 12/13/19 Potential to Achieve Goals: Good  Plan Discharge plan remains appropriate    Co-evaluation                 AM-PAC OT "6 Clicks" Daily Activity     Outcome Measure   Help from another person eating meals?: A Lot Help from another person taking care of personal grooming?: A Little Help from another person toileting, which includes using toliet, bedpan, or urinal?:  Total Help from another person bathing (including washing, rinsing, drying)?: A Lot Help from another person to put on and taking off regular upper body clothing?: A Lot Help from another person to put on and taking off regular lower body clothing?: Total 6 Click Score: 11    End of Session Equipment Utilized During Treatment: Gait belt  OT Visit Diagnosis: Unsteadiness on feet (R26.81);Muscle weakness (generalized) (M62.81)   Activity Tolerance Patient tolerated treatment well   Patient Left in chair;with call bell/phone within reach;with chair alarm set;with nursing/sitter in room   Nurse Communication Mobility status        Time: 4098-1191 OT Time Calculation (min): 25 min  Charges: OT General Charges $OT Visit: 1 Visit OT Treatments $Self Care/Home Management : 8-22 mins $Therapeutic Activity: 8-22 mins  Arlo Buffone, OTR/L Acute Care Rehab Services  Office [phone] Pager: [phone]    Kelli Churn 12/01/2019, 1:14 PM

## 2019-12-01 NOTE — Progress Notes (Signed)
  Speech Language Pathology Treatment: Dysphagia  Patient Details Name: Kelly Cross MRN: [id number] DOB: 30-Aug-1926 Today's Date: 12/01/2019 Time: 1062-6948 SLP Time Calculation (min) (ACUTE ONLY): 12 min  Assessment / Plan / Recommendation Clinical Impression  Pt has significant oral holding, at times holding boluses in her mouth for greater than 60 seconds while given Max cues from SLP. Coughing is noted before, during, and after PO trials and therefore makes it difficult to discern whether this may be related to PO trials or not. Agree with evaluating SLP that it could also be concerning for decreased secretion management in light of overall presentation. SLP provided oral suction with fairly large amounts of residue removed from her oral cavity. In addition to risk for aspiration, she also appears to be at risk for inadequate nutrition and hydration. Would continue to address overall GOC and palliative involvement.    HPI HPI: 86 y.o. female with medical history significant of dementia, hypertension, HLD, sarcoidosis with no pulmonary involvement, recent seizure, osteoporosis, stroke, cirrhosis and admitted for dehydration hypernatremia and AKI with concomitant bilateral pleural effusions. CXR Small to moderate sized bilateral pleural effusions, right greater than left, mild bilateral lower lobes atelectasis and/or infiltrate. RN reports oral holding of food. Per RN pt is a ward of the state.      SLP Plan  Continue with current plan of care       Recommendations  Diet recommendations: Dysphagia 1 (puree);Nectar-thick liquid Liquids provided via: Cup;Straw Medication Administration: Crushed with puree Supervision: Staff to assist with self feeding;Full supervision/cueing for compensatory strategies Compensations: Minimize environmental distractions;Slow rate;Small sips/bites;Lingual sweep for clearance of pocketing Postural Changes and/or Swallow Maneuvers: Seated upright 90  degrees;Upright 30-60 min after meal                Oral Care Recommendations: Oral care BID Follow up Recommendations: Skilled Nursing facility SLP Visit Diagnosis: Dysphagia, unspecified (R13.10) Plan: Continue with current plan of care       GO                Mahala Menghini., M.A. CCC-SLP Acute Rehabilitation Services Pager [phone] Office [phone]  12/01/2019, 10:25 AM

## 2019-12-01 NOTE — Plan of Care (Signed)
  Problem: Education: Goal: Knowledge of disease and its progression will improve Outcome: Progressing   Problem: Health Behavior/Discharge Planning: Goal: Ability to manage health-related needs will improve Outcome: Progressing   Problem: Clinical Measurements: Goal: Complications related to the disease process or treatment will be avoided or minimized Outcome: Progressing Goal: Dialysis access will remain free of complications Outcome: Progressing   Problem: Activity: Goal: Activity intolerance will improve Outcome: Progressing   Problem: Fluid Volume: Goal: Fluid volume balance will be maintained or improved Outcome: Progressing   Problem: Nutritional: Goal: Ability to make appropriate dietary choices will improve Outcome: Progressing   Problem: Respiratory: Goal: Respiratory symptoms related to disease process will be avoided Outcome: Progressing   Problem: Self-Concept: Goal: Body image disturbance will be avoided or minimized Outcome: Progressing   Problem: Urinary Elimination: Goal: Progression of disease will be identified and treated Outcome: Progressing   Problem: Nutrition Goal: Patient maintains adequate hydration Outcome: Progressing Goal: Patient maintains weight Outcome: Progressing Goal: Patient/Family demonstrates understanding of diet Outcome: Progressing Goal: Patient/Family independently completes tube feeding Outcome: Progressing Goal: Patient will have no more than 5 lb weight change during LOS Outcome: Progressing Goal: Patient will utilize adaptive techniques to administer nutrition Outcome: Progressing Goal: Patient will verbalize dietary restrictions Outcome: Progressing   

## 2019-12-01 NOTE — Progress Notes (Signed)
PROGRESS NOTE    Kelly Cross   UUV:[id number]  DOB: [birth year]  DOA: 11/28/2019     3  PCP: Leeroy Cha, MD  CC: lethargy, hypernatremia  Hospital Course: Ms. Granville is a 14 AA female with PMH dementia, hypertension, hyperlipidemia, sarcoidosis with no pulmonary involvement, seizure disorder, osteoporosis, CVA, Rectal prolapse status post rectopexy + colostomy creation 2019 who presented with hypernatremia, sodium 158.  She had also been having reported decreased oral intake.  Her baseline is that she is rarely verbal and has significant dementia per nursing staff at [facility].  The patient was more lethargic than usual and was referred to the ER for further evaluation and treatment.  Other history includes: In May patient was admitted to [facility] with seizures felt to be secondary to hypoglycemia as well as prior CVAs in the Alzheimer's induced epilepsy she undergone further evaluation including CT MRI and EEG and was discharged back to facility on Keppra 750 twice daily and her donepezil was discontinued Her hypoglycemia at that time felt to be secondary to accidental overdose with glipizide as her hypoglycemic panel was positive for glipizide She was also noted to have elevated LFTs in June 2021. AST was 121 ALT 193 CT scan of the liver done at that time showed possibility of early cirrhosis With diffuse fatty infiltration of the liver and gallstones early cirrhosis cirrhosis was confirmed with ultrasound of the liver as well.  She was admitted for treatment of her hypernatremia and placed on D5 LR infusion.   Interval History:  No events overnight.  She is actually more awake and alert today.  Sitting up in chair at bedside and able to nod and speak a few words to questions as well as follow some commands.  Old records reviewed in assessment of this patient  ROS: Review of systems not obtained due to patient factors.  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  This seems to be her current state as well -will continue monitoring mentation while correcting hypernatremia, but if electrolyte abnormalities correct with no significant change in mentation, this is likely her baseline  AKI (acute kidney injury) ([place]) -Continue fluids and trend BMP - renal function responding to IVF; continue   Chronic systolic CHF (congestive heart failure) (HCC) -EF 35 to 40% on April 2021 echo -Monitor for any signs of volume overload while on fluids  Hypernatremia - refractory to D5LR; will add on D10 and monitor BMP q4h then de-escalate as able - mentation has improved some since yesterday despite no change in Na  Seizure disorder (HCC) -Continue Keppra  Anemia - iron levels adequate; no further workup at this time   Antimicrobials: n/a  DVT prophylaxis: HSQ Code Status: DNR Family Communication: none Disposition Plan:  . Patient came from: SNF        . Barriers to d/c OR conditions which need to be met to effect a safe d/c: none currently . The current disposition plan is discharge to: SNF   Objective: Vitals:   11/30/19 2101 12/01/19 0439 12/01/19 1003 12/01/19 1419  BP: 91/62 134/87  117/68  Pulse: 72 81  64  Resp: '20 19  18  '$ Temp: 98.6 F (37 C) 98.7 F (37.1 C)  97.8 F (36.6 C)  TempSrc:    Oral  SpO2: 97% 100% 91% 98%  Weight:  Height:        Intake/Output Summary (Last 24 hours) at 12/01/2019 1435 Last data filed at 12/01/2019 1000 Gross per 24 hour  Intake 1106.1 ml  Output 900 ml  Net 206.1 ml   Filed Weights   11/28/19 2210  Weight: 57.2 kg    Examination: General appearance: Little more awake and alert, following some commands Head: Normocephalic, without obvious abnormality Eyes: Obvious protrusion of eyeballs almost  exophthalmos Lungs: clear to auscultation bilaterally Heart: regular rate and rhythm and S1, S2 normal Abdomen: normal findings: bowel sounds normal Extremities: No edema Skin: mobility and turgor normal Neurologic: Moves all 4 extremities spontaneously.  More verbal today and following some commands  Consultants:   N/A  Procedures:   N/A  Data Reviewed: I have personally reviewed following labs and imaging studies Results for orders placed or performed during the hospital encounter of 11/28/19 (from the past 72 hour(s))  Blood gas, venous     Status: Abnormal   Collection Time: 11/28/19  5:02 PM  Result Value Ref Range   pH, Ven 7.363 7.25 - 7.43   pCO2, Ven 40.3 (L) 44 - 60 mmHg   pO2, Ven <31.0 (LL) 32 - 45 mmHg    Comment: CRITICAL RESULT CALLED TO, READ BACK BY AND VERIFIED WITH: SAVOIE,B. RN '@1734'$  11/28/19 BILLINGSLEY,L    Bicarbonate 22.4 20.0 - 28.0 mmol/L   Acid-base deficit 2.3 (H) 0.0 - 2.0 mmol/L   O2 Saturation 24.9 %   Patient temperature 98.6    Sample type VENOUS     Comment: Performed at [facility], [place] [address] [place], [place] [address]  CBC with Differential/Platelet     Status: Abnormal   Collection Time: 11/28/19  5:03 PM  Result Value Ref Range   WBC 8.6 4.0 - 10.5 K/uL   RBC 3.40 (L) 3.87 - 5.11 MIL/uL   Hemoglobin 11.1 (L) 12.0 - 15.0 g/dL   HCT 35.5 (L) 36 - 46 %   MCV 104.4 (H) 80.0 - 100.0 fL   MCH 32.6 26.0 - 34.0 pg   MCHC 31.3 30.0 - 36.0 g/dL   RDW 16.4 (H) 11.5 - 15.5 %   Platelets 114 (L) 150 - 400 K/uL    Comment: SPECIMEN CHECKED FOR CLOTS Immature Platelet Fraction may be clinically indicated, consider ordering this additional test [id number] PLATELET COUNT CONFIRMED BY SMEAR    nRBC 0.0 0.0 - 0.2 %   Neutrophils Relative % 75 %   Neutro Abs 6.5 1.7 - 7.7 K/uL   Lymphocytes Relative 16 %   Lymphs Abs 1.4 0.7 - 4.0 K/uL   Monocytes Relative 9 %   Monocytes Absolute 0.7 0 - 1 K/uL   Eosinophils Relative  0 %   Eosinophils Absolute 0.0 0 - 0 K/uL   Basophils Relative 0 %   Basophils Absolute 0.0 0 - 0 K/uL   Immature Granulocytes 0 %   Abs Immature Granulocytes 0.03 0.00 - 0.07 K/uL   Burr Cells PRESENT    Polychromasia PRESENT     Comment: Performed at [facility], [place] [address], [place], [place] [address]  Comprehensive metabolic panel     Status: Abnormal   Collection Time: 11/28/19  5:03 PM  Result Value Ref Range   Sodium 159 (H) 135 - 145 mmol/L   Potassium 3.9 3.5 - 5.1 mmol/L   Chloride 126 (H) 98 - 111 mmol/L   CO2 24 22 - 32 mmol/L   Glucose, Bld  133 (H) 70 - 99 mg/dL    Comment: Glucose reference range applies only to samples taken after fasting for at least 8 hours.   BUN 49 (H) 8 - 23 mg/dL   Creatinine, Ser 1.53 (H) 0.44 - 1.00 mg/dL   Calcium 8.9 8.9 - 10.3 mg/dL   Total Protein 7.0 6.5 - 8.1 g/dL   Albumin 3.4 (L) 3.5 - 5.0 g/dL   AST 168 (H) 15 - 41 U/L   ALT 166 (H) 0 - 44 U/L   Alkaline Phosphatase 253 (H) 38 - 126 U/L   Total Bilirubin 1.1 0.3 - 1.2 mg/dL   GFR calc non Af Amer 30 (L) >60 mL/min   GFR calc Af Amer 34 (L) >60 mL/min   Anion gap 9 5 - 15    Comment: Performed at [facility], [place] [address], [place], [place] [address]  Osmolality     Status: Abnormal   Collection Time: 11/28/19  5:03 PM  Result Value Ref Range   Osmolality 350 (HH) 275 - 295 mOsm/kg    Comment: REPEATED TO VERIFY CRITICAL RESULT CALLED TO, READ BACK BY AND VERIFIED WITH: RICHY BALIGOD RN.'@2220'$  ON 7.5.2021 BY TCALDWELL MT. Performed at [facility], [place] [address] [place], [place] [address]   Troponin I (High Sensitivity)     Status: Abnormal   Collection Time: 11/28/19  5:03 PM  Result Value Ref Range   Troponin I (High Sensitivity) 151 (HH) <18 ng/L    Comment: CRITICAL RESULT CALLED TO, READ BACK BY AND VERIFIED WITH: GARRISON,G. RN '@1829'$  11/28/19 BILLINGSLEY,L (NOTE) Elevated high sensitivity troponin I (hsTnI) values and  significant  changes across serial measurements may suggest ACS but many other  chronic and acute conditions are known to elevate hsTnI results.  Refer to the Links section for chest pain algorithms and additional  guidance. Performed at [facility], [place] [address], [place], [place] [address]   Brain natriuretic peptide     Status: Abnormal   Collection Time: 11/28/19  5:03 PM  Result Value Ref Range   B Natriuretic Peptide >4,500.0 (H) 0.0 - 100.0 pg/mL    Comment: Performed at [facility], [place] [address] [place], [place] [address]  SARS Coronavirus 2 by RT PCR (hospital order, performed in [facility] hospital lab) Nasopharyngeal Nasopharyngeal Swab     Status: None   Collection Time: 11/28/19  6:30 PM   Specimen: Nasopharyngeal Swab  Result Value Ref Range   SARS Coronavirus 2 NEGATIVE NEGATIVE    Comment: (NOTE) SARS-CoV-2 target nucleic acids are NOT DETECTED.  The SARS-CoV-2 RNA is generally detectable in upper and lower respiratory specimens during the acute phase of infection. The lowest concentration of SARS-CoV-2 viral copies this assay can detect is 250 copies / mL. A negative result does not preclude SARS-CoV-2 infection and should not be used as the sole basis for treatment or other patient management decisions.  A negative result may occur with improper specimen collection / handling, submission of specimen other than nasopharyngeal swab, presence of viral mutation(s) within the areas targeted by this assay, and inadequate number of viral copies (<250 copies / mL). A negative result must be combined with clinical observations, patient history, and epidemiological information.  Fact Sheet for Patients:   StrictlyIdeas.no  Fact Sheet for Healthcare Providers: BankingDealers.co.za  This test is not yet approved or  cleared by the [place] FDA and has been authorized for  detection and/or diagnosis  of SARS-CoV-2 by FDA under an Emergency Use Authorization (EUA).  This EUA will remain in effect (meaning this test can be used) for the duration of the COVID-19 declaration under Section 564(b)(1) of the Act, 21 U.S.C. section 360bbb-3(b)(1), unless the authorization is terminated or revoked sooner.  Performed at [facility], [place] [address], [place], [place] [address]   Troponin I (High Sensitivity)     Status: Abnormal   Collection Time: 11/28/19  7:35 PM  Result Value Ref Range   Troponin I (High Sensitivity) 171 (HH) <18 ng/L    Comment: CRITICAL RESULT CALLED TO, READ BACK BY AND VERIFIED WITH: HODGES,I. RN '@2045'$  11/28/19 BILLINGSLEY,L (NOTE) Elevated high sensitivity troponin I (hsTnI) values and significant  changes across serial measurements may suggest ACS but many other  chronic and acute conditions are known to elevate hsTnI results.  Refer to the Links section for chest pain algorithms and additional  guidance. Performed at [facility], [place] [address], [place], [place] [address]   Lactic acid, plasma     Status: Abnormal   Collection Time: 11/28/19  7:35 PM  Result Value Ref Range   Lactic Acid, Venous 2.5 (HH) 0.5 - 1.9 mmol/L    Comment: CRITICAL RESULT CALLED TO, READ BACK BY AND VERIFIED WITH: HODGES,I. RN '@2032'$  11/28/19 BILLINGSLEY,L Performed at [facility], [place] [address], [place], [place] [address]   Ammonia     Status: Abnormal   Collection Time: 11/28/19  7:35 PM  Result Value Ref Range   Ammonia 40 (H) 9 - 35 umol/L    Comment: Performed at [facility], [place] [address], [place], [place] [address]  Magnesium     Status: Abnormal   Collection Time: 11/28/19  7:35 PM  Result Value Ref Range   Magnesium 2.5 (H) 1.7 - 2.4 mg/dL    Comment: Performed at [facility], [place] [address], [place], [place] [address]  Phosphorus     Status:  Abnormal   Collection Time: 11/28/19  7:35 PM  Result Value Ref Range   Phosphorus 6.0 (H) 2.5 - 4.6 mg/dL    Comment: Performed at [facility], [place] [address] [place], [place] [address]  CK     Status: None   Collection Time: 11/28/19  7:35 PM  Result Value Ref Range   Total CK 145 38.0 - 234.0 U/L    Comment: Performed at [facility], [place] [address] [place], [place] [address]  Lactic acid, plasma     Status: None   Collection Time: 11/28/19  9:31 PM  Result Value Ref Range   Lactic Acid, Venous 1.7 0.5 - 1.9 mmol/L    Comment: Performed at [facility], [place] [address], [place], [place] [address]  Basic metabolic panel     Status: Abnormal   Collection Time: 11/28/19 11:52 PM  Result Value Ref Range   Sodium 158 (H) 135 - 145 mmol/L   Potassium 4.1 3.5 - 5.1 mmol/L   Chloride 125 (H) 98 - 111 mmol/L   CO2 22 22 - 32 mmol/L   Glucose, Bld 114 (H) 70 - 99 mg/dL    Comment: Glucose reference range applies only to samples taken after fasting for at least 8 hours.   BUN 56 (H) 8 - 23 mg/dL   Creatinine, Ser 1.64 (H) 0.44 - 1.00 mg/dL   Calcium 8.9 8.9 - 10.3 mg/dL   GFR calc non Af  Amer 27 (L) >60 mL/min   GFR calc Af Amer 32 (L) >60 mL/min   Anion gap 11 5 - 15    Comment: Performed at [facility], [place] [address] [place], [place] [address]  Lactic acid, plasma     Status: Abnormal   Collection Time: 11/28/19 11:52 PM  Result Value Ref Range   Lactic Acid, Venous 2.0 (HH) 0.5 - 1.9 mmol/L    Comment: CRITICAL RESULT CALLED TO, READ BACK BY AND VERIFIED WITH: RN RICCI AT 8786 11/29/19 CRUICKSHANK A Performed at [facility], [place] [address], [place], [place] [address]   Troponin I (High Sensitivity)     Status: Abnormal   Collection Time: 11/28/19 11:52 PM  Result Value Ref Range   Troponin I (High Sensitivity) 153 (HH) <18 ng/L    Comment: CRITICAL VALUE NOTED.  VALUE IS CONSISTENT  WITH PREVIOUSLY REPORTED AND CALLED VALUE. (NOTE) Elevated high sensitivity troponin I (hsTnI) values and significant  changes across serial measurements may suggest ACS but many other  chronic and acute conditions are known to elevate hsTnI results.  Refer to the Links section for chest pain algorithms and additional  guidance. Performed at [facility], [place] [address] [place], [place] [address]   Troponin I (High Sensitivity)     Status: Abnormal   Collection Time: 11/29/19  2:40 AM  Result Value Ref Range   Troponin I (High Sensitivity) 147 (HH) <18 ng/L    Comment: CRITICAL VALUE NOTED.  VALUE IS CONSISTENT WITH PREVIOUSLY REPORTED AND CALLED VALUE. (NOTE) Elevated high sensitivity troponin I (hsTnI) values and significant  changes across serial measurements may suggest ACS but many other  chronic and acute conditions are known to elevate hsTnI results.  Refer to the Links section for chest pain algorithms and additional  guidance. Performed at [facility], [place] [address], [place], [place] [address]   Magnesium     Status: Abnormal   Collection Time: 11/29/19  2:40 AM  Result Value Ref Range   Magnesium 2.6 (H) 1.7 - 2.4 mg/dL    Comment: Performed at [facility], [place] [address] [place], [place] [address]  Phosphorus     Status: Abnormal   Collection Time: 11/29/19  2:40 AM  Result Value Ref Range   Phosphorus 5.8 (H) 2.5 - 4.6 mg/dL    Comment: Performed at [facility], [place] [address] [place], [place] [address]  CBC WITH DIFFERENTIAL     Status: Abnormal   Collection Time: 11/29/19  2:40 AM  Result Value Ref Range   WBC 9.1 4.0 - 10.5 K/uL   RBC 3.23 (L) 3.87 - 5.11 MIL/uL   Hemoglobin 11.0 (L) 12.0 - 15.0 g/dL   HCT 34.6 (L) 36 - 46 %   MCV 107.1 (H) 80.0 - 100.0 fL   MCH 34.1 (H) 26.0 - 34.0 pg   MCHC 31.8 30.0 - 36.0 g/dL   RDW 16.4 (H) 11.5 - 15.5 %   Platelets 100 (L) 150 - 400  K/uL    Comment: Immature Platelet Fraction may be clinically indicated, consider ordering this additional test [id number]    nRBC 0.0 0.0 - 0.2 %   Neutrophils Relative % 75 %   Neutro Abs 6.8 1.7 - 7.7 K/uL   Lymphocytes Relative 17 %   Lymphs Abs 1.5 0.7 - 4.0 K/uL   Monocytes Relative 8 %   Monocytes Absolute 0.7 0 - 1 K/uL  Eosinophils Relative 0 %   Eosinophils Absolute 0.0 0 - 0 K/uL   Basophils Relative 0 %   Basophils Absolute 0.0 0 - 0 K/uL   Immature Granulocytes 0 %   Abs Immature Granulocytes 0.03 0.00 - 0.07 K/uL    Comment: Performed at [facility], [place] [address], [place], [place] [address]  TSH     Status: None   Collection Time: 11/29/19  2:40 AM  Result Value Ref Range   TSH 0.401 0.350 - 4.500 uIU/mL    Comment: Performed by a 3rd Generation assay with a functional sensitivity of <=0.01 uIU/mL. Performed at [facility], [place] [address] [place], [place] [address]   Comprehensive metabolic panel     Status: Abnormal   Collection Time: 11/29/19  2:40 AM  Result Value Ref Range   Sodium 161 (HH) 135 - 145 mmol/L    Comment: CRITICAL RESULT CALLED TO, READ BACK BY AND VERIFIED WITH: RN RICCI AT 9470 11/29/19 CRUICKSHANK A    Potassium 3.9 3.5 - 5.1 mmol/L   Chloride 125 (H) 98 - 111 mmol/L   CO2 22 22 - 32 mmol/L   Glucose, Bld 122 (H) 70 - 99 mg/dL    Comment: Glucose reference range applies only to samples taken after fasting for at least 8 hours.   BUN 53 (H) 8 - 23 mg/dL   Creatinine, Ser 1.54 (H) 0.44 - 1.00 mg/dL   Calcium 9.0 8.9 - 10.3 mg/dL   Total Protein 6.6 6.5 - 8.1 g/dL   Albumin 3.4 (L) 3.5 - 5.0 g/dL   AST 148 (H) 15 - 41 U/L   ALT 156 (H) 0 - 44 U/L   Alkaline Phosphatase 244 (H) 38 - 126 U/L   Total Bilirubin 1.2 0.3 - 1.2 mg/dL   GFR calc non Af Amer 29 (L) >60 mL/min   GFR calc Af Amer 34 (L) >60 mL/min   Anion gap 14 5 - 15    Comment: Performed at [facility], [place]  [address], [place], [place] [address]  Urinalysis, Routine w reflex microscopic     Status: Abnormal   Collection Time: 11/29/19  4:54 AM  Result Value Ref Range   Color, Urine YELLOW YELLOW   APPearance CLEAR CLEAR   Specific Gravity, Urine 1.020 1.005 - 1.030   pH 5.0 5.0 - 8.0   Glucose, UA NEGATIVE NEGATIVE mg/dL   Hgb urine dipstick NEGATIVE NEGATIVE   Bilirubin Urine NEGATIVE NEGATIVE   Ketones, ur NEGATIVE NEGATIVE mg/dL   Protein, ur 30 (A) NEGATIVE mg/dL   Nitrite NEGATIVE NEGATIVE   Leukocytes,Ua MODERATE (A) NEGATIVE   RBC / HPF 0-5 0 - 5 RBC/hpf   WBC, UA 6-10 0 - 5 WBC/hpf   Bacteria, UA RARE (A) NONE SEEN   Squamous Epithelial / LPF 0-5 0 - 5    Comment: Performed at [facility], [place] [address] [place], [place] [address]  Osmolality, urine     Status: None   Collection Time: 11/29/19  4:55 AM  Result Value Ref Range   Osmolality, Ur 708 300 - 900 mOsm/kg    Comment: PERFORMED AT [facility] Performed at [facility], [place] [address], [place], [place] [address]   Sodium, urine, random     Status: None   Collection Time: 11/29/19  4:55 AM  Result Value Ref Range   Sodium, Ur <10 mmol/L    Comment: Performed at [facility]  [place] [address] [place], [place] [address]  Creatinine, urine, random     Status: None   Collection Time: 11/29/19  4:55 AM  Result Value Ref Range   Creatinine, Urine 154.21 mg/dL    Comment: Performed at [facility], [place] [address], [place], [place] [address]  Basic metabolic panel     Status: Abnormal   Collection Time: 11/29/19  5:40 AM  Result Value Ref Range   Sodium 157 (H) 135 - 145 mmol/L   Potassium 3.5 3.5 - 5.1 mmol/L   Chloride 128 (H) 98 - 111 mmol/L   CO2 21 (L) 22 - 32 mmol/L   Glucose, Bld 127 (H) 70 - 99 mg/dL    Comment: Glucose reference range applies only to samples taken after fasting for at least 8 hours.   BUN 53 (H) 8 - 23 mg/dL   Creatinine,  Ser 1.45 (H) 0.44 - 1.00 mg/dL   Calcium 8.6 (L) 8.9 - 10.3 mg/dL   GFR calc non Af Amer 32 (L) >60 mL/min   GFR calc Af Amer 37 (L) >60 mL/min   Anion gap 8 5 - 15    Comment: Performed at [facility], [place] [address], [place], [place] [address]  Vitamin B12     Status: Abnormal   Collection Time: 11/29/19  5:40 AM  Result Value Ref Range   Vitamin B-12 1,414 (H) 180 - 914 pg/mL    Comment: (NOTE) This assay is not validated for testing neonatal or myeloproliferative syndrome specimens for Vitamin B12 levels. Performed at [facility], [place] [address], [place], [place] [address]   Folate     Status: None   Collection Time: 11/29/19  5:40 AM  Result Value Ref Range   Folate 26.4 >5.9 ng/mL    Comment: RESULTS CONFIRMED BY MANUAL DILUTION Performed at [place] [address], [place], [place] [address]   Iron and TIBC     Status: None   Collection Time: 11/29/19  5:40 AM  Result Value Ref Range   Iron 52 28 - 170 ug/dL   TIBC 299 250 - 450 ug/dL   Saturation Ratios 17 10.4 - 31.8 %   UIBC 247 ug/dL    Comment: Performed at [facility], [place] [address], [place], [place] [address]  Ferritin     Status: None   Collection Time: 11/29/19  5:40 AM  Result Value Ref Range   Ferritin 52 11 - 307 ng/mL    Comment: Performed at [facility] [place] [address], [place], [place] [address]  Reticulocytes     Status: Abnormal   Collection Time: 11/29/19  5:40 AM  Result Value Ref Range   Retic Ct Pct 2.4 0.4 - 3.1 %   RBC. 3.35 (L) 3.87 - 5.11 MIL/uL   Retic Count, Absolute 81.1 19.0 - 186.0 K/uL   Immature Retic Fract 26.9 (H) 2.3 - 15.9 %    Comment: Performed at [facility], [place] [address] [place], [place] [address]  Basic metabolic panel     Status: Abnormal   Collection Time: 11/29/19 12:13 PM  Result Value Ref Range   Sodium 159 (H) 135 - 145 mmol/L   Potassium  4.6 3.5 - 5.1 mmol/L    Comment: DELTA CHECK NOTED NO VISIBLE HEMOLYSIS    Chloride 127 (H) 98 - 111 mmol/L   CO2 20 (L) 22 - 32 mmol/L  Glucose, Bld 156 (H) 70 - 99 mg/dL    Comment: Glucose reference range applies only to samples taken after fasting for at least 8 hours.   BUN 54 (H) 8 - 23 mg/dL   Creatinine, Ser 1.48 (H) 0.44 - 1.00 mg/dL   Calcium 8.3 (L) 8.9 - 10.3 mg/dL   GFR calc non Af Amer 31 (L) >60 mL/min   GFR calc Af Amer 36 (L) >60 mL/min   Anion gap 12 5 - 15    Comment: Performed at [facility], [place] [address], [place], [place] [address]  Comprehensive metabolic panel     Status: Abnormal   Collection Time: 11/29/19  5:10 PM  Result Value Ref Range   Sodium 160 (H) 135 - 145 mmol/L   Potassium 4.1 3.5 - 5.1 mmol/L   Chloride 129 (H) 98 - 111 mmol/L   CO2 24 22 - 32 mmol/L   Glucose, Bld 110 (H) 70 - 99 mg/dL    Comment: Glucose reference range applies only to samples taken after fasting for at least 8 hours.   BUN 51 (H) 8 - 23 mg/dL   Creatinine, Ser 1.54 (H) 0.44 - 1.00 mg/dL   Calcium 8.7 (L) 8.9 - 10.3 mg/dL   Total Protein 6.2 (L) 6.5 - 8.1 g/dL   Albumin 3.0 (L) 3.5 - 5.0 g/dL   AST 119 (H) 15 - 41 U/L   ALT 132 (H) 0 - 44 U/L   Alkaline Phosphatase 225 (H) 38 - 126 U/L   Total Bilirubin 0.9 0.3 - 1.2 mg/dL   GFR calc non Af Amer 29 (L) >60 mL/min   GFR calc Af Amer 34 (L) >60 mL/min   Anion gap 7 5 - 15    Comment: Performed at [facility], [place] [address], [place], [place] [address]  CBC with Differential/Platelet     Status: Abnormal   Collection Time: 11/30/19  4:00 AM  Result Value Ref Range   WBC 8.9 4.0 - 10.5 K/uL   RBC 3.37 (L) 3.87 - 5.11 MIL/uL   Hemoglobin 11.2 (L) 12.0 - 15.0 g/dL   HCT 36.2 36 - 46 %   MCV 107.4 (H) 80.0 - 100.0 fL   MCH 33.2 26.0 - 34.0 pg   MCHC 30.9 30.0 - 36.0 g/dL   RDW 16.6 (H) 11.5 - 15.5 %   Platelets 96 (L) 150 - 400 K/uL    Comment: CONSISTENT WITH PREVIOUS  RESULT Immature Platelet Fraction may be clinically indicated, consider ordering this additional test [id number] REPEATED TO VERIFY    nRBC 0.0 0.0 - 0.2 %   Neutrophils Relative % 72 %   Neutro Abs 6.3 1.7 - 7.7 K/uL   Lymphocytes Relative 18 %   Lymphs Abs 1.6 0.7 - 4.0 K/uL   Monocytes Relative 9 %   Monocytes Absolute 0.8 0 - 1 K/uL   Eosinophils Relative 1 %   Eosinophils Absolute 0.1 0 - 0 K/uL   Basophils Relative 0 %   Basophils Absolute 0.0 0 - 0 K/uL   Immature Granulocytes 0 %   Abs Immature Granulocytes 0.04 0.00 - 0.07 K/uL    Comment: Performed at [place] [facility] [place] [address] [place], [place] [address]  Basic metabolic panel     Status: Abnormal   Collection Time: 11/30/19 10:51 AM  Result Value Ref Range   Sodium 159 (H) 135 - 145 mmol/L   Potassium 3.7 3.5 - 5.1 mmol/L  Chloride 128 (H) 98 - 111 mmol/L   CO2 23 22 - 32 mmol/L   Glucose, Bld 124 (H) 70 - 99 mg/dL    Comment: Glucose reference range applies only to samples taken after fasting for at least 8 hours.   BUN 40 (H) 8 - 23 mg/dL   Creatinine, Ser 1.26 (H) 0.44 - 1.00 mg/dL   Calcium 8.6 (L) 8.9 - 10.3 mg/dL   GFR calc non Af Amer 37 (L) >60 mL/min   GFR calc Af Amer 43 (L) >60 mL/min   Anion gap 8 5 - 15    Comment: Performed at [facility], [place] [address] [place], [place] [address]  CBC with Differential/Platelet     Status: Abnormal   Collection Time: 11/30/19 10:51 AM  Result Value Ref Range   WBC 8.7 4.0 - 10.5 K/uL   RBC 3.50 (L) 3.87 - 5.11 MIL/uL   Hemoglobin 11.8 (L) 12.0 - 15.0 g/dL   HCT 37.8 36 - 46 %   MCV 108.0 (H) 80.0 - 100.0 fL   MCH 33.7 26.0 - 34.0 pg   MCHC 31.2 30.0 - 36.0 g/dL   RDW 16.5 (H) 11.5 - 15.5 %   Platelets 94 (L) 150 - 400 K/uL    Comment: CONSISTENT WITH PREVIOUS RESULT Immature Platelet Fraction may be clinically indicated, consider ordering this additional test [id number]    nRBC 0.0 0.0 - 0.2 %   Neutrophils  Relative % 68 %   Neutro Abs 5.9 1.7 - 7.7 K/uL   Lymphocytes Relative 21 %   Lymphs Abs 1.8 0.7 - 4.0 K/uL   Monocytes Relative 8 %   Monocytes Absolute 0.7 0 - 1 K/uL   Eosinophils Relative 2 %   Eosinophils Absolute 0.2 0 - 0 K/uL   Basophils Relative 1 %   Basophils Absolute 0.1 0 - 0 K/uL   Immature Granulocytes 0 %   Abs Immature Granulocytes 0.03 0.00 - 0.07 K/uL    Comment: Performed at [facility], [place] [address] [place], [place] [address]  Basic metabolic panel     Status: Abnormal   Collection Time: 11/30/19  3:30 PM  Result Value Ref Range   Sodium 160 (H) 135 - 145 mmol/L   Potassium 3.5 3.5 - 5.1 mmol/L   Chloride 128 (H) 98 - 111 mmol/L   CO2 22 22 - 32 mmol/L   Glucose, Bld 148 (H) 70 - 99 mg/dL    Comment: Glucose reference range applies only to samples taken after fasting for at least 8 hours.   BUN 38 (H) 8 - 23 mg/dL   Creatinine, Ser 1.14 (H) 0.44 - 1.00 mg/dL   Calcium 8.6 (L) 8.9 - 10.3 mg/dL   GFR calc non Af Amer 42 (L) >60 mL/min   GFR calc Af Amer 49 (L) >60 mL/min   Anion gap 10 5 - 15    Comment: Performed at [facility], [place] [address], [place], [place] [address]  Basic metabolic panel     Status: Abnormal   Collection Time: 11/30/19 10:02 PM  Result Value Ref Range   Sodium 160 (H) 135 - 145 mmol/L   Potassium 3.8 3.5 - 5.1 mmol/L   Chloride 129 (H) 98 - 111 mmol/L   CO2 23 22 - 32 mmol/L   Glucose, Bld 98 70 - 99 mg/dL    Comment: Glucose reference range applies only to samples taken after fasting for at least 8  hours.   BUN 36 (H) 8 - 23 mg/dL   Creatinine, Ser 6.19 (H) 0.44 - 1.00 mg/dL   Calcium 8.5 (L) 8.9 - 10.3 mg/dL   GFR calc non Af Amer 44 (L) >60 mL/min   GFR calc Af Amer 51 (L) >60 mL/min   Anion gap 8 5 - 15    Comment: Performed at [facility], [address], [place], [place] [address]  CBC with Differential/Platelet     Status: Abnormal   Collection Time: 12/01/19   3:35 AM  Result Value Ref Range   WBC 7.8 4.0 - 10.5 K/uL   RBC 3.32 (L) 3.87 - 5.11 MIL/uL   Hemoglobin 11.0 (L) 12.0 - 15.0 g/dL   HCT 82.8 (L) 36 - 46 %   MCV 106.3 (H) 80.0 - 100.0 fL   MCH 33.1 26.0 - 34.0 pg   MCHC 31.2 30.0 - 36.0 g/dL   RDW 67.5 (H) 19.8 - 24.2 %   Platelets 83 (L) 150 - 400 K/uL    Comment: CONSISTENT WITH PREVIOUS RESULT Immature Platelet Fraction may be clinically indicated, consider ordering this additional test [id number] REPEATED TO VERIFY    nRBC 0.0 0.0 - 0.2 %   Neutrophils Relative % 67 %   Neutro Abs 5.3 1.7 - 7.7 K/uL   Lymphocytes Relative 19 %   Lymphs Abs 1.5 0.7 - 4.0 K/uL   Monocytes Relative 10 %   Monocytes Absolute 0.8 0 - 1 K/uL   Eosinophils Relative 3 %   Eosinophils Absolute 0.2 0 - 0 K/uL   Basophils Relative 1 %   Basophils Absolute 0.1 0 - 0 K/uL   Immature Granulocytes 0 %   Abs Immature Granulocytes 0.02 0.00 - 0.07 K/uL    Comment: Performed at [facility], [address] [place], [place] [address]  Magnesium     Status: None   Collection Time: 12/01/19  3:35 AM  Result Value Ref Range   Magnesium 2.4 1.7 - 2.4 mg/dL    Comment: Performed at [facility], [address] [place], [place] [address]  Basic metabolic panel     Status: Abnormal   Collection Time: 12/01/19  3:35 AM  Result Value Ref Range   Sodium 160 (H) 135 - 145 mmol/L   Potassium 4.0 3.5 - 5.1 mmol/L   Chloride 129 (H) 98 - 111 mmol/L   CO2 22 22 - 32 mmol/L   Glucose, Bld 107 (H) 70 - 99 mg/dL    Comment: Glucose reference range applies only to samples taken after fasting for at least 8 hours.   BUN 35 (H) 8 - 23 mg/dL   Creatinine, Ser 5.10 (H) 0.44 - 1.00 mg/dL   Calcium 8.5 (L) 8.9 - 10.3 mg/dL   GFR calc non Af Amer 43 (L) >60 mL/min   GFR calc Af Amer 50 (L) >60 mL/min   Anion gap 9 5 - 15    Comment: Performed at [facility], [address], [place], [place] [address]  Basic  metabolic panel     Status: Abnormal   Collection Time: 12/01/19 10:16 AM  Result Value Ref Range   Sodium 159 (H) 135 - 145 mmol/L   Potassium 3.6 3.5 - 5.1 mmol/L   Chloride 128 (H) 98 - 111 mmol/L   CO2 24 22 - 32 mmol/L   Glucose, Bld 140 (H) 70 - 99 mg/dL    Comment: Glucose reference range applies  only to samples taken after fasting for at least 8 hours.   BUN 34 (H) 8 - 23 mg/dL   Creatinine, Ser 1.13 (H) 0.44 - 1.00 mg/dL   Calcium 8.7 (L) 8.9 - 10.3 mg/dL   GFR calc non Af Amer 43 (L) >60 mL/min   GFR calc Af Amer 50 (L) >60 mL/min   Anion gap 7 5 - 15    Comment: Performed at [facility], [place] [address] [place], [place] [address]     Recent Results (from the past 240 hour(s))  SARS Coronavirus 2 by RT PCR (hospital order, performed in [facility] hospital lab) Nasopharyngeal Nasopharyngeal Swab     Status: None   Collection Time: 11/28/19  6:30 PM   Specimen: Nasopharyngeal Swab  Result Value Ref Range Status   SARS Coronavirus 2 NEGATIVE NEGATIVE Final    Comment: (NOTE) SARS-CoV-2 target nucleic acids are NOT DETECTED.  The SARS-CoV-2 RNA is generally detectable in upper and lower respiratory specimens during the acute phase of infection. The lowest concentration of SARS-CoV-2 viral copies this assay can detect is 250 copies / mL. A negative result does not preclude SARS-CoV-2 infection and should not be used as the sole basis for treatment or other patient management decisions.  A negative result may occur with improper specimen collection / handling, submission of specimen other than nasopharyngeal swab, presence of viral mutation(s) within the areas targeted by this assay, and inadequate number of viral copies (<250 copies / mL). A negative result must be combined with clinical observations, patient history, and epidemiological information.  Fact Sheet for Patients:   StrictlyIdeas.no  Fact Sheet for Healthcare  Providers: BankingDealers.co.za  This test is not yet approved or  cleared by the [place] FDA and has been authorized for detection and/or diagnosis of SARS-CoV-2 by FDA under an Emergency Use Authorization (EUA).  This EUA will remain in effect (meaning this test can be used) for the duration of the COVID-19 declaration under Section 564(b)(1) of the Act, 21 U.S.C. section 360bbb-3(b)(1), unless the authorization is terminated or revoked sooner.  Performed at [facility], [place] [address], [place], [place] [address]      Radiology Studies: DG Chest Port 1 View  Result Date: 11/30/2019 CLINICAL DATA:  Increased confusion this morning, history pneumonia, Alzheimer's, sarcoidosis, cirrhosis, CHF, hypertension EXAM: PORTABLE CHEST 1 VIEW COMPARISON:  Portable exam 0836 hours compared to 11/28/2019 FINDINGS: Enlargement of cardiac silhouette with pulmonary vascular congestion. Atherosclerotic calcification aorta. In increased RIGHT pleural effusion and basilar atelectasis. Minimal effusion and atelectasis at LEFT base. No pneumothorax. Bones demineralized with mild degenerative disc disease changes thoracic spine. IMPRESSION: Bibasilar pleural effusions and atelectasis LEFT greater than RIGHT, slightly increased on LEFT since prior study. Enlargement of cardiac silhouette with pulmonary vascular congestion. Electronically Signed   By: Lavonia Dana M.D.   On: 11/30/2019 09:15   DG Chest [facility] 1 View  Final Result    DG Chest Port 1 View  Final Result       Scheduled Meds: . aspirin  81 mg Oral Daily  . atorvastatin  40 mg Oral Daily  . heparin  5,000 Units Subcutaneous Q8H  . levETIRAcetam  750 mg Oral BID  . metoprolol tartrate  12.5 mg Oral Daily  . multivitamin  15 mL Oral Daily  . sodium chloride flush  3 mL Intravenous Q12H   PRN Meds: acetaminophen **OR** acetaminophen, albuterol, ondansetron **OR** ondansetron (ZOFRAN) IV, Resource  ThickenUp Clear Continuous  Infusions: . dextrose 50 mL/hr at 12/01/19 1317  . dextrose 5% lactated ringers 50 mL/hr at 12/01/19 0600      LOS: 3 days  Time spent: Greater than 50% of the 35 minute visit was spent in counseling/coordination of care for the patient as laid out in the A&P.   Dwyane Dee, MD [facility] 12/01/2019, 2:35 PM   Contact via secure chat.  To contact the attending provider between 7A-7P or the covering provider during after hours 7P-7A, please log into the web site [email or web address] and access using universal [place] password for that web site. If you do not have the password, please call the hospital operator.

## 2019-12-01 NOTE — Care Management Important Message (Signed)
Important Message  Patient Details IM Letter given to Ezekiel Ina RN Case Managger to present to the Patient Name: Kelly Cross MRN: [id number] Date of Birth: 1927-05-02   Medicare Important Message Given:  Yes     Caren Macadam 12/01/2019, 1:04 PM

## 2019-12-01 NOTE — Progress Notes (Signed)
Daily Progress Note   Patient Name: Kelly Cross       Date: 12/01/2019 DOB: 12/14/1926  Age: 86 y.o. MRN#: [id number] Attending Physician: Lewie Chamber, MD Primary Care Physician: Lorenda Ishihara, MD Admit Date: 11/28/2019  Reason for Consultation/Follow-up: Establishing goals of care  Subjective:  awake alert, sitting up in chair, is able to feed herself applesauce, able to swallow much better today, no coughing on eating today. Bedside RN in the room, discussed with her, patient not able to void, currently being in/out cathed, ?may need foley, patient is not able to ambulate at baseline. Still non verbal, which might be her baseline. Is able to wave, use hand gestures appropriately, tracks me in the room, nods appropriately, no family at bedside, has a legal guardian.   Length of Stay: 3  Current Medications: Scheduled Meds:  . aspirin  81 mg Oral Daily  . atorvastatin  40 mg Oral Daily  . heparin  5,000 Units Subcutaneous Q8H  . levETIRAcetam  750 mg Oral BID  . metoprolol tartrate  12.5 mg Oral Daily  . multivitamin  15 mL Oral Daily  . sodium chloride flush  3 mL Intravenous Q12H    Continuous Infusions: . dextrose 50 mL/hr at 12/01/19 1317  . dextrose 5% lactated ringers 50 mL/hr at 12/01/19 0600    PRN Meds: acetaminophen **OR** acetaminophen, albuterol, ondansetron **OR** ondansetron (ZOFRAN) IV, Resource ThickenUp Clear  Physical Exam         Awake alert No distress Regular work of breathing S1 S2 Abdomen not distended No edema Some contractures  Has dementia  Vital Signs: BP 134/87 (BP Location: Left Arm)   Pulse 81   Temp 98.7 F (37.1 C)   Resp 19   Ht 5' 2.99" (1.6 m)   Wt 57.2 kg   SpO2 91%   BMI 22.34 kg/m  SpO2: SpO2: 91 % O2  Device: O2 Device: Nasal Cannula O2 Flow Rate: O2 Flow Rate (L/min): 2 L/min  Intake/output summary:   Intake/Output Summary (Last 24 hours) at 12/01/2019 1416 Last data filed at 12/01/2019 1000 Gross per 24 hour  Intake 1106.1 ml  Output 900 ml  Net 206.1 ml   LBM: Last BM Date: 12/01/19 (ostomy) Baseline Weight: Weight: 57.2 kg Most recent weight: Weight: 57.2 kg  Palliative Assessment/Data:    Flowsheet Rows     Most Recent Value  Intake Tab  Referral Department Hospitalist  Unit at Time of Referral ER  Date Notified 11/29/19  Palliative Care Type New Palliative care  Reason for referral Clarify Goals of Care  Date of Admission 11/28/19  Date first seen by Palliative Care 11/30/19  # of days Palliative referral response time 1 Day(s)  # of days IP prior to Palliative referral 1  Clinical Assessment  Psychosocial & Spiritual Assessment  Palliative Care Outcomes      Patient Active Problem List   Diagnosis Date Noted  . AKI (acute kidney injury) (HCC) 11/28/2019  . Chronic systolic CHF (congestive heart failure) (HCC) 11/28/2019  . Hypernatremia 11/28/2019  . Dehydration 11/28/2019  . Anemia 11/28/2019  . Elevated troponin 11/28/2019  . Other cirrhosis of liver (HCC) 11/28/2019  . Pleural effusion 11/28/2019  . Elevated liver enzymes 11/28/2019  . Elevated lactic acid level 11/28/2019  . Seizure disorder (HCC) 11/28/2019  . Rectal prolapse 04/30/2018  . Rectal mucosa prolapse 11/10/2017  . Chronic pain of right knee 05/15/2016  . Dementia of the Alzheimer's type without behavioral disturbance (HCC) 10/11/2013  . Gait instability 10/11/2013  . Memory loss 09/06/2012  . Benign paroxysmal positional vertigo 09/06/2012  . High cholesterol   . Anxiety     Palliative Care Assessment & Plan   Patient Profile:    Assessment:  86 yo lady who is from SNF, has dementia, HTN, pulmonary sarcoidosis, restless leg syndrome, history of recent hospitalizations  for seizures and parotid gland lesion, has a legal guardian.   Patient admitted to hospital medicine service this time at [facility] with lethargy, electrolyte abnormalities such as high Na and Cl levels. Also with AKI and with EF known to be 35%, concern for systolic heart failure, pleural effusions and high index of suspicion for aspiration.   A palliative consult has been requested for goals of care discussions.    Recommendations/Plan: Continue current mode of care.  Monitor PO and OOB, may need foley for comfort Recommend palliative services follow her at her long term care facility which is [facility].     Code Status:    Code Status Orders  (From admission, onward)         Start     Ordered   11/29/19 0106  Do not attempt resuscitation (DNR)  Continuous       Question Answer Comment  In the event of cardiac or respiratory ARREST Do not call a "code blue"   In the event of cardiac or respiratory ARREST Do not perform Intubation, CPR, defibrillation or ACLS   In the event of cardiac or respiratory ARREST Use medication by any route, position, wound care, and other measures to relive pain and suffering. May use oxygen, suction and manual treatment of airway obstruction as needed for comfort.      11/29/19 0105        Code Status History    Date Active Date Inactive Code Status Order ID Comments User Context   04/30/2018 1541 05/04/2018 1515 Full Code [id number]  Romie Levee, MD Inpatient   Advance Care Planning Activity    Advance Directive Documentation     Most Recent Value  Type of Advance Directive Healthcare Power of Attorney, Living will  Pre-existing out of facility DNR order (yellow form or pink MOST form) --  "MOST" Form in Place? --      Prognosis:  Unable  to determine  Discharge Planning: back to [facility] with palliative.   Care plan was discussed with  IDT  Thank you for allowing the Palliative Medicine Team to assist in the care  of this patient.   Time In: 11 Time Out: 11.25 Total Time 25 Prolonged Time Billed No.       Greater than 50%  of this time was spent counseling and coordinating care related to the above assessment and plan.  Rosalin Hawking, MD  Please contact Palliative Medicine Team phone at [phone] for questions and concerns.

## 2019-12-01 NOTE — TOC Progression Note (Signed)
Transition of Care [facility]) - Progression Note    Patient Details  Name: Kelly Cross MRN: [id number] Date of Birth: [birth year]  Transition of Care [facility]) CM/SW Contact  Geni Bers, RN Phone Number: 12/01/2019, 10:41 AM  Clinical Narrative:    Pt is from [facility] and plan to return when stable.    Expected Discharge Plan: Skilled Nursing Facility Barriers to Discharge: No Barriers Identified  Expected Discharge Plan and Services Expected Discharge Plan: Skilled Nursing Facility       Living arrangements for the past 2 months: Skilled Nursing Facility                                       Social Determinants of Health (SDOH) Interventions    Readmission Risk Interventions No flowsheet data found.

## 2019-12-02 LAB — BASIC METABOLIC PANEL
Anion gap: 6 (ref 5–15)
Anion gap: 9 (ref 5–15)
Anion gap: 6 (ref 5–15)
Anion gap: 8 (ref 5–15)
Anion gap: 5 (ref 5–15)
BUN: 29 mg/dL — ABNORMAL HIGH (ref 8–23)
BUN: 28 mg/dL — ABNORMAL HIGH (ref 8–23)
BUN: 29 mg/dL — ABNORMAL HIGH (ref 8–23)
BUN: 27 mg/dL — ABNORMAL HIGH (ref 8–23)
BUN: 25 mg/dL — ABNORMAL HIGH (ref 8–23)
CO2: 23 mmol/L (ref 22–32)
CO2: 24 mmol/L (ref 22–32)
CO2: 23 mmol/L (ref 22–32)
CO2: 22 mmol/L (ref 22–32)
CO2: 24 mmol/L (ref 22–32)
Calcium: 8.5 mg/dL — ABNORMAL LOW (ref 8.9–10.3)
Calcium: 8.3 mg/dL — ABNORMAL LOW (ref 8.9–10.3)
Calcium: 8.2 mg/dL — ABNORMAL LOW (ref 8.9–10.3)
Calcium: 8.3 mg/dL — ABNORMAL LOW (ref 8.9–10.3)
Calcium: 8.3 mg/dL — ABNORMAL LOW (ref 8.9–10.3)
Chloride: 129 mmol/L — ABNORMAL HIGH (ref 98–111)
Chloride: 126 mmol/L — ABNORMAL HIGH (ref 98–111)
Chloride: 127 mmol/L — ABNORMAL HIGH (ref 98–111)
Chloride: 125 mmol/L — ABNORMAL HIGH (ref 98–111)
Chloride: 124 mmol/L — ABNORMAL HIGH (ref 98–111)
Creatinine, Ser: 1.01 mg/dL — ABNORMAL HIGH (ref 0.44–1.00)
Creatinine, Ser: 1.01 mg/dL — ABNORMAL HIGH (ref 0.44–1.00)
Creatinine, Ser: 1.1 mg/dL — ABNORMAL HIGH (ref 0.44–1.00)
Creatinine, Ser: 1.14 mg/dL — ABNORMAL HIGH (ref 0.44–1.00)
Creatinine, Ser: 1.13 mg/dL — ABNORMAL HIGH (ref 0.44–1.00)
GFR calc Af Amer: 57 mL/min — ABNORMAL LOW (ref >60)
GFR calc Af Amer: 57 mL/min — ABNORMAL LOW (ref >60)
GFR calc Af Amer: 51 mL/min — ABNORMAL LOW (ref >60)
GFR calc Af Amer: 49 mL/min — ABNORMAL LOW (ref >60)
GFR calc Af Amer: 50 mL/min — ABNORMAL LOW (ref >60)
GFR calc non Af Amer: 49 mL/min — ABNORMAL LOW (ref >60)
GFR calc non Af Amer: 49 mL/min — ABNORMAL LOW (ref >60)
GFR calc non Af Amer: 44 mL/min — ABNORMAL LOW (ref >60)
GFR calc non Af Amer: 42 mL/min — ABNORMAL LOW (ref >60)
GFR calc non Af Amer: 43 mL/min — ABNORMAL LOW (ref >60)
Glucose, Bld: 130 mg/dL — ABNORMAL HIGH (ref 70–99)
Glucose, Bld: 125 mg/dL — ABNORMAL HIGH (ref 70–99)
Glucose, Bld: 128 mg/dL — ABNORMAL HIGH (ref 70–99)
Glucose, Bld: 154 mg/dL — ABNORMAL HIGH (ref 70–99)
Glucose, Bld: 143 mg/dL — ABNORMAL HIGH (ref 70–99)
Potassium: 3.3 mmol/L — ABNORMAL LOW (ref 3.5–5.1)
Potassium: 3.3 mmol/L — ABNORMAL LOW (ref 3.5–5.1)
Potassium: 3.2 mmol/L — ABNORMAL LOW (ref 3.5–5.1)
Potassium: 3.7 mmol/L (ref 3.5–5.1)
Potassium: 3.6 mmol/L (ref 3.5–5.1)
Sodium: 158 mmol/L — ABNORMAL HIGH (ref 135–145)
Sodium: 159 mmol/L — ABNORMAL HIGH (ref 135–145)
Sodium: 156 mmol/L — ABNORMAL HIGH (ref 135–145)
Sodium: 155 mmol/L — ABNORMAL HIGH (ref 135–145)
Sodium: 153 mmol/L — ABNORMAL HIGH (ref 135–145)

## 2019-12-02 LAB — CBC WITH DIFFERENTIAL/PLATELET
Abs Immature Granulocytes: 0.02 10*3/uL (ref 0.00–0.07)
Basophils Absolute: 0.1 10*3/uL (ref 0.0–0.1)
Basophils Relative: 1 %
Eosinophils Absolute: 0.3 10*3/uL (ref 0.0–0.5)
Eosinophils Relative: 3 %
HCT: 36.4 % (ref 36.0–46.0)
Hemoglobin: 11.2 g/dL — ABNORMAL LOW (ref 12.0–15.0)
Immature Granulocytes: 0 %
Lymphocytes Relative: 21 %
Lymphs Abs: 1.8 10*3/uL (ref 0.7–4.0)
MCH: 33 pg (ref 26.0–34.0)
MCHC: 30.8 g/dL (ref 30.0–36.0)
MCV: 107.4 fL — ABNORMAL HIGH (ref 80.0–100.0)
Monocytes Absolute: 0.8 10*3/uL (ref 0.1–1.0)
Monocytes Relative: 10 %
Neutro Abs: 5.6 10*3/uL (ref 1.7–7.7)
Neutrophils Relative %: 65 %
Platelets: 91 10*3/uL — ABNORMAL LOW (ref 150–400)
RBC: 3.39 MIL/uL — ABNORMAL LOW (ref 3.87–5.11)
RDW: 16.6 % — ABNORMAL HIGH (ref 11.5–15.5)
WBC: 8.6 10*3/uL (ref 4.0–10.5)
nRBC: 0 % (ref 0.0–0.2)

## 2019-12-02 LAB — MAGNESIUM: Magnesium: 2.2 mg/dL (ref 1.7–2.4)

## 2019-12-02 MED ORDER — POTASSIUM CHLORIDE 10 MEQ/100ML IV SOLN
10.0000 meq | INTRAVENOUS | Status: AC
  Administered 2019-12-02 (×4): 10 meq via INTRAVENOUS
  Filled 2019-12-02 (×4): qty 100

## 2019-12-02 NOTE — Progress Notes (Signed)
Physical Therapy Treatment Patient Details Name: Kelly Cross MRN: [id number] DOB: 05/18/1927 Today's Date: 12/02/2019    History of Present Illness 86 y.o. female with medical history significant of dementia, hypertension, HLD, sarcoidosis with no pulmonary involvement, recent seizure, osteoporosis, stroke, cirrhosis and admitted for dehydration hypernatremia and AKI with concomitant bilateral pleural effusions    PT Comments    Pt cooperative with PT however requiring incr assist overall compared to last session. Continue to recommend SNF/return to [facility]. May benefit from PT at SNF level depending on pt baseline functional status. Will continue PT in acute setting  Follow Up Recommendations  Supervision/Assistance - 24 hour;SNF     Equipment Recommendations  None recommended by PT    Recommendations for Other Services       Precautions / Restrictions Precautions Precautions: Fall Precaution Comments: colostomy, aspiration    Mobility  Bed Mobility Overal bed mobility: Needs Assistance Bed Mobility: Rolling Rolling: Max assist   Supine to sit: Mod assist;Max assist Sit to supine: Mod assist   General bed mobility comments: Mod assist to transfer to side of bed - needing assistance for trunk negotiation.  Transfers Overall transfer level: Needs assistance     Sit to Stand: Max assist         General transfer comment: partial sit to stand, pt unable to come to full stand, bed not elevated further d/t  decr safety with assist of 1  Ambulation/Gait                 Stairs             Wheelchair Mobility    Modified Rankin (Stroke Patients Only)       Balance     Sitting balance-Leahy Scale: Fair Sitting balance - Comments: pt uses bil UE support to self assist   Standing balance support: During functional activity Standing balance-Leahy Scale: Zero                              Cognition Arousal/Alertness:  Awake/alert Behavior During Therapy: Flat affect Overall Cognitive Status: History of cognitive impairments - at baseline                                 General Comments: per chart, little verbalizations at baseline. pt nods to some yes/no questions      Exercises Other Exercises Other Exercises: heel slides bil x5, AAROM d/t cognition and for incr ROM    General Comments        Pertinent Vitals/Pain Pain Assessment: Faces Faces Pain Scale: No hurt Pain Intervention(s): Monitored during session;Repositioned    Home Living                      Prior Function            PT Goals (current goals can now be found in the care plan section) Acute Rehab PT Goals Patient Stated Goal: did not state PT Goal Formulation: Patient unable to participate in goal setting Time For Goal Achievement: 12/13/19 Potential to Achieve Goals: Fair Progress towards PT goals: Progressing toward goals    Frequency    Min 2X/week      PT Plan Current plan remains appropriate    Co-evaluation              AM-PAC PT "6 Clicks" Mobility  Outcome Measure  Help needed turning from your back to your side while in a flat bed without using bedrails?: A Lot Help needed moving from lying on your back to sitting on the side of a flat bed without using bedrails?: A Lot Help needed moving to and from a bed to a chair (including a wheelchair)?: Total Help needed standing up from a chair using your arms (e.g., wheelchair or bedside chair)?: Total Help needed to walk in hospital room?: Total Help needed climbing 3-5 steps with a railing? : Total 6 Click Score: 8    End of Session   Activity Tolerance: Patient tolerated treatment well Patient left: in bed;with call bell/phone within reach;with bed alarm set   PT Visit Diagnosis: Other abnormalities of gait and mobility (R26.89);Muscle weakness (generalized) (M62.81)     Time: 5462-7035 PT Time Calculation (min)  (ACUTE ONLY): 17 min  Charges:  $Therapeutic Activity: 8-22 mins                     Delice Bison, PT  Acute Rehab Dept ([facility]) [phone] Pager [phone]  12/02/2019    [facility] 12/02/2019, 3:09 PM

## 2019-12-02 NOTE — Progress Notes (Signed)
PROGRESS NOTE    Kelly Cross   QPR:[id number]  DOB: April 17, 1927  DOA: 11/28/2019     4  PCP: Leeroy Cha, MD  CC: lethargy, hypernatremia  Hospital Course: Kelly Cross is a 75 AA female with PMH dementia, hypertension, hyperlipidemia, sarcoidosis with no pulmonary involvement, seizure disorder, osteoporosis, CVA, Rectal prolapse status post rectopexy + colostomy creation 2019 who presented with hypernatremia, sodium 158.  She had also been having reported decreased oral intake.  Her baseline is that she is rarely verbal and has significant dementia per nursing staff at [facility].  The patient was more lethargic than usual and was referred to the ER for further evaluation and treatment.  Other history includes: In May patient was admitted to [facility] with seizures felt to be secondary to hypoglycemia as well as prior CVAs in the Alzheimer's induced epilepsy she undergone further evaluation including CT MRI and EEG and was discharged back to facility on Keppra 750 twice daily and her donepezil was discontinued Her hypoglycemia at that time felt to be secondary to accidental overdose with glipizide as her hypoglycemic panel was positive for glipizide She was also noted to have elevated LFTs in June 2021. AST was 121 ALT 193 CT scan of the liver done at that time showed possibility of early cirrhosis With diffuse fatty infiltration of the liver and gallstones early cirrhosis cirrhosis was confirmed with ultrasound of the liver as well.  She was admitted for treatment of her hypernatremia and placed on D5 LR infusion.  Hypernatremia was refractory to D5 LR infusion, therefore D10 was also added on and sodium levels responded.  Her appetite and meal intake remained low/poor throughout hospitalization.   Interval History:  No events overnight. Remains awake and more alert today. Fed herself some but not much. Na has not responded much to D5LR and has been further escalated to  D10 in addition.   Old records reviewed in assessment of this patient  ROS: Review of systems not obtained due to patient factors.  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  This seems to be her current state as well -will continue monitoring mentation while correcting hypernatremia, but if electrolyte abnormalities correct with no significant change in mentation, this is likely her baseline  AKI (acute kidney injury) ([place]) -Continue fluids and trend BMP - renal function responding to IVF; continue   Chronic systolic CHF (congestive heart failure) (HCC) -EF 35 to 40% on April 2021 echo -Monitor for any signs of volume overload while on fluids  Hypernatremia - refractory to D5LR; will add on D10 and monitor BMP q4h then de-escalate as able - mentation has improved some  -D10 along with D5 LR initiated on 12/02/2019.  Sodium is down trended to 155 after combination therapy -Continue both infusions for another 24 hours while trending BMP.  If improved tomorrow, will consider discharging  Seizure disorder ([place]) -Continue Keppra  Anemia - iron levels adequate; no further workup at this time   Antimicrobials: n/a  DVT prophylaxis: HSQ Code Status: DNR Family Communication: none Disposition Plan:  . Patient came from: SNF        . Barriers to d/c OR conditions which need to be met to effect a safe d/c: none currently . The current disposition plan is discharge to: SNF tomorrow or Sunday   Objective: Vitals:  12/01/19 2153 12/02/19 0547 12/02/19 0953 12/02/19 1237  BP: 133/83 132/82 126/80 118/79  Pulse: 73 76 72 70  Resp: (!) 24 (!) 21  (!) 23  Temp: 98.7 F (37.1 C) 97.7 F (36.5 C)  97.7 F (36.5 C)  TempSrc:  Oral  Oral  SpO2: 100% 100%  100%  Weight:      Height:         Intake/Output Summary (Last 24 hours) at 12/02/2019 1521 Last data filed at 12/02/2019 0900 Gross per 24 hour  Intake 1137.09 ml  Output --  Net 1137.09 ml   Filed Weights   11/28/19 2210  Weight: 57.2 kg    Examination: General appearance: Little more awake and alert, following some commands Head: Normocephalic, without obvious abnormality Eyes: Obvious protrusion of eyeballs almost exophthalmos Lungs: clear to auscultation bilaterally Heart: regular rate and rhythm and S1, S2 normal Abdomen: normal findings: bowel sounds normal Extremities: No edema Skin: mobility and turgor normal Neurologic: Moves all 4 extremities spontaneously.  More verbal today and following some commands  Consultants:   N/A  Procedures:   N/A  Data Reviewed: I have personally reviewed following labs and imaging studies Results for orders placed or performed during the hospital encounter of 11/28/19 (from the past 72 hour(s))  Comprehensive metabolic panel     Status: Abnormal   Collection Time: 11/29/19  5:10 PM  Result Value Ref Range   Sodium 160 (H) 135 - 145 mmol/L   Potassium 4.1 3.5 - 5.1 mmol/L   Chloride 129 (H) 98 - 111 mmol/L   CO2 24 22 - 32 mmol/L   Glucose, Bld 110 (H) 70 - 99 mg/dL    Comment: Glucose reference range applies only to samples taken after fasting for at least 8 hours.   BUN 51 (H) 8 - 23 mg/dL   Creatinine, Ser 9.20 (H) 0.44 - 1.00 mg/dL   Calcium 8.7 (L) 8.9 - 10.3 mg/dL   Total Protein 6.2 (L) 6.5 - 8.1 g/dL   Albumin 3.0 (L) 3.5 - 5.0 g/dL   AST 646 (H) 15 - 41 U/L   ALT 132 (H) 0 - 44 U/L   Alkaline Phosphatase 225 (H) 38 - 126 U/L   Total Bilirubin 0.9 0.3 - 1.2 mg/dL   GFR calc non Af Amer 29 (L) >60 mL/min   GFR calc Af Amer 34 (L) >60 mL/min   Anion gap 7 5 - 15    Comment: Performed at [facility], [address] [place], [place] [address]  CBC with Differential/Platelet     Status: Abnormal   Collection Time: 11/30/19  4:00  AM  Result Value Ref Range   WBC 8.9 4.0 - 10.5 K/uL   RBC 3.37 (L) 3.87 - 5.11 MIL/uL   Hemoglobin 11.2 (L) 12.0 - 15.0 g/dL   HCT 13.8 36 - 46 %   MCV 107.4 (H) 80.0 - 100.0 fL   MCH 33.2 26.0 - 34.0 pg   MCHC 30.9 30.0 - 36.0 g/dL   RDW 40.2 (H) 01.1 - 46.6 %   Platelets 96 (L) 150 - 400 K/uL    Comment: CONSISTENT WITH PREVIOUS RESULT Immature Platelet Fraction may be clinically indicated, consider ordering this additional test [id number] REPEATED TO VERIFY    nRBC 0.0 0.0 - 0.2 %   Neutrophils Relative % 72 %   Neutro Abs 6.3 1.7 - 7.7 K/uL   Lymphocytes Relative 18 %   Lymphs Abs 1.6 0.7 - 4.0  K/uL   Monocytes Relative 9 %   Monocytes Absolute 0.8 0 - 1 K/uL   Eosinophils Relative 1 %   Eosinophils Absolute 0.1 0 - 0 K/uL   Basophils Relative 0 %   Basophils Absolute 0.0 0 - 0 K/uL   Immature Granulocytes 0 %   Abs Immature Granulocytes 0.04 0.00 - 0.07 K/uL    Comment: Performed at [facility], [place] [address], [place], [place] [address]  Basic metabolic panel     Status: Abnormal   Collection Time: 11/30/19 10:51 AM  Result Value Ref Range   Sodium 159 (H) 135 - 145 mmol/L   Potassium 3.7 3.5 - 5.1 mmol/L   Chloride 128 (H) 98 - 111 mmol/L   CO2 23 22 - 32 mmol/L   Glucose, Bld 124 (H) 70 - 99 mg/dL    Comment: Glucose reference range applies only to samples taken after fasting for at least 8 hours.   BUN 40 (H) 8 - 23 mg/dL   Creatinine, Ser 1.26 (H) 0.44 - 1.00 mg/dL   Calcium 8.6 (L) 8.9 - 10.3 mg/dL   GFR calc non Af Amer 37 (L) >60 mL/min   GFR calc Af Amer 43 (L) >60 mL/min   Anion gap 8 5 - 15    Comment: Performed at [facility], [place] [address], [place], [place] [address]  CBC with Differential/Platelet     Status: Abnormal   Collection Time: 11/30/19 10:51 AM  Result Value Ref Range   WBC 8.7 4.0 - 10.5 K/uL   RBC 3.50 (L) 3.87 - 5.11 MIL/uL   Hemoglobin 11.8 (L) 12.0 - 15.0 g/dL   HCT 37.8 36 - 46 %   MCV  108.0 (H) 80.0 - 100.0 fL   MCH 33.7 26.0 - 34.0 pg   MCHC 31.2 30.0 - 36.0 g/dL   RDW 16.5 (H) 11.5 - 15.5 %   Platelets 94 (L) 150 - 400 K/uL    Comment: CONSISTENT WITH PREVIOUS RESULT Immature Platelet Fraction may be clinically indicated, consider ordering this additional test [id number]    nRBC 0.0 0.0 - 0.2 %   Neutrophils Relative % 68 %   Neutro Abs 5.9 1.7 - 7.7 K/uL   Lymphocytes Relative 21 %   Lymphs Abs 1.8 0.7 - 4.0 K/uL   Monocytes Relative 8 %   Monocytes Absolute 0.7 0 - 1 K/uL   Eosinophils Relative 2 %   Eosinophils Absolute 0.2 0 - 0 K/uL   Basophils Relative 1 %   Basophils Absolute 0.1 0 - 0 K/uL   Immature Granulocytes 0 %   Abs Immature Granulocytes 0.03 0.00 - 0.07 K/uL    Comment: Performed at [facility], [place] [address], [place], [place] [address]  Basic metabolic panel     Status: Abnormal   Collection Time: 11/30/19  3:30 PM  Result Value Ref Range   Sodium 160 (H) 135 - 145 mmol/L   Potassium 3.5 3.5 - 5.1 mmol/L   Chloride 128 (H) 98 - 111 mmol/L   CO2 22 22 - 32 mmol/L   Glucose, Bld 148 (H) 70 - 99 mg/dL    Comment: Glucose reference range applies only to samples taken after fasting for at least 8 hours.   BUN 38 (H) 8 - 23 mg/dL   Creatinine, Ser 1.14 (H) 0.44 - 1.00 mg/dL   Calcium 8.6 (L) 8.9 - 10.3 mg/dL   GFR calc non Af Amer 42 (L) >  60 mL/min   GFR calc Af Amer 49 (L) >60 mL/min   Anion gap 10 5 - 15    Comment: Performed at [facility], [address], [place], [place] [address]  Basic metabolic panel     Status: Abnormal   Collection Time: 11/30/19 10:02 PM  Result Value Ref Range   Sodium 160 (H) 135 - 145 mmol/L   Potassium 3.8 3.5 - 5.1 mmol/L   Chloride 129 (H) 98 - 111 mmol/L   CO2 23 22 - 32 mmol/L   Glucose, Bld 98 70 - 99 mg/dL    Comment: Glucose reference range applies only to samples taken after fasting for at least 8 hours.   BUN 36 (H) 8 - 23 mg/dL   Creatinine, Ser 6.94 (H)  0.44 - 1.00 mg/dL   Calcium 8.5 (L) 8.9 - 10.3 mg/dL   GFR calc non Af Amer 44 (L) >60 mL/min   GFR calc Af Amer 51 (L) >60 mL/min   Anion gap 8 5 - 15    Comment: Performed at [facility] [place], [address], [place], [place] [address]  CBC with Differential/Platelet     Status: Abnormal   Collection Time: 12/01/19  3:35 AM  Result Value Ref Range   WBC 7.8 4.0 - 10.5 K/uL   RBC 3.32 (L) 3.87 - 5.11 MIL/uL   Hemoglobin 11.0 (L) 12.0 - 15.0 g/dL   HCT 70.3 (L) 36 - 46 %   MCV 106.3 (H) 80.0 - 100.0 fL   MCH 33.1 26.0 - 34.0 pg   MCHC 31.2 30.0 - 36.0 g/dL   RDW 50.0 (H) 93.8 - 18.2 %   Platelets 83 (L) 150 - 400 K/uL    Comment: CONSISTENT WITH PREVIOUS RESULT Immature Platelet Fraction may be clinically indicated, consider ordering this additional test [id number] REPEATED TO VERIFY    nRBC 0.0 0.0 - 0.2 %   Neutrophils Relative % 67 %   Neutro Abs 5.3 1.7 - 7.7 K/uL   Lymphocytes Relative 19 %   Lymphs Abs 1.5 0.7 - 4.0 K/uL   Monocytes Relative 10 %   Monocytes Absolute 0.8 0 - 1 K/uL   Eosinophils Relative 3 %   Eosinophils Absolute 0.2 0 - 0 K/uL   Basophils Relative 1 %   Basophils Absolute 0.1 0 - 0 K/uL   Immature Granulocytes 0 %   Abs Immature Granulocytes 0.02 0.00 - 0.07 K/uL    Comment: Performed at [facility], [address], [place], [place] [address]  Magnesium     Status: None   Collection Time: 12/01/19  3:35 AM  Result Value Ref Range   Magnesium 2.4 1.7 - 2.4 mg/dL    Comment: Performed at [facility], [address], [place], [place] [address]  Basic metabolic panel     Status: Abnormal   Collection Time: 12/01/19  3:35 AM  Result Value Ref Range   Sodium 160 (H) 135 - 145 mmol/L   Potassium 4.0 3.5 - 5.1 mmol/L   Chloride 129 (H) 98 - 111 mmol/L   CO2 22 22 - 32 mmol/L   Glucose, Bld 107 (H) 70 - 99 mg/dL    Comment: Glucose reference range applies only to samples taken after fasting for at  least 8 hours.   BUN 35 (H) 8 - 23 mg/dL   Creatinine, Ser 1.02 (H) 0.44 - 1.00 mg/dL   Calcium 8.5 (L) 8.9 - 10.3 mg/dL  GFR calc non Af Amer 43 (L) >60 mL/min   GFR calc Af Amer 50 (L) >60 mL/min   Anion gap 9 5 - 15    Comment: Performed at [facility], [place] [address], [place], [place] [address]  Basic metabolic panel     Status: Abnormal   Collection Time: 12/01/19 10:16 AM  Result Value Ref Range   Sodium 159 (H) 135 - 145 mmol/L   Potassium 3.6 3.5 - 5.1 mmol/L   Chloride 128 (H) 98 - 111 mmol/L   CO2 24 22 - 32 mmol/L   Glucose, Bld 140 (H) 70 - 99 mg/dL    Comment: Glucose reference range applies only to samples taken after fasting for at least 8 hours.   BUN 34 (H) 8 - 23 mg/dL   Creatinine, Ser 1.13 (H) 0.44 - 1.00 mg/dL   Calcium 8.7 (L) 8.9 - 10.3 mg/dL   GFR calc non Af Amer 43 (L) >60 mL/min   GFR calc Af Amer 50 (L) >60 mL/min   Anion gap 7 5 - 15    Comment: Performed at [facility], [place] [address] [place], [place] [address]  Basic metabolic panel     Status: Abnormal   Collection Time: 12/01/19  2:00 PM  Result Value Ref Range   Sodium 159 (H) 135 - 145 mmol/L   Potassium 3.4 (L) 3.5 - 5.1 mmol/L   Chloride 130 (H) 98 - 111 mmol/L   CO2 25 22 - 32 mmol/L   Glucose, Bld 121 (H) 70 - 99 mg/dL    Comment: Glucose reference range applies only to samples taken after fasting for at least 8 hours.   BUN 34 (H) 8 - 23 mg/dL   Creatinine, Ser 1.11 (H) 0.44 - 1.00 mg/dL   Calcium 8.7 (L) 8.9 - 10.3 mg/dL   GFR calc non Af Amer 44 (L) >60 mL/min   GFR calc Af Amer 51 (L) >60 mL/min   Anion gap 4 (L) 5 - 15    Comment: Performed at [facility], [place] [address], [place], [place] [address]  Basic metabolic panel     Status: Abnormal   Collection Time: 12/01/19  5:48 PM  Result Value Ref Range   Sodium 159 (H) 135 - 145 mmol/L   Potassium 3.2 (L) 3.5 - 5.1 mmol/L   Chloride 128 (H) 98 - 111 mmol/L   CO2 25 22  - 32 mmol/L   Glucose, Bld 121 (H) 70 - 99 mg/dL    Comment: Glucose reference range applies only to samples taken after fasting for at least 8 hours.   BUN 33 (H) 8 - 23 mg/dL   Creatinine, Ser 1.11 (H) 0.44 - 1.00 mg/dL   Calcium 8.4 (L) 8.9 - 10.3 mg/dL   GFR calc non Af Amer 44 (L) >60 mL/min   GFR calc Af Amer 51 (L) >60 mL/min   Anion gap 6 5 - 15    Comment: Performed at [facility], [place] [address], [place], [place] [address]  Basic metabolic panel     Status: Abnormal   Collection Time: 12/01/19  9:47 PM  Result Value Ref Range   Sodium 157 (H) 135 - 145 mmol/L   Potassium 3.6 3.5 - 5.1 mmol/L   Chloride 124 (H) 98 - 111 mmol/L   CO2 24 22 - 32 mmol/L   Glucose, Bld 149 (H) 70 - 99 mg/dL    Comment: Glucose reference  range applies only to samples taken after fasting for at least 8 hours.   BUN 30 (H) 8 - 23 mg/dL   Creatinine, Ser 1.08 (H) 0.44 - 1.00 mg/dL   Calcium 8.5 (L) 8.9 - 10.3 mg/dL   GFR calc non Af Amer 45 (L) >60 mL/min   GFR calc Af Amer 52 (L) >60 mL/min   Anion gap 9 5 - 15    Comment: Performed at [facility] [place] [address] [place], [place] [address]  Basic metabolic panel     Status: Abnormal   Collection Time: 12/02/19  2:14 AM  Result Value Ref Range   Sodium 158 (H) 135 - 145 mmol/L   Potassium 3.3 (L) 3.5 - 5.1 mmol/L   Chloride 129 (H) 98 - 111 mmol/L   CO2 23 22 - 32 mmol/L   Glucose, Bld 130 (H) 70 - 99 mg/dL    Comment: Glucose reference range applies only to samples taken after fasting for at least 8 hours.   BUN 29 (H) 8 - 23 mg/dL   Creatinine, Ser 1.01 (H) 0.44 - 1.00 mg/dL   Calcium 8.5 (L) 8.9 - 10.3 mg/dL   GFR calc non Af Amer 49 (L) >60 mL/min   GFR calc Af Amer 57 (L) >60 mL/min   Anion gap 6 5 - 15    Comment: Performed at [facility], [place] [address], [place], [place] [address]  CBC with Differential/Platelet     Status: Abnormal   Collection Time: 12/02/19  2:14 AM   Result Value Ref Range   WBC 8.6 4.0 - 10.5 K/uL   RBC 3.39 (L) 3.87 - 5.11 MIL/uL   Hemoglobin 11.2 (L) 12.0 - 15.0 g/dL   HCT 36.4 36 - 46 %   MCV 107.4 (H) 80.0 - 100.0 fL   MCH 33.0 26.0 - 34.0 pg   MCHC 30.8 30.0 - 36.0 g/dL   RDW 16.6 (H) 11.5 - 15.5 %   Platelets 91 (L) 150 - 400 K/uL    Comment: SPECIMEN CHECKED FOR CLOTS CONSISTENT WITH PREVIOUS RESULT Immature Platelet Fraction may be clinically indicated, consider ordering this additional test [id number]    nRBC 0.0 0.0 - 0.2 %   Neutrophils Relative % 65 %   Neutro Abs 5.6 1.7 - 7.7 K/uL   Lymphocytes Relative 21 %   Lymphs Abs 1.8 0.7 - 4.0 K/uL   Monocytes Relative 10 %   Monocytes Absolute 0.8 0 - 1 K/uL   Eosinophils Relative 3 %   Eosinophils Absolute 0.3 0 - 0 K/uL   Basophils Relative 1 %   Basophils Absolute 0.1 0 - 0 K/uL   Immature Granulocytes 0 %   Abs Immature Granulocytes 0.02 0.00 - 0.07 K/uL    Comment: Performed at [facility], [place] [address] [place], [place] [address]  Magnesium     Status: None   Collection Time: 12/02/19  2:14 AM  Result Value Ref Range   Magnesium 2.2 1.7 - 2.4 mg/dL    Comment: Performed at [facility], [place] [address] [place], [place] [address]  Basic metabolic panel     Status: Abnormal   Collection Time: 12/02/19  5:42 AM  Result Value Ref Range   Sodium 159 (H) 135 - 145 mmol/L   Potassium 3.3 (L) 3.5 - 5.1 mmol/L   Chloride 126 (H) 98 - 111 mmol/L   CO2 24 22 - 32 mmol/L   Glucose, Bld  125 (H) 70 - 99 mg/dL    Comment: Glucose reference range applies only to samples taken after fasting for at least 8 hours.   BUN 28 (H) 8 - 23 mg/dL   Creatinine, Ser 1.01 (H) 0.44 - 1.00 mg/dL   Calcium 8.3 (L) 8.9 - 10.3 mg/dL   GFR calc non Af Amer 49 (L) >60 mL/min   GFR calc Af Amer 57 (L) >60 mL/min   Anion gap 9 5 - 15    Comment: Performed at [facility], [place] [address], [place], [place] [address]  Basic  metabolic panel     Status: Abnormal   Collection Time: 12/02/19  9:46 AM  Result Value Ref Range   Sodium 156 (H) 135 - 145 mmol/L   Potassium 3.2 (L) 3.5 - 5.1 mmol/L   Chloride 127 (H) 98 - 111 mmol/L   CO2 23 22 - 32 mmol/L   Glucose, Bld 128 (H) 70 - 99 mg/dL    Comment: Glucose reference range applies only to samples taken after fasting for at least 8 hours.   BUN 29 (H) 8 - 23 mg/dL   Creatinine, Ser 1.10 (H) 0.44 - 1.00 mg/dL   Calcium 8.2 (L) 8.9 - 10.3 mg/dL   GFR calc non Af Amer 44 (L) >60 mL/min   GFR calc Af Amer 51 (L) >60 mL/min   Anion gap 6 5 - 15    Comment: Performed at [facility], [place] [address], [place], [place] [address]  Basic metabolic panel     Status: Abnormal   Collection Time: 12/02/19  1:45 PM  Result Value Ref Range   Sodium 155 (H) 135 - 145 mmol/L   Potassium 3.7 3.5 - 5.1 mmol/L   Chloride 125 (H) 98 - 111 mmol/L   CO2 22 22 - 32 mmol/L   Glucose, Bld 154 (H) 70 - 99 mg/dL    Comment: Glucose reference range applies only to samples taken after fasting for at least 8 hours.   BUN 27 (H) 8 - 23 mg/dL   Creatinine, Ser 1.14 (H) 0.44 - 1.00 mg/dL   Calcium 8.3 (L) 8.9 - 10.3 mg/dL   GFR calc non Af Amer 42 (L) >60 mL/min   GFR calc Af Amer 49 (L) >60 mL/min   Anion gap 8 5 - 15    Comment: Performed at [facility], [place] [address], [place], [place] [address]     Recent Results (from the past 240 hour(s))  SARS Coronavirus 2 by RT PCR (hospital order, performed in [facility] hospital lab) Nasopharyngeal Nasopharyngeal Swab     Status: None   Collection Time: 11/28/19  6:30 PM   Specimen: Nasopharyngeal Swab  Result Value Ref Range Status   SARS Coronavirus 2 NEGATIVE NEGATIVE Final    Comment: (NOTE) SARS-CoV-2 target nucleic acids are NOT DETECTED.  The SARS-CoV-2 RNA is generally detectable in upper and lower respiratory specimens during the acute phase of infection. The lowest concentration of  SARS-CoV-2 viral copies this assay can detect is 250 copies / mL. A negative result does not preclude SARS-CoV-2 infection and should not be used as the sole basis for treatment or other patient management decisions.  A negative result may occur with improper specimen collection / handling, submission of specimen other than nasopharyngeal swab, presence of viral mutation(s) within the areas targeted by this assay, and inadequate number of viral copies (<250 copies / mL). A negative result must be  combined with clinical observations, patient history, and epidemiological information.  Fact Sheet for Patients:   StrictlyIdeas.no  Fact Sheet for Healthcare Providers: BankingDealers.co.za  This test is not yet approved or  cleared by the [place] FDA and has been authorized for detection and/or diagnosis of SARS-CoV-2 by FDA under an Emergency Use Authorization (EUA).  This EUA will remain in effect (meaning this test can be used) for the duration of the COVID-19 declaration under Section 564(b)(1) of the Act, 21 U.S.C. section 360bbb-3(b)(1), unless the authorization is terminated or revoked sooner.  Performed at [facility], [place] [address], [place], [place] [address]      Radiology Studies: No results found. DG Chest Port 1 View  Final Result    DG Chest Port 1 View  Final Result       Scheduled Meds: . aspirin  81 mg Oral Daily  . atorvastatin  40 mg Oral Daily  . heparin  5,000 Units Subcutaneous Q8H  . levETIRAcetam  750 mg Oral BID  . metoprolol tartrate  12.5 mg Oral Daily  . multivitamin  15 mL Oral Daily  . sodium chloride flush  3 mL Intravenous Q12H   PRN Meds: acetaminophen **OR** acetaminophen, albuterol, ondansetron **OR** ondansetron (ZOFRAN) IV, Resource ThickenUp Clear Continuous Infusions: . dextrose 50 mL/hr at 12/02/19 0600  . dextrose 5% lactated ringers 50 mL/hr at 12/01/19 1309       LOS: 4 days  Time spent: Greater than 50% of the 35 minute visit was spent in counseling/coordination of care for the patient as laid out in the A&P.   Dwyane Dee, MD [facility] 12/02/2019, 3:21 PM   Contact via secure chat.  To contact the attending provider between 7A-7P or the covering provider during after hours 7P-7A, please log into the web site [email or web address] and access using universal [place] password for that web site. If you do not have the password, please call the hospital operator.

## 2019-12-03 LAB — BASIC METABOLIC PANEL
Anion gap: 8 (ref 5–15)
Anion gap: 4 — ABNORMAL LOW (ref 5–15)
Anion gap: 9 (ref 5–15)
Anion gap: 7 (ref 5–15)
BUN: 23 mg/dL (ref 8–23)
BUN: 21 mg/dL (ref 8–23)
BUN: 21 mg/dL (ref 8–23)
BUN: 20 mg/dL (ref 8–23)
CO2: 24 mmol/L (ref 22–32)
CO2: 23 mmol/L (ref 22–32)
CO2: 23 mmol/L (ref 22–32)
CO2: 26 mmol/L (ref 22–32)
Calcium: 8.3 mg/dL — ABNORMAL LOW (ref 8.9–10.3)
Calcium: 8.1 mg/dL — ABNORMAL LOW (ref 8.9–10.3)
Calcium: 8.3 mg/dL — ABNORMAL LOW (ref 8.9–10.3)
Calcium: 8.4 mg/dL — ABNORMAL LOW (ref 8.9–10.3)
Chloride: 121 mmol/L — ABNORMAL HIGH (ref 98–111)
Chloride: 122 mmol/L — ABNORMAL HIGH (ref 98–111)
Chloride: 121 mmol/L — ABNORMAL HIGH (ref 98–111)
Chloride: 121 mmol/L — ABNORMAL HIGH (ref 98–111)
Creatinine, Ser: 1.19 mg/dL — ABNORMAL HIGH (ref 0.44–1.00)
Creatinine, Ser: 1.02 mg/dL — ABNORMAL HIGH (ref 0.44–1.00)
Creatinine, Ser: 1.04 mg/dL — ABNORMAL HIGH (ref 0.44–1.00)
Creatinine, Ser: 1.17 mg/dL — ABNORMAL HIGH (ref 0.44–1.00)
GFR calc Af Amer: 47 mL/min — ABNORMAL LOW (ref >60)
GFR calc Af Amer: 56 mL/min — ABNORMAL LOW (ref >60)
GFR calc Af Amer: 55 mL/min — ABNORMAL LOW (ref >60)
GFR calc Af Amer: 48 mL/min — ABNORMAL LOW (ref >60)
GFR calc non Af Amer: 40 mL/min — ABNORMAL LOW (ref >60)
GFR calc non Af Amer: 48 mL/min — ABNORMAL LOW (ref >60)
GFR calc non Af Amer: 47 mL/min — ABNORMAL LOW (ref >60)
GFR calc non Af Amer: 41 mL/min — ABNORMAL LOW (ref >60)
Glucose, Bld: 166 mg/dL — ABNORMAL HIGH (ref 70–99)
Glucose, Bld: 147 mg/dL — ABNORMAL HIGH (ref 70–99)
Glucose, Bld: 154 mg/dL — ABNORMAL HIGH (ref 70–99)
Glucose, Bld: 144 mg/dL — ABNORMAL HIGH (ref 70–99)
Potassium: 3.6 mmol/L (ref 3.5–5.1)
Potassium: 3.7 mmol/L (ref 3.5–5.1)
Potassium: 3.4 mmol/L — ABNORMAL LOW (ref 3.5–5.1)
Potassium: 3.7 mmol/L (ref 3.5–5.1)
Sodium: 153 mmol/L — ABNORMAL HIGH (ref 135–145)
Sodium: 149 mmol/L — ABNORMAL HIGH (ref 135–145)
Sodium: 153 mmol/L — ABNORMAL HIGH (ref 135–145)
Sodium: 154 mmol/L — ABNORMAL HIGH (ref 135–145)

## 2019-12-03 LAB — CBC WITH DIFFERENTIAL/PLATELET
Abs Immature Granulocytes: 0.02 10*3/uL (ref 0.00–0.07)
Basophils Absolute: 0 10*3/uL (ref 0.0–0.1)
Basophils Relative: 1 %
Eosinophils Absolute: 0.1 10*3/uL (ref 0.0–0.5)
Eosinophils Relative: 1 %
HCT: 36 % (ref 36.0–46.0)
Hemoglobin: 10.8 g/dL — ABNORMAL LOW (ref 12.0–15.0)
Immature Granulocytes: 0 %
Lymphocytes Relative: 12 %
Lymphs Abs: 0.9 10*3/uL (ref 0.7–4.0)
MCH: 32.5 pg (ref 26.0–34.0)
MCHC: 30 g/dL (ref 30.0–36.0)
MCV: 108.4 fL — ABNORMAL HIGH (ref 80.0–100.0)
Monocytes Absolute: 0.5 10*3/uL (ref 0.1–1.0)
Monocytes Relative: 7 %
Neutro Abs: 5.7 10*3/uL (ref 1.7–7.7)
Neutrophils Relative %: 79 %
Platelets: 94 10*3/uL — ABNORMAL LOW (ref 150–400)
RBC: 3.32 MIL/uL — ABNORMAL LOW (ref 3.87–5.11)
RDW: 16.6 % — ABNORMAL HIGH (ref 11.5–15.5)
WBC: 7.2 10*3/uL (ref 4.0–10.5)
nRBC: 0 % (ref 0.0–0.2)

## 2019-12-03 LAB — GLUCOSE, CAPILLARY
Glucose-Capillary: 145 mg/dL — ABNORMAL HIGH (ref 70–99)
Glucose-Capillary: 120 mg/dL — ABNORMAL HIGH (ref 70–99)

## 2019-12-03 LAB — MAGNESIUM: Magnesium: 2.2 mg/dL (ref 1.7–2.4)

## 2019-12-03 NOTE — Progress Notes (Signed)
PROGRESS NOTE    Kelly Cross   KDX:[id number]  DOB: [birth year]  DOA: 11/28/2019     5  PCP: Leeroy Cha, MD  CC: lethargy, hypernatremia  Hospital Course: Kelly Cross is a 52 AA female with PMH dementia, hypertension, hyperlipidemia, sarcoidosis with no pulmonary involvement, seizure disorder, osteoporosis, CVA, Rectal prolapse status post rectopexy + colostomy creation 2019 who presented with hypernatremia, sodium 158.  She had also been having reported decreased oral intake.  Her baseline is that she is rarely verbal and has significant dementia per nursing staff at [facility].  The patient was more lethargic than usual and was referred to the ER for further evaluation and treatment.  Other history includes: In May patient was admitted to [facility] with seizures felt to be secondary to hypoglycemia as well as prior CVAs in the Alzheimer's induced epilepsy she undergone further evaluation including CT MRI and EEG and was discharged back to facility on Keppra 750 twice daily and her donepezil was discontinued Her hypoglycemia at that time felt to be secondary to accidental overdose with glipizide as her hypoglycemic panel was positive for glipizide She was also noted to have elevated LFTs in June 2021. AST was 121 ALT 193 CT scan of the liver done at that time showed possibility of early cirrhosis With diffuse fatty infiltration of the liver and gallstones early cirrhosis cirrhosis was confirmed with ultrasound of the liver as well.  She was admitted for treatment of her hypernatremia and placed on D5 LR infusion.  Hypernatremia was refractory to D5 LR infusion, therefore D10 was also added on and sodium levels responded.  Her appetite and meal intake remained low/poor throughout hospitalization.   Interval History:  No events overnight.  She is sitting up in bed still appearing comfortable and remains awake/alert at what appears to be her baseline.  Old records  reviewed in assessment of this patient  ROS: Review of systems not obtained due to patient factors.  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  This seems to be her current state as well -will continue monitoring mentation while correcting hypernatremia, but if electrolyte abnormalities correct with no significant change in mentation, this is likely her baseline  AKI (acute kidney injury) ([place]) -Continue fluids and trend BMP - renal function responding to IVF; continue   Chronic systolic CHF (congestive heart failure) (HCC) -EF 35 to 40% on April 2021 echo -Monitor for any signs of volume overload while on fluids  Hypernatremia - refractory to D5LR; will add on D10 and monitor BMP q4h then de-escalate as able - mentation has improved some  -D10 along with D5 LR initiated on 12/02/2019.  Sodium is down trended to 155 after combination therapy -Continue both infusions for another 24 hours while trending BMP.  If improved tomorrow, will consider discharging  Seizure disorder ([place]) -Continue Keppra  Anemia - iron levels adequate; no further workup at this time   Antimicrobials: n/a  DVT prophylaxis: HSQ Code Status: DNR Family Communication: none Disposition Plan:  . Patient came from: SNF        . Barriers to d/c OR conditions which need to be met to effect a safe d/c: none currently . The current disposition plan is discharge to: SNF Sunday   Objective: Vitals:   12/02/19 1237 12/02/19 2200 12/03/19 0252 12/03/19 1057  BP: 118/79  126/78 123/67   Pulse: 70 70 (!) 57 79  Resp: (!) 23 20    Temp: 97.7 F (36.5 C) 98.1 F (36.7 C) 97.8 F (36.6 C)   TempSrc: Oral Oral Oral   SpO2: 100% 98% 93%   Weight:      Height:        Intake/Output Summary (Last 24 hours) at 12/03/2019  1350 Last data filed at 12/03/2019 3419 Gross per 24 hour  Intake 1171.75 ml  Output 0 ml  Net 1171.75 ml   Filed Weights   11/28/19 2210  Weight: 57.2 kg    Examination: General appearance: Little more awake and alert, following some commands Head: Normocephalic, without obvious abnormality Eyes: Obvious protrusion of eyeballs almost exophthalmos Lungs: clear to auscultation bilaterally Heart: regular rate and rhythm and S1, S2 normal Abdomen: normal findings: bowel sounds normal Extremities: No edema Skin: mobility and turgor normal Neurologic: Moves all 4 extremities spontaneously.  More verbal today and following some commands  Consultants:   N/A  Procedures:   N/A  Data Reviewed: I have personally reviewed following labs and imaging studies Results for orders placed or performed during the hospital encounter of 11/28/19 (from the past 72 hour(s))  Basic metabolic panel     Status: Abnormal   Collection Time: 11/30/19  3:30 PM  Result Value Ref Range   Sodium 160 (H) 135 - 145 mmol/L   Potassium 3.5 3.5 - 5.1 mmol/L   Chloride 128 (H) 98 - 111 mmol/L   CO2 22 22 - 32 mmol/L   Glucose, Bld 148 (H) 70 - 99 mg/dL    Comment: Glucose reference range applies only to samples taken after fasting for at least 8 hours.   BUN 38 (H) 8 - 23 mg/dL   Creatinine, Ser 1.14 (H) 0.44 - 1.00 mg/dL   Calcium 8.6 (L) 8.9 - 10.3 mg/dL   GFR calc non Af Amer 42 (L) >60 mL/min   GFR calc Af Amer 49 (L) >60 mL/min   Anion gap 10 5 - 15    Comment: Performed at [facility], [place] [address] [place], [place] [address]  Basic metabolic panel     Status: Abnormal   Collection Time: 11/30/19 10:02 PM  Result Value Ref Range   Sodium 160 (H) 135 - 145 mmol/L   Potassium 3.8 3.5 - 5.1 mmol/L   Chloride 129 (H) 98 - 111 mmol/L   CO2 23 22 - 32 mmol/L   Glucose, Bld 98 70 - 99 mg/dL    Comment: Glucose reference range applies only to samples taken after fasting for at  least 8 hours.   BUN 36 (H) 8 - 23 mg/dL   Creatinine, Ser 1.10 (H) 0.44 - 1.00 mg/dL   Calcium 8.5 (L) 8.9 - 10.3 mg/dL   GFR calc non Af Amer 44 (L) >60 mL/min   GFR calc Af Amer 51 (L) >60 mL/min   Anion gap 8 5 - 15    Comment: Performed at [facility], [place] [address] [place], [place] [address]  CBC with Differential/Platelet     Status: Abnormal   Collection Time: 12/01/19  3:35 AM  Result Value Ref Range   WBC 7.8 4.0 - 10.5 K/uL   RBC 3.32 (L) 3.87 - 5.11 MIL/uL   Hemoglobin 11.0 (L) 12.0 - 15.0 g/dL   HCT 35.3 (L) 36 - 46 %   MCV 106.3 (H) 80.0 - 100.0 fL   MCH 33.1 26.0 -  34.0 pg   MCHC 31.2 30.0 - 36.0 g/dL   RDW 16.7 (H) 11.5 - 15.5 %   Platelets 83 (L) 150 - 400 K/uL    Comment: CONSISTENT WITH PREVIOUS RESULT Immature Platelet Fraction may be clinically indicated, consider ordering this additional test [id number] REPEATED TO VERIFY    nRBC 0.0 0.0 - 0.2 %   Neutrophils Relative % 67 %   Neutro Abs 5.3 1.7 - 7.7 K/uL   Lymphocytes Relative 19 %   Lymphs Abs 1.5 0.7 - 4.0 K/uL   Monocytes Relative 10 %   Monocytes Absolute 0.8 0 - 1 K/uL   Eosinophils Relative 3 %   Eosinophils Absolute 0.2 0 - 0 K/uL   Basophils Relative 1 %   Basophils Absolute 0.1 0 - 0 K/uL   Immature Granulocytes 0 %   Abs Immature Granulocytes 0.02 0.00 - 0.07 K/uL    Comment: Performed at [facility], [place] [address] [place], [place] [address]  Magnesium     Status: None   Collection Time: 12/01/19  3:35 AM  Result Value Ref Range   Magnesium 2.4 1.7 - 2.4 mg/dL    Comment: Performed at [facility], [place] [address], [place], [place] [address]  Basic metabolic panel     Status: Abnormal   Collection Time: 12/01/19  3:35 AM  Result Value Ref Range   Sodium 160 (H) 135 - 145 mmol/L   Potassium 4.0 3.5 - 5.1 mmol/L   Chloride 129 (H) 98 - 111 mmol/L   CO2 22 22 - 32 mmol/L   Glucose, Bld 107 (H) 70 - 99 mg/dL    Comment:  Glucose reference range applies only to samples taken after fasting for at least 8 hours.   BUN 35 (H) 8 - 23 mg/dL   Creatinine, Ser 1.13 (H) 0.44 - 1.00 mg/dL   Calcium 8.5 (L) 8.9 - 10.3 mg/dL   GFR calc non Af Amer 43 (L) >60 mL/min   GFR calc Af Amer 50 (L) >60 mL/min   Anion gap 9 5 - 15    Comment: Performed at [facility], [place] [address] [place], [place] [address]  Basic metabolic panel     Status: Abnormal   Collection Time: 12/01/19 10:16 AM  Result Value Ref Range   Sodium 159 (H) 135 - 145 mmol/L   Potassium 3.6 3.5 - 5.1 mmol/L   Chloride 128 (H) 98 - 111 mmol/L   CO2 24 22 - 32 mmol/L   Glucose, Bld 140 (H) 70 - 99 mg/dL    Comment: Glucose reference range applies only to samples taken after fasting for at least 8 hours.   BUN 34 (H) 8 - 23 mg/dL   Creatinine, Ser 1.13 (H) 0.44 - 1.00 mg/dL   Calcium 8.7 (L) 8.9 - 10.3 mg/dL   GFR calc non Af Amer 43 (L) >60 mL/min   GFR calc Af Amer 50 (L) >60 mL/min   Anion gap 7 5 - 15    Comment: Performed at [facility], [place] [address] [place], [place] [address]  Basic metabolic panel     Status: Abnormal   Collection Time: 12/01/19  2:00 PM  Result Value Ref Range   Sodium 159 (H) 135 - 145 mmol/L   Potassium 3.4 (L) 3.5 - 5.1 mmol/L   Chloride 130 (H) 98 - 111 mmol/L   CO2 25 22 - 32 mmol/L   Glucose, Bld 121 (H)  70 - 99 mg/dL    Comment: Glucose reference range applies only to samples taken after fasting for at least 8 hours.   BUN 34 (H) 8 - 23 mg/dL   Creatinine, Ser 1.11 (H) 0.44 - 1.00 mg/dL   Calcium 8.7 (L) 8.9 - 10.3 mg/dL   GFR calc non Af Amer 44 (L) >60 mL/min   GFR calc Af Amer 51 (L) >60 mL/min   Anion gap 4 (L) 5 - 15    Comment: Performed at [facility], [place] [address], [place], [place] [address]  Basic metabolic panel     Status: Abnormal   Collection Time: 12/01/19  5:48 PM  Result Value Ref Range   Sodium 159 (H) 135 - 145 mmol/L    Potassium 3.2 (L) 3.5 - 5.1 mmol/L   Chloride 128 (H) 98 - 111 mmol/L   CO2 25 22 - 32 mmol/L   Glucose, Bld 121 (H) 70 - 99 mg/dL    Comment: Glucose reference range applies only to samples taken after fasting for at least 8 hours.   BUN 33 (H) 8 - 23 mg/dL   Creatinine, Ser 1.11 (H) 0.44 - 1.00 mg/dL   Calcium 8.4 (L) 8.9 - 10.3 mg/dL   GFR calc non Af Amer 44 (L) >60 mL/min   GFR calc Af Amer 51 (L) >60 mL/min   Anion gap 6 5 - 15    Comment: Performed at [facility], [place] [address], [place], [place] [address]  Basic metabolic panel     Status: Abnormal   Collection Time: 12/01/19  9:47 PM  Result Value Ref Range   Sodium 157 (H) 135 - 145 mmol/L   Potassium 3.6 3.5 - 5.1 mmol/L   Chloride 124 (H) 98 - 111 mmol/L   CO2 24 22 - 32 mmol/L   Glucose, Bld 149 (H) 70 - 99 mg/dL    Comment: Glucose reference range applies only to samples taken after fasting for at least 8 hours.   BUN 30 (H) 8 - 23 mg/dL   Creatinine, Ser 1.08 (H) 0.44 - 1.00 mg/dL   Calcium 8.5 (L) 8.9 - 10.3 mg/dL   GFR calc non Af Amer 45 (L) >60 mL/min   GFR calc Af Amer 52 (L) >60 mL/min   Anion gap 9 5 - 15    Comment: Performed at [facility], [place] [address] [place], [place] [address]  Basic metabolic panel     Status: Abnormal   Collection Time: 12/02/19  2:14 AM  Result Value Ref Range   Sodium 158 (H) 135 - 145 mmol/L   Potassium 3.3 (L) 3.5 - 5.1 mmol/L   Chloride 129 (H) 98 - 111 mmol/L   CO2 23 22 - 32 mmol/L   Glucose, Bld 130 (H) 70 - 99 mg/dL    Comment: Glucose reference range applies only to samples taken after fasting for at least 8 hours.   BUN 29 (H) 8 - 23 mg/dL   Creatinine, Ser 1.01 (H) 0.44 - 1.00 mg/dL   Calcium 8.5 (L) 8.9 - 10.3 mg/dL   GFR calc non Af Amer 49 (L) >60 mL/min   GFR calc Af Amer 57 (L) >60 mL/min   Anion gap 6 5 - 15    Comment: Performed at [facility], [place] [address] [place], [place] [address]  CBC with  Differential/Platelet     Status: Abnormal   Collection Time: 12/02/19  2:14 AM  Result Value Ref Range   WBC 8.6 4.0 - 10.5 K/uL   RBC 3.39 (L) 3.87 - 5.11 MIL/uL   Hemoglobin 11.2 (L) 12.0 - 15.0 g/dL   HCT 36.4 36 - 46 %   MCV 107.4 (H) 80.0 - 100.0 fL   MCH 33.0 26.0 - 34.0 pg   MCHC 30.8 30.0 - 36.0 g/dL   RDW 16.6 (H) 11.5 - 15.5 %   Platelets 91 (L) 150 - 400 K/uL    Comment: SPECIMEN CHECKED FOR CLOTS CONSISTENT WITH PREVIOUS RESULT Immature Platelet Fraction may be clinically indicated, consider ordering this additional test [id number]    nRBC 0.0 0.0 - 0.2 %   Neutrophils Relative % 65 %   Neutro Abs 5.6 1.7 - 7.7 K/uL   Lymphocytes Relative 21 %   Lymphs Abs 1.8 0.7 - 4.0 K/uL   Monocytes Relative 10 %   Monocytes Absolute 0.8 0 - 1 K/uL   Eosinophils Relative 3 %   Eosinophils Absolute 0.3 0 - 0 K/uL   Basophils Relative 1 %   Basophils Absolute 0.1 0 - 0 K/uL   Immature Granulocytes 0 %   Abs Immature Granulocytes 0.02 0.00 - 0.07 K/uL    Comment: Performed at [facility], [place] [address], [place], [place] [address]  Magnesium     Status: None   Collection Time: 12/02/19  2:14 AM  Result Value Ref Range   Magnesium 2.2 1.7 - 2.4 mg/dL    Comment: Performed at [facility], [place] [address], [place], [place] [address]  Basic metabolic panel     Status: Abnormal   Collection Time: 12/02/19  5:42 AM  Result Value Ref Range   Sodium 159 (H) 135 - 145 mmol/L   Potassium 3.3 (L) 3.5 - 5.1 mmol/L   Chloride 126 (H) 98 - 111 mmol/L   CO2 24 22 - 32 mmol/L   Glucose, Bld 125 (H) 70 - 99 mg/dL    Comment: Glucose reference range applies only to samples taken after fasting for at least 8 hours.   BUN 28 (H) 8 - 23 mg/dL   Creatinine, Ser 1.01 (H) 0.44 - 1.00 mg/dL   Calcium 8.3 (L) 8.9 - 10.3 mg/dL   GFR calc non Af Amer 49 (L) >60 mL/min   GFR calc Af Amer 57 (L) >60 mL/min   Anion gap 9 5 - 15    Comment: Performed at [facility], [place] [address] [place], [place] [address]  Basic metabolic panel     Status: Abnormal   Collection Time: 12/02/19  9:46 AM  Result Value Ref Range   Sodium 156 (H) 135 - 145 mmol/L   Potassium 3.2 (L) 3.5 - 5.1 mmol/L   Chloride 127 (H) 98 - 111 mmol/L   CO2 23 22 - 32 mmol/L   Glucose, Bld 128 (H) 70 - 99 mg/dL    Comment: Glucose reference range applies only to samples taken after fasting for at least 8 hours.   BUN 29 (H) 8 - 23 mg/dL   Creatinine, Ser 1.10 (H) 0.44 - 1.00 mg/dL   Calcium 8.2 (L) 8.9 - 10.3 mg/dL   GFR calc non Af Amer 44 (L) >60 mL/min   GFR calc Af Amer 51 (L) >60 mL/min   Anion gap 6 5 - 15    Comment: Performed at [facility], [place] [address] [place], [place] [address]  Basic metabolic panel  Status: Abnormal   Collection Time: 12/02/19  1:45 PM  Result Value Ref Range   Sodium 155 (H) 135 - 145 mmol/L   Potassium 3.7 3.5 - 5.1 mmol/L   Chloride 125 (H) 98 - 111 mmol/L   CO2 22 22 - 32 mmol/L   Glucose, Bld 154 (H) 70 - 99 mg/dL    Comment: Glucose reference range applies only to samples taken after fasting for at least 8 hours.   BUN 27 (H) 8 - 23 mg/dL   Creatinine, Ser 1.14 (H) 0.44 - 1.00 mg/dL   Calcium 8.3 (L) 8.9 - 10.3 mg/dL   GFR calc non Af Amer 42 (L) >60 mL/min   GFR calc Af Amer 49 (L) >60 mL/min   Anion gap 8 5 - 15    Comment: Performed at [facility], [place] [address], [place], [place] [address]  Basic metabolic panel     Status: Abnormal   Collection Time: 12/02/19  9:17 PM  Result Value Ref Range   Sodium 153 (H) 135 - 145 mmol/L   Potassium 3.6 3.5 - 5.1 mmol/L   Chloride 124 (H) 98 - 111 mmol/L   CO2 24 22 - 32 mmol/L   Glucose, Bld 143 (H) 70 - 99 mg/dL    Comment: Glucose reference range applies only to samples taken after fasting for at least 8 hours.   BUN 25 (H) 8 - 23 mg/dL   Creatinine, Ser 1.13 (H) 0.44 - 1.00 mg/dL   Calcium 8.3 (L) 8.9 - 10.3 mg/dL    GFR calc non Af Amer 43 (L) >60 mL/min   GFR calc Af Amer 50 (L) >60 mL/min   Anion gap 5 5 - 15    Comment: Performed at [facility], [place] [address], [place], [place] [address]  Glucose, capillary     Status: Abnormal   Collection Time: 12/03/19  1:59 AM  Result Value Ref Range   Glucose-Capillary 145 (H) 70 - 99 mg/dL    Comment: Glucose reference range applies only to samples taken after fasting for at least 8 hours.  Basic metabolic panel     Status: Abnormal   Collection Time: 12/03/19  2:07 AM  Result Value Ref Range   Sodium 153 (H) 135 - 145 mmol/L   Potassium 3.6 3.5 - 5.1 mmol/L   Chloride 121 (H) 98 - 111 mmol/L   CO2 24 22 - 32 mmol/L   Glucose, Bld 166 (H) 70 - 99 mg/dL    Comment: Glucose reference range applies only to samples taken after fasting for at least 8 hours.   BUN 23 8 - 23 mg/dL   Creatinine, Ser 1.19 (H) 0.44 - 1.00 mg/dL   Calcium 8.3 (L) 8.9 - 10.3 mg/dL   GFR calc non Af Amer 40 (L) >60 mL/min   GFR calc Af Amer 47 (L) >60 mL/min   Anion gap 8 5 - 15    Comment: Performed at [facility], [place] [address], [place], [place] [address]  CBC with Differential/Platelet     Status: Abnormal   Collection Time: 12/03/19  2:07 AM  Result Value Ref Range   WBC 7.2 4.0 - 10.5 K/uL   RBC 3.32 (L) 3.87 - 5.11 MIL/uL   Hemoglobin 10.8 (L) 12.0 - 15.0 g/dL   HCT 36.0 36 - 46 %   MCV 108.4 (H) 80.0 - 100.0 fL   MCH 32.5 26.0 - 34.0 pg   MCHC 30.0  30.0 - 36.0 g/dL   RDW 16.6 (H) 11.5 - 15.5 %   Platelets 94 (L) 150 - 400 K/uL    Comment: SPECIMEN CHECKED FOR CLOTS CONSISTENT WITH PREVIOUS RESULT Immature Platelet Fraction may be clinically indicated, consider ordering this additional test [id number] REPEATED TO VERIFY    nRBC 0.0 0.0 - 0.2 %   Neutrophils Relative % 79 %   Neutro Abs 5.7 1.7 - 7.7 K/uL   Lymphocytes Relative 12 %   Lymphs Abs 0.9 0.7 - 4.0 K/uL   Monocytes Relative 7 %   Monocytes Absolute 0.5 0 - 1  K/uL   Eosinophils Relative 1 %   Eosinophils Absolute 0.1 0 - 0 K/uL   Basophils Relative 1 %   Basophils Absolute 0.0 0 - 0 K/uL   Immature Granulocytes 0 %   Abs Immature Granulocytes 0.02 0.00 - 0.07 K/uL    Comment: Performed at [facility], [place] [address], [place], [place] [address]  Magnesium     Status: None   Collection Time: 12/03/19  2:07 AM  Result Value Ref Range   Magnesium 2.2 1.7 - 2.4 mg/dL    Comment: Performed at [facility], [place] [address], [place], [place] [address]  Basic metabolic panel     Status: Abnormal   Collection Time: 12/03/19  7:44 AM  Result Value Ref Range   Sodium 149 (H) 135 - 145 mmol/L   Potassium 3.7 3.5 - 5.1 mmol/L   Chloride 122 (H) 98 - 111 mmol/L   CO2 23 22 - 32 mmol/L   Glucose, Bld 147 (H) 70 - 99 mg/dL    Comment: Glucose reference range applies only to samples taken after fasting for at least 8 hours.   BUN 21 8 - 23 mg/dL   Creatinine, Ser 1.02 (H) 0.44 - 1.00 mg/dL   Calcium 8.1 (L) 8.9 - 10.3 mg/dL   GFR calc non Af Amer 48 (L) >60 mL/min   GFR calc Af Amer 56 (L) >60 mL/min   Anion gap 4 (L) 5 - 15    Comment: Performed at [facility], [place] [address], [place], [place] [address]     Recent Results (from the past 240 hour(s))  SARS Coronavirus 2 by RT PCR (hospital order, performed in [facility] hospital lab) Nasopharyngeal Nasopharyngeal Swab     Status: None   Collection Time: 11/28/19  6:30 PM   Specimen: Nasopharyngeal Swab  Result Value Ref Range Status   SARS Coronavirus 2 NEGATIVE NEGATIVE Final    Comment: (NOTE) SARS-CoV-2 target nucleic acids are NOT DETECTED.  The SARS-CoV-2 RNA is generally detectable in upper and lower respiratory specimens during the acute phase of infection. The lowest concentration of SARS-CoV-2 viral copies this assay can detect is 250 copies / mL. A negative result does not preclude SARS-CoV-2 infection and should not be used as  the sole basis for treatment or other patient management decisions.  A negative result may occur with improper specimen collection / handling, submission of specimen other than nasopharyngeal swab, presence of viral mutation(s) within the areas targeted by this assay, and inadequate number of viral copies (<250 copies / mL). A negative result must be combined with clinical observations, patient history, and epidemiological information.  Fact Sheet for Patients:   StrictlyIdeas.no  Fact Sheet for Healthcare Providers: BankingDealers.co.za  This test is not yet approved or  cleared by the [place] FDA and has been authorized for detection and/or  diagnosis of SARS-CoV-2 by FDA under an Emergency Use Authorization (EUA).  This EUA will remain in effect (meaning this test can be used) for the duration of the COVID-19 declaration under Section 564(b)(1) of the Act, 21 U.S.C. section 360bbb-3(b)(1), unless the authorization is terminated or revoked sooner.  Performed at [facility], [place] [address], [place], [place] [address]      Radiology Studies: No results found. DG Chest Port 1 View  Final Result    DG Chest Port 1 View  Final Result       Scheduled Meds: . aspirin  81 mg Oral Daily  . atorvastatin  40 mg Oral Daily  . heparin  5,000 Units Subcutaneous Q8H  . levETIRAcetam  750 mg Oral BID  . metoprolol tartrate  12.5 mg Oral Daily  . multivitamin  15 mL Oral Daily  . sodium chloride flush  3 mL Intravenous Q12H   PRN Meds: acetaminophen **OR** acetaminophen, albuterol, ondansetron **OR** ondansetron (ZOFRAN) IV, Resource ThickenUp Clear Continuous Infusions: . dextrose 50 mL/hr at 12/03/19 0308  . dextrose 5% lactated ringers 50 mL/hr at 12/03/19 1317      LOS: 5 days  Time spent: Greater than 50% of the 35 minute visit was spent in counseling/coordination of care for the patient as laid out in  the A&P.   Dwyane Dee, MD [facility] 12/03/2019, 1:50 PM   Contact via secure chat.  To contact the attending provider between 7A-7P or the covering provider during after hours 7P-7A, please log into the web site [email or web address] and access using universal [place] password for that web site. If you do not have the password, please call the hospital operator.

## 2019-12-04 LAB — BASIC METABOLIC PANEL
Anion gap: 7 (ref 5–15)
Anion gap: 10 (ref 5–15)
Anion gap: 7 (ref 5–15)
BUN: 18 mg/dL (ref 8–23)
BUN: 17 mg/dL (ref 8–23)
BUN: 15 mg/dL (ref 8–23)
CO2: 25 mmol/L (ref 22–32)
CO2: 23 mmol/L (ref 22–32)
CO2: 25 mmol/L (ref 22–32)
Calcium: 8.2 mg/dL — ABNORMAL LOW (ref 8.9–10.3)
Calcium: 8.4 mg/dL — ABNORMAL LOW (ref 8.9–10.3)
Calcium: 8.1 mg/dL — ABNORMAL LOW (ref 8.9–10.3)
Chloride: 120 mmol/L — ABNORMAL HIGH (ref 98–111)
Chloride: 119 mmol/L — ABNORMAL HIGH (ref 98–111)
Chloride: 117 mmol/L — ABNORMAL HIGH (ref 98–111)
Creatinine, Ser: 1.09 mg/dL — ABNORMAL HIGH (ref 0.44–1.00)
Creatinine, Ser: 1.06 mg/dL — ABNORMAL HIGH (ref 0.44–1.00)
Creatinine, Ser: 1.04 mg/dL — ABNORMAL HIGH (ref 0.44–1.00)
GFR calc Af Amer: 52 mL/min — ABNORMAL LOW (ref >60)
GFR calc Af Amer: 54 mL/min — ABNORMAL LOW (ref >60)
GFR calc Af Amer: 55 mL/min — ABNORMAL LOW (ref >60)
GFR calc non Af Amer: 45 mL/min — ABNORMAL LOW (ref >60)
GFR calc non Af Amer: 46 mL/min — ABNORMAL LOW (ref >60)
GFR calc non Af Amer: 47 mL/min — ABNORMAL LOW (ref >60)
Glucose, Bld: 156 mg/dL — ABNORMAL HIGH (ref 70–99)
Glucose, Bld: 149 mg/dL — ABNORMAL HIGH (ref 70–99)
Glucose, Bld: 151 mg/dL — ABNORMAL HIGH (ref 70–99)
Potassium: 3.5 mmol/L (ref 3.5–5.1)
Potassium: 3.8 mmol/L (ref 3.5–5.1)
Potassium: 3.2 mmol/L — ABNORMAL LOW (ref 3.5–5.1)
Sodium: 152 mmol/L — ABNORMAL HIGH (ref 135–145)
Sodium: 152 mmol/L — ABNORMAL HIGH (ref 135–145)
Sodium: 149 mmol/L — ABNORMAL HIGH (ref 135–145)

## 2019-12-04 LAB — CBC WITH DIFFERENTIAL/PLATELET
Abs Immature Granulocytes: 0.02 10*3/uL (ref 0.00–0.07)
Basophils Absolute: 0 10*3/uL (ref 0.0–0.1)
Basophils Relative: 0 %
Eosinophils Absolute: 0.1 10*3/uL (ref 0.0–0.5)
Eosinophils Relative: 1 %
HCT: 35.2 % — ABNORMAL LOW (ref 36.0–46.0)
Hemoglobin: 10.9 g/dL — ABNORMAL LOW (ref 12.0–15.0)
Immature Granulocytes: 0 %
Lymphocytes Relative: 11 %
Lymphs Abs: 1 10*3/uL (ref 0.7–4.0)
MCH: 33.1 pg (ref 26.0–34.0)
MCHC: 31 g/dL (ref 30.0–36.0)
MCV: 107 fL — ABNORMAL HIGH (ref 80.0–100.0)
Monocytes Absolute: 0.6 10*3/uL (ref 0.1–1.0)
Monocytes Relative: 8 %
Neutro Abs: 6.7 10*3/uL (ref 1.7–7.7)
Neutrophils Relative %: 80 %
Platelets: 92 10*3/uL — ABNORMAL LOW (ref 150–400)
RBC: 3.29 MIL/uL — ABNORMAL LOW (ref 3.87–5.11)
RDW: 16.3 % — ABNORMAL HIGH (ref 11.5–15.5)
WBC: 8.5 10*3/uL (ref 4.0–10.5)
nRBC: 0 % (ref 0.0–0.2)

## 2019-12-04 LAB — MAGNESIUM: Magnesium: 2.1 mg/dL (ref 1.7–2.4)

## 2019-12-04 LAB — GLUCOSE, CAPILLARY
Glucose-Capillary: 143 mg/dL — ABNORMAL HIGH (ref 70–99)
Glucose-Capillary: 124 mg/dL — ABNORMAL HIGH (ref 70–99)

## 2019-12-04 NOTE — Discharge Summary (Addendum)
Physician Discharge Summary  Kelly Cross QIW:[id number] DOB: [birth year] DOA: 11/28/2019  PCP: Lorenda Ishihara, MD  Admit date: 11/28/2019 Discharge date: 12/05/2019  Admitted From: SNF Disposition:  SNF Discharging physician: Lewie Chamber, MD  Recommendations for Outpatient Follow-up:  1. Strongly consider involving hospice and pursuing comfort care  Patient discharged to SNF in Discharge Condition: fair CODE STATUS: DNR Diet recommendation:  Diet Orders (From admission, onward)    Start     Ordered   11/29/19 1530  DIET - DYS 1 Room service appropriate? No; Fluid consistency: Nectar Thick  Diet effective now       Question Answer Comment  Room service appropriate? No   Fluid consistency: Nectar Thick      11/29/19 1529          Hospital Course: Kelly Cross is a 14 AA female with PMH dementia, hypertension, hyperlipidemia, sarcoidosis with no pulmonary involvement, seizure disorder, osteoporosis, CVA, Rectal prolapse status post rectopexy + colostomy creation 2019 who presented with hypernatremia, sodium 158.  She had also been having reported decreased oral intake and change in mentation.  Her baseline is that she is rarely verbal and has significant dementia per nursing staff at [facility].  The patient was more lethargic than usual and was referred to the ER for further evaluation and treatment.  Other history includes: In May patient was admitted to [facility] with seizures felt to be secondary to hypoglycemia as well as prior CVAs in the Alzheimer's induced epilepsy she undergone further evaluation including CT MRI and EEG and was discharged back to facility on Keppra 750 twice daily and her donepezil was discontinued Her hypoglycemia at that time felt to be secondary to accidental overdose with glipizide as her hypoglycemic panel was positive for glipizide She was also noted to have elevated LFTs in June 2021. AST was 121 ALT 193 CT scan of the liver done at  that time showed possibility of early cirrhosis With diffuse fatty infiltration of the liver and gallstones early cirrhosis cirrhosis was confirmed with ultrasound of the liver as well.  She was admitted for treatment of her hypernatremia and placed on D5 LR infusion.  Hypernatremia was refractory to D5 LR infusion, therefore D10 was also added on and sodium levels responded some but never fully normalized.  Her appetite and meal intake remained low/poor throughout hospitalization. Her mentation did improve and she was able to interact some and answer some questions. She was considered back to her baseline mentation and felt to be stable for d/c back to SNF. However, she does have an extremely poor quality of life and appears to be declining slowly as expected in setting of advanced dementia. Would strongly recommend that hospice be involved in her care with pursuit of comfort as she is expected to continue to decline and not benefit from ongoing futile medical care.    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  This seems to be her current state as well -will continue monitoring mentation while correcting hypernatremia, but if electrolyte abnormalities correct with no significant change in mentation, this is likely her baseline - strongly recommend hospice and comfort care; she responded some to infusions however she is likely to continue to decline; ongoing medical care seems futile at this juncture   AKI (acute kidney injury) (HCC) -Continue fluids and trend BMP - renal function responding to IVF; continue  Chronic systolic CHF (congestive heart failure) (HCC) -EF 35 to 40% on April 2021 echo -Monitor for any signs of volume overload while on fluids  Hypernatremia - refractory to D5LR; will add on D10 and monitor BMP q4h then de-escalate as  able - mentation has improved some and is considered back to baseline   Seizure disorder (HCC) -Continue Keppra  Anemia - iron levels adequate; no further workup at this time    The patient's chronic medical conditions were treated accordingly per the patient's home medication regimen except as noted.  On day of discharge, patient was felt deemed stable for discharge.  Discharge Diagnoses:   Principal Diagnosis: Hypernatremia  Active Hospital Problems   Diagnosis Date Noted  . Hypernatremia 11/28/2019    Priority: High  . 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 Chronic systolic CHF (congestive heart failure) (HCC) 11/28/2019  . Anemia 11/28/2019  . Seizure disorder (HCC) 11/28/2019  . High cholesterol   . Anxiety     Resolved Hospital Problems   Diagnosis Date Noted Date Resolved  . AKI (acute kidney injury) (HCC) 11/28/2019 12/04/2019    Priority: Low    Discharge Instructions    Increase activity slowly   Complete by: As directed      Allergies as of 12/05/2019      Reactions   Warfarin And Related Other (See Comments)   unknown      Medication List    STOP taking these medications   atorvastatin 40 MG tablet Commonly known as: LIPITOR   omeprazole 20 MG capsule Commonly known as: PRILOSEC     TAKE these medications   aspirin 81 MG chewable tablet Chew 81 mg by mouth daily.   Belsomra 5 MG Tabs Generic drug: Suvorexant Take 5 mg by mouth at bedtime.   levETIRAcetam 100 MG/ML solution Commonly known as: KEPPRA Take 750 mg by mouth 2 (two) times daily.   lisinopril 5 MG tablet Commonly known as: ZESTRIL Take 5 mg by mouth daily.   memantine 10 MG tablet Commonly known as: NAMENDA Take 10 mg by mouth 2 (two) times daily.   metoprolol tartrate 25 MG tablet Commonly known as: LOPRESSOR Take 12.5 mg by mouth daily.   polyethylene glycol 17 g packet Commonly known as: MIRALAX /  GLYCOLAX Take 17 g by mouth daily.   potassium chloride SA 20 MEQ tablet Commonly known as: KLOR-CON Take 20 mEq by mouth every other day.   sertraline 100 MG tablet Commonly known as: ZOLOFT Take 100 mg by mouth daily.       Allergies  Allergen Reactions  . Warfarin And Related Other (See Comments)    unknown   Consultations: Palliative Care   Procedures/Studies: DG Chest Port 1 View  Result Date: 11/30/2019 CLINICAL DATA:  Increased confusion this morning, history pneumonia, Alzheimer's, sarcoidosis, cirrhosis, CHF, hypertension EXAM: PORTABLE CHEST 1 VIEW COMPARISON:  Portable exam 0836 hours compared to 11/28/2019 FINDINGS: Enlargement of cardiac silhouette with pulmonary vascular congestion. Atherosclerotic calcification aorta. In increased RIGHT pleural effusion and basilar atelectasis. Minimal effusion and atelectasis at LEFT base. No pneumothorax. Bones demineralized with mild degenerative disc disease changes thoracic spine. IMPRESSION: Bibasilar pleural effusions and atelectasis LEFT greater than RIGHT, slightly increased on LEFT since prior study. Enlargement of cardiac silhouette with pulmonary vascular congestion. Electronically Signed   By: Ulyses Southward M.D.   On: 11/30/2019 09:15   DG Chest Port 1 View  Result Date:  11/28/2019 CLINICAL DATA:  Shortness of breath. EXAM: PORTABLE CHEST 1 VIEW COMPARISON:  None. FINDINGS: Mild atelectasis and/or in the bilateral lower lobes. Small to moderate sized bilateral pleural effusions are seen, right greater than left. No pneumothorax is identified. The cardiac silhouette is moderately enlarged. The visualized skeletal structures are unremarkable. IMPRESSION: 1. Small to moderate sized bilateral pleural effusions, right greater than left. 2. Mild bilateral lower lobes atelectasis and/or infiltrate. 3. Moderate cardiomegaly. Electronically Signed   By: Aram Candela M.D.   On: 11/28/2019 18:04    Discharge Exam: BP (!) 127/108  (BP Location: Left Arm)   Pulse 88   Temp 97.6 F (36.4 C) (Oral)   Resp 20   Ht 5' 2.99" (1.6 m)   Wt 57.2 kg   SpO2 97%   BMI 22.34 kg/m  General appearance: Little more awake and alert, following some commands Head: Normocephalic, without obvious abnormality Eyes: Obvious protrusion of eyeballs almost exophthalmos Lungs: clear to auscultation bilaterally Heart: regular rate and rhythm and S1, S2 normal Abdomen: normal findings: bowel sounds normal Extremities: No edema Skin: mobility and turgor normal Neurologic: Moves all 4 extremities spontaneously.  More verbal today and following some commands  The results of significant diagnostics from this hospitalization (including imaging, microbiology, ancillary and laboratory) are listed below for reference.     Microbiology: Recent Results (from the past 240 hour(s))  SARS Coronavirus 2 by RT PCR (hospital order, performed in [facility] hospital lab) Nasopharyngeal Nasopharyngeal Swab     Status: None   Collection Time: 11/28/19  6:30 PM   Specimen: Nasopharyngeal Swab  Result Value Ref Range Status   SARS Coronavirus 2 NEGATIVE NEGATIVE Final    Comment: (NOTE) SARS-CoV-2 target nucleic acids are NOT DETECTED.  The SARS-CoV-2 RNA is generally detectable in upper and lower respiratory specimens during the acute phase of infection. The lowest concentration of SARS-CoV-2 viral copies this assay can detect is 250 copies / mL. A negative result does not preclude SARS-CoV-2 infection and should not be used as the sole basis for treatment or other patient management decisions.  A negative result may occur with improper specimen collection / handling, submission of specimen other than nasopharyngeal swab, presence of viral mutation(s) within the areas targeted by this assay, and inadequate number of viral copies (<250 copies / mL). A negative result must be combined with clinical observations, patient history, and epidemiological  information.  Fact Sheet for Patients:   BoilerBrush.com.cy  Fact Sheet for Healthcare Providers: https://pope.com/  This test is not yet approved or  cleared by the [place] FDA and has been authorized for detection and/or diagnosis of SARS-CoV-2 by FDA under an Emergency Use Authorization (EUA).  This EUA will remain in effect (meaning this test can be used) for the duration of the COVID-19 declaration under Section 564(b)(1) of the Act, 21 U.S.C. section 360bbb-3(b)(1), unless the authorization is terminated or revoked sooner.  Performed at [facility], [address] [place], [place] [address]      Labs: BNP (last 3 results) Recent Labs    11/28/19 1703  BNP >4,500.0*   Basic Metabolic Panel: Recent Labs  Lab 11/28/19 1935 11/28/19 2352 11/29/19 0240 11/29/19 0540 12/01/19 0335 12/01/19 1016 12/02/19 0214 12/02/19 0542 12/03/19 0207 12/03/19 0744 12/03/19 1341 12/03/19 2104 12/04/19 0404 12/04/19 0747 12/04/19 1339  NA  --    < > 161*   < > 160*   < > 158*   < > 153*   < >  153* 154* 152* 152* 149*  K  --    < > 3.9   < > 4.0   < > 3.3*   < > 3.6   < > 3.4* 3.7 3.5 3.8 3.2*  CL  --    < > 125*   < > 129*   < > 129*   < > 121*   < > 121* 121* 120* 119* 117*  CO2  --    < > 22   < > 22   < > 23   < > 24   < > 23 26 25 23 25   GLUCOSE  --    < > 122*   < > 107*   < > 130*   < > 166*   < > 154* 144* 156* 149* 151*  BUN  --    < > 53*   < > 35*   < > 29*   < > 23   < > 21 20 18 17 15   CREATININE  --    < > 1.54*   < > 1.13*   < > 1.01*   < > 1.19*   < > 1.04* 1.17* 1.09* 1.06* 1.04*  CALCIUM  --    < > 9.0   < > 8.5*   < > 8.5*   < > 8.3*   < > 8.3* 8.4* 8.2* 8.4* 8.1*  MG 2.5*  --  2.6*  --  2.4  --  2.2  --  2.2  --   --   --  2.1  --   --   PHOS 6.0*  --  5.8*  --   --   --   --   --   --   --   --   --   --   --   --    < > = values in this interval not displayed.   Liver Function  Tests: Recent Labs  Lab 11/28/19 1703 11/29/19 0240 11/29/19 1710  AST 168* 148* 119*  ALT 166* 156* 132*  ALKPHOS 253* 244* 225*  BILITOT 1.1 1.2 0.9  PROT 7.0 6.6 6.2*  ALBUMIN 3.4* 3.4* 3.0*   No results for input(s): LIPASE, AMYLASE in the last 168 hours. Recent Labs  Lab 11/28/19 1935  AMMONIA 40*   CBC: Recent Labs  Lab 11/30/19 1051 12/01/19 0335 12/02/19 0214 12/03/19 0207 12/04/19 0404  WBC 8.7 7.8 8.6 7.2 8.5  NEUTROABS 5.9 5.3 5.6 5.7 6.7  HGB 11.8* 11.0* 11.2* 10.8* 10.9*  HCT 37.8 35.3* 36.4 36.0 35.2*  MCV 108.0* 106.3* 107.4* 108.4* 107.0*  PLT 94* 83* 91* 94* 92*   Cardiac Enzymes: Recent Labs  Lab 11/28/19 1935  CKTOTAL 145   BNP: Invalid input(s): POCBNP CBG: Recent Labs  Lab 12/03/19 0159 12/03/19 1837 12/04/19 0231 12/04/19 2325  GLUCAP 145* 120* 143* 124*   D-Dimer No results for input(s): DDIMER in the last 72 hours. Hgb A1c No results for input(s): HGBA1C in the last 72 hours. Lipid Profile No results for input(s): CHOL, HDL, LDLCALC, TRIG, CHOLHDL, LDLDIRECT in the last 72 hours. Thyroid function studies No results for input(s): TSH, T4TOTAL, T3FREE, THYROIDAB in the last 72 hours.  Invalid input(s): FREET3 Anemia work up No results for input(s): VITAMINB12, FOLATE, FERRITIN, TIBC, IRON, RETICCTPCT in the last 72 hours. Urinalysis    Component Value Date/Time   COLORURINE YELLOW 11/29/2019 0454   APPEARANCEUR CLEAR 11/29/2019 0454   LABSPEC 1.020  11/29/2019 0454   PHURINE 5.0 11/29/2019 0454   GLUCOSEU NEGATIVE 11/29/2019 0454   HGBUR NEGATIVE 11/29/2019 0454   BILIRUBINUR NEGATIVE 11/29/2019 0454   KETONESUR NEGATIVE 11/29/2019 0454   PROTEINUR 30 (A) 11/29/2019 0454   NITRITE NEGATIVE 11/29/2019 0454   LEUKOCYTESUR MODERATE (A) 11/29/2019 0454   Sepsis Labs Invalid input(s): PROCALCITONIN,  WBC,  LACTICIDVEN Microbiology Recent Results (from the past 240 hour(s))  SARS Coronavirus 2 by RT PCR (hospital order,  performed in [facility] hospital lab) Nasopharyngeal Nasopharyngeal Swab     Status: None   Collection Time: 11/28/19  6:30 PM   Specimen: Nasopharyngeal Swab  Result Value Ref Range Status   SARS Coronavirus 2 NEGATIVE NEGATIVE Final    Comment: (NOTE) SARS-CoV-2 target nucleic acids are NOT DETECTED.  The SARS-CoV-2 RNA is generally detectable in upper and lower respiratory specimens during the acute phase of infection. The lowest concentration of SARS-CoV-2 viral copies this assay can detect is 250 copies / mL. A negative result does not preclude SARS-CoV-2 infection and should not be used as the sole basis for treatment or other patient management decisions.  A negative result may occur with improper specimen collection / handling, submission of specimen other than nasopharyngeal swab, presence of viral mutation(s) within the areas targeted by this assay, and inadequate number of viral copies (<250 copies / mL). A negative result must be combined with clinical observations, patient history, and epidemiological information.  Fact Sheet for Patients:   BoilerBrush.com.cy  Fact Sheet for Healthcare Providers: https://pope.com/  This test is not yet approved or  cleared by the [place] FDA and has been authorized for detection and/or diagnosis of SARS-CoV-2 by FDA under an Emergency Use Authorization (EUA).  This EUA will remain in effect (meaning this test can be used) for the duration of the COVID-19 declaration under Section 564(b)(1) of the Act, 21 U.S.C. section 360bbb-3(b)(1), unless the authorization is terminated or revoked sooner.  Performed at [facility], [address] [place], [place] [address]      Time coordinating discharge: Over 30 minutes    Lewie Chamber, MD  [facility] 12/05/2019, 8:03 AM Pager: Secure chat  If 7PM-7AM, please contact  night-coverage [email or web address] Password [email or web address]

## 2019-12-04 NOTE — TOC Transition Note (Addendum)
ransition of Care [facility]) - CM/SW Discharge Note   Patient Details  Name: KERILYN CORTNER MRN: [id number] Date of Birth: 1927-03-06  Transition of Care [facility]) CM/SW Contact:  Marina Goodell Phone Number: [phone] 12/04/2019, 1:32 PM   Clinical Narrative:     Patient will d/c to [facility], on 12/05/2019 - [employer] Auth approved, [id number], Elmarie Mainland case manager, 7/9 - 7/13, fax# [phone], Room 1007, Report# [phone], FL2 and D/C summary faxed.  Attending and Floor RN updated.  [facility] scheduled for transport, paperwork in patient binder.  Patient's legal guardian Pietro Cassis [phone], notified of d/c via HIPPA compliant voicemail. CSW spoke with patient's son Yuval Nolet, for d/c update.  Final next level of care: Skilled Nursing Facility Barriers to Discharge: No Barriers Identified   Patient Goals and CMS Choice Patient states their goals for this hospitalization and ongoing recovery are:: Memory Impairment      Discharge Placement                       Discharge Plan and Services                                     Social Determinants of Health (SDOH) Interventions     Readmission Risk Interventions No flowsheet data found.    -

## 2019-12-04 NOTE — Progress Notes (Signed)
PROGRESS NOTE    Kelly Cross   OIB:[id number]  DOB: October 25, 1926  DOA: 11/28/2019     6  PCP: Leeroy Cha, MD  CC: lethargy, hypernatremia  Hospital Course: [name] is a 28 AA female with PMH dementia, hypertension, hyperlipidemia, sarcoidosis with no pulmonary involvement, seizure disorder, osteoporosis, CVA, Rectal prolapse status post rectopexy + colostomy creation 2019 who presented with hypernatremia, sodium 158.  She had also been having reported decreased oral intake and change in mentation.  Her baseline is that she is rarely verbal and has significant dementia per nursing staff at [facility].  The patient was more lethargic than usual and was referred to the ER for further evaluation and treatment.  Other history includes: In May patient was admitted to [facility] with seizures felt to be secondary to hypoglycemia as well as prior CVAs in the Alzheimer's induced epilepsy she undergone further evaluation including CT MRI and EEG and was discharged back to facility on Keppra 750 twice daily and her donepezil was discontinued Her hypoglycemia at that time felt to be secondary to accidental overdose with glipizide as her hypoglycemic panel was positive for glipizide She was also noted to have elevated LFTs in June 2021. AST was 121 ALT 193 CT scan of the liver done at that time showed possibility of early cirrhosis With diffuse fatty infiltration of the liver and gallstones early cirrhosis cirrhosis was confirmed with ultrasound of the liver as well.  She was admitted for treatment of her hypernatremia and placed on D5 LR infusion.  Hypernatremia was refractory to D5 LR infusion, therefore D10 was also added on and sodium levels responded some but never fully normalized.  Her appetite and meal intake remained low/poor throughout hospitalization. Her mentation did improve and she was able to interact some and answer some questions. She was considered back to her  baseline mentation and felt to be stable for d/c back to SNF. However, she does have an extremely poor quality of life and appears to be declining slowly as expected in setting of advanced dementia. Would strongly recommend that hospice be involved in her care with pursuit of comfort as she is expected to continue to decline and not benefit from ongoing futile medical care.    Interval History:  No events overnight.  Resting in bed in no distress at mental baseline.   Old records reviewed in assessment of this patient  ROS: Review of systems not obtained due to patient factors.  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  This seems to be her current state as well -will continue monitoring mentation while correcting hypernatremia, but if electrolyte abnormalities correct with no significant change in mentation, this is likely her baseline - strongly recommend hospice and comfort care; she responded some to infusions however she is likely to continue to decline; ongoing medical care seems futile at this juncture   AKI (acute kidney injury) ([place]) -Continue fluids and trend BMP - renal function responding to IVF; continue   Chronic systolic CHF (congestive heart failure) ([place]) -EF 35 to 40% on April 2021 echo -Monitor for any signs of volume overload while on fluids  Hypernatremia - refractory to D5LR; will add on D10 and monitor BMP q4h then de-escalate as able - mentation has improved some and is considered back to baseline   Seizure disorder ([place]) -Continue  Keppra  Anemia - iron levels adequate; no further workup at this time   Antimicrobials: n/a  DVT prophylaxis: HSQ Code Status: DNR Family Communication: none Disposition Plan:  . Patient came from: SNF        . Barriers to d/c OR  conditions which need to be met to effect a safe d/c: none currently . The current disposition plan is discharge to: SNF Status is: Inpatient  Remains inpatient appropriate because:Persistent severe electrolyte disturbances, IV treatments appropriate due to intensity of illness or inability to take PO and Inpatient level of care appropriate due to severity of illness   Dispo: The patient is from: SNF              Anticipated d/c is to: SNF              Anticipated d/c date is: 1 day              Patient currently is not medically stable to d/c.   Objective: Vitals:   12/03/19 1928 12/04/19 0435 12/04/19 0911 12/04/19 1224  BP: (!) 143/78 (!) 146/79 133/83 (!) 141/86  Pulse: 82 81 74 67  Resp: (!) 25 (!) 22 (!) 22 20  Temp: 97.8 F (36.6 C) 97.8 F (36.6 C)  97.8 F (36.6 C)  TempSrc: Oral Oral  Oral  SpO2: 99% 96% 97% 97%  Weight:      Height:        Intake/Output Summary (Last 24 hours) at 12/04/2019 1450 Last data filed at 12/04/2019 0945 Gross per 24 hour  Intake 1099.94 ml  Output --  Net 1099.94 ml   Filed Weights   11/28/19 2210  Weight: 57.2 kg    Examination: General appearance: Little more awake and alert, following some commands Head: Normocephalic, without obvious abnormality Eyes: Obvious protrusion of eyeballs almost exophthalmos Lungs: clear to auscultation bilaterally Heart: regular rate and rhythm and S1, S2 normal Abdomen: normal findings: bowel sounds normal Extremities: No edema Skin: mobility and turgor normal Neurologic: Moves all 4 extremities spontaneously.  More verbal today and following some commands  Consultants:   N/A  Procedures:   N/A  Data Reviewed: I have personally reviewed following labs and imaging studies Results for orders placed or performed during the hospital encounter of 11/28/19 (from the past 72 hour(s))  Basic metabolic panel     Status: Abnormal   Collection Time: 12/01/19  5:48 PM  Result Value Ref Range    Sodium 159 (H) 135 - 145 mmol/L   Potassium 3.2 (L) 3.5 - 5.1 mmol/L   Chloride 128 (H) 98 - 111 mmol/L   CO2 25 22 - 32 mmol/L   Glucose, Bld 121 (H) 70 - 99 mg/dL    Comment: Glucose reference range applies only to samples taken after fasting for at least 8 hours.   BUN 33 (H) 8 - 23 mg/dL   Creatinine, Ser 9.78 (H) 0.44 - 1.00 mg/dL   Calcium 8.4 (L) 8.9 - 10.3 mg/dL   GFR calc non Af Amer 44 (L) >60 mL/min   GFR calc Af Amer 51 (L) >60 mL/min   Anion gap 6 5 - 15    Comment: Performed at [facility], [address] [place], [place] [address]  Basic metabolic panel     Status: Abnormal   Collection Time: 12/01/19  9:47 PM  Result Value Ref Range   Sodium 157 (H) 135 - 145 mmol/L   Potassium 3.6 3.5 - 5.1  mmol/L   Chloride 124 (H) 98 - 111 mmol/L   CO2 24 22 - 32 mmol/L   Glucose, Bld 149 (H) 70 - 99 mg/dL    Comment: Glucose reference range applies only to samples taken after fasting for at least 8 hours.   BUN 30 (H) 8 - 23 mg/dL   Creatinine, Ser 1.08 (H) 0.44 - 1.00 mg/dL   Calcium 8.5 (L) 8.9 - 10.3 mg/dL   GFR calc non Af Amer 45 (L) >60 mL/min   GFR calc Af Amer 52 (L) >60 mL/min   Anion gap 9 5 - 15    Comment: Performed at [facility], [place] [address], [place], [place] [address]  Basic metabolic panel     Status: Abnormal   Collection Time: 12/02/19  2:14 AM  Result Value Ref Range   Sodium 158 (H) 135 - 145 mmol/L   Potassium 3.3 (L) 3.5 - 5.1 mmol/L   Chloride 129 (H) 98 - 111 mmol/L   CO2 23 22 - 32 mmol/L   Glucose, Bld 130 (H) 70 - 99 mg/dL    Comment: Glucose reference range applies only to samples taken after fasting for at least 8 hours.   BUN 29 (H) 8 - 23 mg/dL   Creatinine, Ser 1.01 (H) 0.44 - 1.00 mg/dL   Calcium 8.5 (L) 8.9 - 10.3 mg/dL   GFR calc non Af Amer 49 (L) >60 mL/min   GFR calc Af Amer 57 (L) >60 mL/min   Anion gap 6 5 - 15    Comment: Performed at [facility], [place] [address] [place], [place] [address]  CBC with Differential/Platelet     Status: Abnormal   Collection Time: 12/02/19  2:14 AM  Result Value Ref Range   WBC 8.6 4.0 - 10.5 K/uL   RBC 3.39 (L) 3.87 - 5.11 MIL/uL   Hemoglobin 11.2 (L) 12.0 - 15.0 g/dL   HCT 36.4 36 - 46 %   MCV 107.4 (H) 80.0 - 100.0 fL   MCH 33.0 26.0 - 34.0 pg   MCHC 30.8 30.0 - 36.0 g/dL   RDW 16.6 (H) 11.5 - 15.5 %   Platelets 91 (L) 150 - 400 K/uL    Comment: SPECIMEN CHECKED FOR CLOTS CONSISTENT WITH PREVIOUS RESULT Immature Platelet Fraction may be clinically indicated, consider ordering this additional test [id number]    nRBC 0.0 0.0 - 0.2 %   Neutrophils Relative % 65 %   Neutro Abs 5.6 1.7 - 7.7 K/uL   Lymphocytes Relative 21 %   Lymphs Abs 1.8 0.7 - 4.0 K/uL   Monocytes Relative 10 %   Monocytes Absolute 0.8 0 - 1 K/uL   Eosinophils Relative 3 %   Eosinophils Absolute 0.3 0 - 0 K/uL   Basophils Relative 1 %   Basophils Absolute 0.1 0 - 0 K/uL   Immature Granulocytes 0 %   Abs Immature Granulocytes 0.02 0.00 - 0.07 K/uL    Comment: Performed at [facility], [place] [address], [place], [place] [address]  Magnesium     Status: None   Collection Time: 12/02/19  2:14 AM  Result Value Ref Range   Magnesium 2.2 1.7 - 2.4 mg/dL    Comment: Performed at [facility], [place] [address], [place], [place] [address]  Basic metabolic panel     Status: Abnormal   Collection Time: 12/02/19  5:42 AM  Result Value Ref Range   Sodium 159 (H)  135 - 145 mmol/L   Potassium 3.3 (L) 3.5 - 5.1 mmol/L   Chloride 126 (H) 98 - 111 mmol/L   CO2 24 22 - 32 mmol/L   Glucose, Bld 125 (H) 70 - 99 mg/dL    Comment: Glucose reference range applies only to samples taken after fasting for at least 8 hours.   BUN 28 (H) 8 - 23 mg/dL   Creatinine, Ser 1.01 (H) 0.44 - 1.00 mg/dL   Calcium 8.3 (L) 8.9 - 10.3 mg/dL   GFR calc non Af Amer 49 (L) >60 mL/min   GFR calc Af Amer 57 (L) >60 mL/min   Anion gap 9 5 -  15    Comment: Performed at [facility], [place] [address], [place], [place] [address]  Basic metabolic panel     Status: Abnormal   Collection Time: 12/02/19  9:46 AM  Result Value Ref Range   Sodium 156 (H) 135 - 145 mmol/L   Potassium 3.2 (L) 3.5 - 5.1 mmol/L   Chloride 127 (H) 98 - 111 mmol/L   CO2 23 22 - 32 mmol/L   Glucose, Bld 128 (H) 70 - 99 mg/dL    Comment: Glucose reference range applies only to samples taken after fasting for at least 8 hours.   BUN 29 (H) 8 - 23 mg/dL   Creatinine, Ser 1.10 (H) 0.44 - 1.00 mg/dL   Calcium 8.2 (L) 8.9 - 10.3 mg/dL   GFR calc non Af Amer 44 (L) >60 mL/min   GFR calc Af Amer 51 (L) >60 mL/min   Anion gap 6 5 - 15    Comment: Performed at [facility], [place] [address], [place], [place] [address]  Basic metabolic panel     Status: Abnormal   Collection Time: 12/02/19  1:45 PM  Result Value Ref Range   Sodium 155 (H) 135 - 145 mmol/L   Potassium 3.7 3.5 - 5.1 mmol/L   Chloride 125 (H) 98 - 111 mmol/L   CO2 22 22 - 32 mmol/L   Glucose, Bld 154 (H) 70 - 99 mg/dL    Comment: Glucose reference range applies only to samples taken after fasting for at least 8 hours.   BUN 27 (H) 8 - 23 mg/dL   Creatinine, Ser 1.14 (H) 0.44 - 1.00 mg/dL   Calcium 8.3 (L) 8.9 - 10.3 mg/dL   GFR calc non Af Amer 42 (L) >60 mL/min   GFR calc Af Amer 49 (L) >60 mL/min   Anion gap 8 5 - 15    Comment: Performed at [facility], [place] [address] [place], [place] [address]  Basic metabolic panel     Status: Abnormal   Collection Time: 12/02/19  9:17 PM  Result Value Ref Range   Sodium 153 (H) 135 - 145 mmol/L   Potassium 3.6 3.5 - 5.1 mmol/L   Chloride 124 (H) 98 - 111 mmol/L   CO2 24 22 - 32 mmol/L   Glucose, Bld 143 (H) 70 - 99 mg/dL    Comment: Glucose reference range applies only to samples taken after fasting for at least 8 hours.   BUN 25 (H) 8 - 23 mg/dL   Creatinine, Ser 1.13 (H) 0.44 - 1.00 mg/dL    Calcium 8.3 (L) 8.9 - 10.3 mg/dL   GFR calc non Af Amer 43 (L) >60 mL/min   GFR calc Af Amer 50 (L) >60 mL/min   Anion gap 5 5 - 15  Comment: Performed at [facility], [place] [address] [place], [place] [address]  Glucose, capillary     Status: Abnormal   Collection Time: 12/03/19  1:59 AM  Result Value Ref Range   Glucose-Capillary 145 (H) 70 - 99 mg/dL    Comment: Glucose reference range applies only to samples taken after fasting for at least 8 hours.  Basic metabolic panel     Status: Abnormal   Collection Time: 12/03/19  2:07 AM  Result Value Ref Range   Sodium 153 (H) 135 - 145 mmol/L   Potassium 3.6 3.5 - 5.1 mmol/L   Chloride 121 (H) 98 - 111 mmol/L   CO2 24 22 - 32 mmol/L   Glucose, Bld 166 (H) 70 - 99 mg/dL    Comment: Glucose reference range applies only to samples taken after fasting for at least 8 hours.   BUN 23 8 - 23 mg/dL   Creatinine, Ser 1.19 (H) 0.44 - 1.00 mg/dL   Calcium 8.3 (L) 8.9 - 10.3 mg/dL   GFR calc non Af Amer 40 (L) >60 mL/min   GFR calc Af Amer 47 (L) >60 mL/min   Anion gap 8 5 - 15    Comment: Performed at [facility], [place] [address], [place], [place] [address]  CBC with Differential/Platelet     Status: Abnormal   Collection Time: 12/03/19  2:07 AM  Result Value Ref Range   WBC 7.2 4.0 - 10.5 K/uL   RBC 3.32 (L) 3.87 - 5.11 MIL/uL   Hemoglobin 10.8 (L) 12.0 - 15.0 g/dL   HCT 36.0 36 - 46 %   MCV 108.4 (H) 80.0 - 100.0 fL   MCH 32.5 26.0 - 34.0 pg   MCHC 30.0 30.0 - 36.0 g/dL   RDW 16.6 (H) 11.5 - 15.5 %   Platelets 94 (L) 150 - 400 K/uL    Comment: SPECIMEN CHECKED FOR CLOTS CONSISTENT WITH PREVIOUS RESULT Immature Platelet Fraction may be clinically indicated, consider ordering this additional test [id number] REPEATED TO VERIFY    nRBC 0.0 0.0 - 0.2 %   Neutrophils Relative % 79 %   Neutro Abs 5.7 1.7 - 7.7 K/uL   Lymphocytes Relative 12 %   Lymphs Abs 0.9 0.7 - 4.0 K/uL   Monocytes Relative 7  %   Monocytes Absolute 0.5 0 - 1 K/uL   Eosinophils Relative 1 %   Eosinophils Absolute 0.1 0 - 0 K/uL   Basophils Relative 1 %   Basophils Absolute 0.0 0 - 0 K/uL   Immature Granulocytes 0 %   Abs Immature Granulocytes 0.02 0.00 - 0.07 K/uL    Comment: Performed at [facility], [place] [address] [place], [place] [address]  Magnesium     Status: None   Collection Time: 12/03/19  2:07 AM  Result Value Ref Range   Magnesium 2.2 1.7 - 2.4 mg/dL    Comment: Performed at [facility], [place] [address], [place], [place] [address]  Basic metabolic panel     Status: Abnormal   Collection Time: 12/03/19  7:44 AM  Result Value Ref Range   Sodium 149 (H) 135 - 145 mmol/L   Potassium 3.7 3.5 - 5.1 mmol/L   Chloride 122 (H) 98 - 111 mmol/L   CO2 23 22 - 32 mmol/L   Glucose, Bld 147 (H) 70 - 99 mg/dL    Comment: Glucose reference range applies only to samples taken after fasting for at least  8 hours.   BUN 21 8 - 23 mg/dL   Creatinine, Ser 1.02 (H) 0.44 - 1.00 mg/dL   Calcium 8.1 (L) 8.9 - 10.3 mg/dL   GFR calc non Af Amer 48 (L) >60 mL/min   GFR calc Af Amer 56 (L) >60 mL/min   Anion gap 4 (L) 5 - 15    Comment: Performed at [facility], [place] [address], [place], [place] [address]  Basic metabolic panel     Status: Abnormal   Collection Time: 12/03/19  1:41 PM  Result Value Ref Range   Sodium 153 (H) 135 - 145 mmol/L   Potassium 3.4 (L) 3.5 - 5.1 mmol/L   Chloride 121 (H) 98 - 111 mmol/L   CO2 23 22 - 32 mmol/L   Glucose, Bld 154 (H) 70 - 99 mg/dL    Comment: Glucose reference range applies only to samples taken after fasting for at least 8 hours.   BUN 21 8 - 23 mg/dL   Creatinine, Ser 1.04 (H) 0.44 - 1.00 mg/dL   Calcium 8.3 (L) 8.9 - 10.3 mg/dL   GFR calc non Af Amer 47 (L) >60 mL/min   GFR calc Af Amer 55 (L) >60 mL/min   Anion gap 9 5 - 15    Comment: Performed at [facility], [place] [address],  [place], [place] [address]  Glucose, capillary     Status: Abnormal   Collection Time: 12/03/19  6:37 PM  Result Value Ref Range   Glucose-Capillary 120 (H) 70 - 99 mg/dL    Comment: Glucose reference range applies only to samples taken after fasting for at least 8 hours.  Basic metabolic panel     Status: Abnormal   Collection Time: 12/03/19  9:04 PM  Result Value Ref Range   Sodium 154 (H) 135 - 145 mmol/L   Potassium 3.7 3.5 - 5.1 mmol/L   Chloride 121 (H) 98 - 111 mmol/L   CO2 26 22 - 32 mmol/L   Glucose, Bld 144 (H) 70 - 99 mg/dL    Comment: Glucose reference range applies only to samples taken after fasting for at least 8 hours.   BUN 20 8 - 23 mg/dL   Creatinine, Ser 1.17 (H) 0.44 - 1.00 mg/dL   Calcium 8.4 (L) 8.9 - 10.3 mg/dL   GFR calc non Af Amer 41 (L) >60 mL/min   GFR calc Af Amer 48 (L) >60 mL/min   Anion gap 7 5 - 15    Comment: Performed at [facility], [place] [address] [place], [place] [address]  Glucose, capillary     Status: Abnormal   Collection Time: 12/04/19  2:31 AM  Result Value Ref Range   Glucose-Capillary 143 (H) 70 - 99 mg/dL    Comment: Glucose reference range applies only to samples taken after fasting for at least 8 hours.  Basic metabolic panel     Status: Abnormal   Collection Time: 12/04/19  4:04 AM  Result Value Ref Range   Sodium 152 (H) 135 - 145 mmol/L   Potassium 3.5 3.5 - 5.1 mmol/L   Chloride 120 (H) 98 - 111 mmol/L   CO2 25 22 - 32 mmol/L   Glucose, Bld 156 (H) 70 - 99 mg/dL    Comment: Glucose reference range applies only to samples taken after fasting for at least 8 hours.   BUN 18 8 - 23 mg/dL   Creatinine, Ser 1.09 (H) 0.44 - 1.00 mg/dL  Calcium 8.2 (L) 8.9 - 10.3 mg/dL   GFR calc non Af Amer 45 (L) >60 mL/min   GFR calc Af Amer 52 (L) >60 mL/min   Anion gap 7 5 - 15    Comment: Performed at [facility], [place] [address], [place], [place] [address]  CBC with Differential/Platelet     Status:  Abnormal   Collection Time: 12/04/19  4:04 AM  Result Value Ref Range   WBC 8.5 4.0 - 10.5 K/uL   RBC 3.29 (L) 3.87 - 5.11 MIL/uL   Hemoglobin 10.9 (L) 12.0 - 15.0 g/dL   HCT 35.2 (L) 36 - 46 %   MCV 107.0 (H) 80.0 - 100.0 fL   MCH 33.1 26.0 - 34.0 pg   MCHC 31.0 30.0 - 36.0 g/dL   RDW 16.3 (H) 11.5 - 15.5 %   Platelets 92 (L) 150 - 400 K/uL    Comment: SPECIMEN CHECKED FOR CLOTS CONSISTENT WITH PREVIOUS RESULT Immature Platelet Fraction may be clinically indicated, consider ordering this additional test [id number] PLATELET COUNT CONFIRMED BY SMEAR    nRBC 0.0 0.0 - 0.2 %   Neutrophils Relative % 80 %   Neutro Abs 6.7 1.7 - 7.7 K/uL   Lymphocytes Relative 11 %   Lymphs Abs 1.0 0.7 - 4.0 K/uL   Monocytes Relative 8 %   Monocytes Absolute 0.6 0 - 1 K/uL   Eosinophils Relative 1 %   Eosinophils Absolute 0.1 0 - 0 K/uL   Basophils Relative 0 %   Basophils Absolute 0.0 0 - 0 K/uL   Immature Granulocytes 0 %   Abs Immature Granulocytes 0.02 0.00 - 0.07 K/uL    Comment: Performed at [facility], [place] [address] [place] [address], [place], [place] [address]  Magnesium     Status: None   Collection Time: 12/04/19  4:04 AM  Result Value Ref Range   Magnesium 2.1 1.7 - 2.4 mg/dL    Comment: Performed at [facility], [place] [address] [place], [place] [address]  Basic metabolic panel     Status: Abnormal   Collection Time: 12/04/19  7:47 AM  Result Value Ref Range   Sodium 152 (H) 135 - 145 mmol/L   Potassium 3.8 3.5 - 5.1 mmol/L   Chloride 119 (H) 98 - 111 mmol/L   CO2 23 22 - 32 mmol/L   Glucose, Bld 149 (H) 70 - 99 mg/dL    Comment: Glucose reference range applies only to samples taken after fasting for at least 8 hours.   BUN 17 8 - 23 mg/dL   Creatinine, Ser 1.06 (H) 0.44 - 1.00 mg/dL   Calcium 8.4 (L) 8.9 - 10.3 mg/dL   GFR calc non Af Amer 46 (L) >60 mL/min   GFR calc Af Amer 54 (L) >60 mL/min   Anion gap 10 5 - 15    Comment: Performed at [facility], [place] [address], [place], [place] [address]  Basic metabolic panel     Status: Abnormal   Collection Time: 12/04/19  1:39 PM  Result Value Ref Range   Sodium 149 (H) 135 - 145 mmol/L   Potassium 3.2 (L) 3.5 - 5.1 mmol/L   Chloride 117 (H) 98 - 111 mmol/L   CO2 25 22 - 32 mmol/L   Glucose, Bld 151 (H) 70 - 99 mg/dL    Comment: Glucose reference range applies only to samples taken after fasting for at least 8 hours.   BUN 15  8 - 23 mg/dL   Creatinine, Ser 1.04 (H) 0.44 - 1.00 mg/dL   Calcium 8.1 (L) 8.9 - 10.3 mg/dL   GFR calc non Af Amer 47 (L) >60 mL/min   GFR calc Af Amer 55 (L) >60 mL/min   Anion gap 7 5 - 15    Comment: Performed at [facility], [place] [address] [place], [place] [address]     Recent Results (from the past 240 hour(s))  SARS Coronavirus 2 by RT PCR (hospital order, performed in [facility] hospital lab) Nasopharyngeal Nasopharyngeal Swab     Status: None   Collection Time: 11/28/19  6:30 PM   Specimen: Nasopharyngeal Swab  Result Value Ref Range Status   SARS Coronavirus 2 NEGATIVE NEGATIVE Final    Comment: (NOTE) SARS-CoV-2 target nucleic acids are NOT DETECTED.  The SARS-CoV-2 RNA is generally detectable in upper and lower respiratory specimens during the acute phase of infection. The lowest concentration of SARS-CoV-2 viral copies this assay can detect is 250 copies / mL. A negative result does not preclude SARS-CoV-2 infection and should not be used as the sole basis for treatment or other patient management decisions.  A negative result may occur with improper specimen collection / handling, submission of specimen other than nasopharyngeal swab, presence of viral mutation(s) within the areas targeted by this assay, and inadequate number of viral copies (<250 copies / mL). A negative result must be combined with clinical observations, patient history, and epidemiological information.  Fact Sheet for Patients:    StrictlyIdeas.no  Fact Sheet for Healthcare Providers: BankingDealers.co.za  This test is not yet approved or  cleared by the [place] FDA and has been authorized for detection and/or diagnosis of SARS-CoV-2 by FDA under an Emergency Use Authorization (EUA).  This EUA will remain in effect (meaning this test can be used) for the duration of the COVID-19 declaration under Section 564(b)(1) of the Act, 21 U.S.C. section 360bbb-3(b)(1), unless the authorization is terminated or revoked sooner.  Performed at [facility], [place] [address] [place],  [address]      Radiology Studies: No results found. DG Chest Port 1 View  Final Result    DG Chest Port 1 View  Final Result       Scheduled Meds: . aspirin  81 mg Oral Daily  . atorvastatin  40 mg Oral Daily  . heparin  5,000 Units Subcutaneous Q8H  . levETIRAcetam  750 mg Oral BID  . metoprolol tartrate  12.5 mg Oral Daily  . multivitamin  15 mL Oral Daily  . sodium chloride flush  3 mL Intravenous Q12H   PRN Meds: acetaminophen **OR** acetaminophen, albuterol, ondansetron **OR** ondansetron (ZOFRAN) IV, Resource ThickenUp Clear Continuous Infusions: . dextrose 5% lactated ringers 50 mL/hr at 12/04/19 1210      LOS: 6 days  Time spent: Greater than 50% of the 35 minute visit was spent in counseling/coordination of care for the patient as laid out in the A&P.   Dwyane Dee, MD [facility] 12/04/2019, 2:50 PM   Contact via secure chat.  To contact the attending provider between 7A-7P or the covering provider during after hours 7P-7A, please log into the web site [email or web address] and access using universal [place] password for that web site. If you do not have the password, please call the hospital operator.

## 2019-12-05 LAB — BASIC METABOLIC PANEL
Anion gap: 7 (ref 5–15)
BUN: 16 mg/dL (ref 8–23)
CO2: 24 mmol/L (ref 22–32)
Calcium: 8.1 mg/dL — ABNORMAL LOW (ref 8.9–10.3)
Chloride: 122 mmol/L — ABNORMAL HIGH (ref 98–111)
Creatinine, Ser: 0.96 mg/dL (ref 0.44–1.00)
GFR calc Af Amer: >60 mL/min (ref >60)
GFR calc non Af Amer: 52 mL/min — ABNORMAL LOW (ref >60)
Glucose, Bld: 86 mg/dL (ref 70–99)
Potassium: 3.3 mmol/L — ABNORMAL LOW (ref 3.5–5.1)
Sodium: 153 mmol/L — ABNORMAL HIGH (ref 135–145)

## 2019-12-05 LAB — CBC WITH DIFFERENTIAL/PLATELET
Abs Immature Granulocytes: 0.02 10*3/uL (ref 0.00–0.07)
Basophils Absolute: 0 10*3/uL (ref 0.0–0.1)
Basophils Relative: 0 %
Eosinophils Absolute: 0.2 10*3/uL (ref 0.0–0.5)
Eosinophils Relative: 2 %
HCT: 34.4 % — ABNORMAL LOW (ref 36.0–46.0)
Hemoglobin: 10.9 g/dL — ABNORMAL LOW (ref 12.0–15.0)
Immature Granulocytes: 0 %
Lymphocytes Relative: 18 %
Lymphs Abs: 1.3 10*3/uL (ref 0.7–4.0)
MCH: 33.4 pg (ref 26.0–34.0)
MCHC: 31.7 g/dL (ref 30.0–36.0)
MCV: 105.5 fL — ABNORMAL HIGH (ref 80.0–100.0)
Monocytes Absolute: 0.8 10*3/uL (ref 0.1–1.0)
Monocytes Relative: 11 %
Neutro Abs: 4.9 10*3/uL (ref 1.7–7.7)
Neutrophils Relative %: 69 %
Platelets: 87 10*3/uL — ABNORMAL LOW (ref 150–400)
RBC: 3.26 MIL/uL — ABNORMAL LOW (ref 3.87–5.11)
RDW: 16.5 % — ABNORMAL HIGH (ref 11.5–15.5)
WBC: 7.2 10*3/uL (ref 4.0–10.5)
nRBC: 0 % (ref 0.0–0.2)

## 2019-12-05 LAB — MAGNESIUM: Magnesium: 1.9 mg/dL (ref 1.7–2.4)

## 2019-12-05 NOTE — Progress Notes (Signed)
Nutrition Follow-up  DOCUMENTATION CODES:   Not applicable  INTERVENTION:  - continue Magic Cup TID.  NUTRITION DIAGNOSIS:   Inadequate oral intake related to lethargy/confusion as evidenced by meal completion < 25%. -ongoing  GOAL:   Patient will meet greater than or equal to 90% of their needs -unmet  MONITOR:   PO intake, Supplement acceptance, Labs, Weight trends  ASSESSMENT:   86 y.o. female with medical history of dementia, HTN, HLD, sarcoidosis with no pulmonary involvement, recent seizure, osteoporosis, stroke, advanced dementia with minimal verbalization, and cirrhosis. She presented from [facility] due to decreased PO intake for several days, increased lethargy, and hypernatremia (serum Na: 158).  Intakes have been 0-25% over the past 1 week. She is disoriented x4. Discharge order and discharge summary entered earlier this AM for planned discharge to SNF.     Labs reviewed; Na: 153 mmol/l, K: 3.3 mmol/l, Cl: 122 mmol/l, Ca: 8.1 mg/dl. Medications reviewed; 15 ml multivitamin/day. IVF; D5-LR @ 50 ml/hr (204 kcal).   Diet Order:   Diet Order            DIET - DYS 1 Room service appropriate? No; Fluid consistency: Nectar Thick  Diet effective now                 EDUCATION NEEDS:   Not appropriate for education at this time  Skin:  Skin Assessment: Reviewed RN Assessment  Last BM:  7/9  Height:   Ht Readings from Last 1 Encounters:  11/28/19 5' 2.99" (1.6 m)    Weight:   Wt Readings from Last 1 Encounters:  11/28/19 57.2 kg    Estimated Nutritional Needs:  Kcal:  1200-1400 kcal Protein:  50-60 grams Fluid:  >/= 1.7 L/day     Trenton Gammon, MS, RD, LDN, CNSC Inpatient Clinical Dietitian RD pager # available in [facility]  After hours/weekend pager # available in [facility]

## 2019-12-07 ENCOUNTER — Non-Acute Institutional Stay: Payer: Self-pay | Admitting: Internal Medicine

## 2019-12-07 DIAGNOSIS — Z515 Encounter for palliative care: Secondary | ICD-10-CM

## 2019-12-07 DIAGNOSIS — F0280 Dementia in other diseases classified elsewhere without behavioral disturbance: Secondary | ICD-10-CM

## 2019-12-08 ENCOUNTER — Other Ambulatory Visit: Payer: Self-pay

## 2019-12-08 NOTE — Progress Notes (Addendum)
Therapist, nutritional Palliative Care Consult Note Telephone: [phone]  Fax: [phone]  PATIENT NAME: Kelly Cross DOB: July 12, 1926 MRN: [id number]  PRIMARY CARE PROVIDER:   Patient, No Pcp Per  REFERRING PROVIDER:  No referring provider defined for this encounter.  RESPONSIBLE PARTY:    Turner,Rykiell Legal Guardian [phone]  [phone]   [facility] DSS      RECOMMENDATIONS and PLAN:  Palliative Care Encounter Z51.5  1.Advance care planning:  Reviewed MOST form and confirmed pt's selections with gaurdian.  Her advanced directives are DNAR/DNI, limited additional interventions, determine use of antibiotics, discuss use of IVs and tube feedings with gaurdian. MOST and DNR forms were uploaded into chart. Caregiver will need to submit current health records for review to proceed with Palliative or Hospice care services. Discussed the rapid overall decline of patient and need for expedient decision making.  Palliaitve and Hospice consent forms and medical records will be faxed to DSS guardian.  Palliative care will continue to f/u with patient.   2.  Alzheimer's dementia without behavioral disturbance:  End stage.   FAST stage 7d.  Rapid cognitive and functional decline over past 2 weeks. Consider transition to Hospice for comfort care.  3.  Debilitation:  Related to multiple comorbidities and advanced age.  Pt appears to be nearing end of life.  Recommendations for transition to hospice care from inpatient MD, Drs. Blake and Monguilod for comfort care. Patient is unable to participate in directed physical activities.   I spent 90 minutes providing this consultation,  from 1030 to 1200. More than 50% of the time in this consultation was spent coordinating communication with patient, clinical staff, SW,  Uvaldo Bristle, NP, hospice MD and guardian Ms. Turner.   HISTORY OF PRESENT ILLNESS:  Kelly Cross is a 86 y.o. year old female with multiple  medical problems including Alzheimer's dementia, seizure disorder and sarcodosis.  She was recently hospitalized from 7/5-7/12 due to altered mental status and hypernatremia. Clinical staff reports reports a dramatic change of her baseline. She now requires total care for all ADLs and is bedridden, is intermittently responsive with eye opening, nonverbal, may eat only bites and has sips of beverages. Prior to hospitalization, she was talkative and able to feed herself, give instructions and could transfer from bed and chair. Palliative Care was asked to help address goals of care.   CODE STATUS: DNAR/DNI  PPS: 20% weak.  Decreased from 40% 2 weeks ago. HOSPICE ELIGIBILITY/DIAGNOSIS: YES/ End stage Alzheimer's disease  PAST MEDICAL HISTORY:  Past Medical History:  Diagnosis Date  . Alzheimer's dementia (HCC)   . Anxiety   . Arthritis   . Complication of anesthesia    harder to wake up from Anesthesia  . Family history of adverse reaction to anesthesia    son- Harder to wake up from Anesthesia  . High blood pressure   . High cholesterol   . Sarcoidosis    lungs- no problems for a long time    SOCIAL HX:  Social History   Tobacco Use  . Smoking status: Never Smoker  . Smokeless tobacco: Never Used  Substance Use Topics  . Alcohol use: No    ALLERGIES:  Allergies  Allergen Reactions  . Warfarin And Related Other (See Comments)    unknown     PERTINENT MEDICATIONS:  Outpatient Encounter Medications as of 12/07/2019  Medication Sig  . aspirin 81 MG chewable tablet Chew 81 mg by mouth daily.  Marland Kitchen levETIRAcetam (  KEPPRA) 100 MG/ML solution Take 750 mg by mouth 2 (two) times daily.  Marland Kitchen lisinopril (ZESTRIL) 5 MG tablet Take 5 mg by mouth daily.  . memantine (NAMENDA) 10 MG tablet Take 10 mg by mouth 2 (two) times daily.  . metoprolol tartrate (LOPRESSOR) 25 MG tablet Take 12.5 mg by mouth daily.  . polyethylene glycol (MIRALAX / GLYCOLAX) 17 g packet Take 17 g by mouth daily.  .  potassium chloride SA (K-DUR,KLOR-CON) 20 MEQ tablet Take 20 mEq by mouth every other day.   . sertraline (ZOLOFT) 100 MG tablet Take 100 mg by mouth daily.   . Suvorexant (BELSOMRA) 5 MG TABS Take 5 mg by mouth at bedtime.   No facility-administered encounter medications on file as of 12/07/2019.    PHYSICAL EXAM:   General: Uncomfortable in appearance but denies pain with shaking head "no",  Chronically ill and  frail appearing, thin EENT:  Dry mucus membranes Cardiovascular: regular rate and rhythm Pulmonary: increased respirations at 28/min Abdomen: soft, nontender, + bowel sounds.  Colostomy noted and stoma is pink GU: no suprapubic tenderness Extremities: no edema Skin:scattered ecchymosis of BUE Neurological: Somnolent but arouses to name. Turns head to and fro.  Nods yes or no occasionally to questions but does not follow commands. Weakness   Margaretha Sheffield, NP-C

## 2021-01-10 IMAGING — DX DG CHEST 1V PORT
1 series · 1 of 1 positions shown · non-contrast
Comparison: Portable exam 8269 hours compared to 11/28/2019

CLINICAL DATA: Increased confusion this morning, history pneumonia,
Alzheimer's, sarcoidosis, cirrhosis, CHF, hypertension

EXAM:
PORTABLE CHEST 1 VIEW

[chest ap]
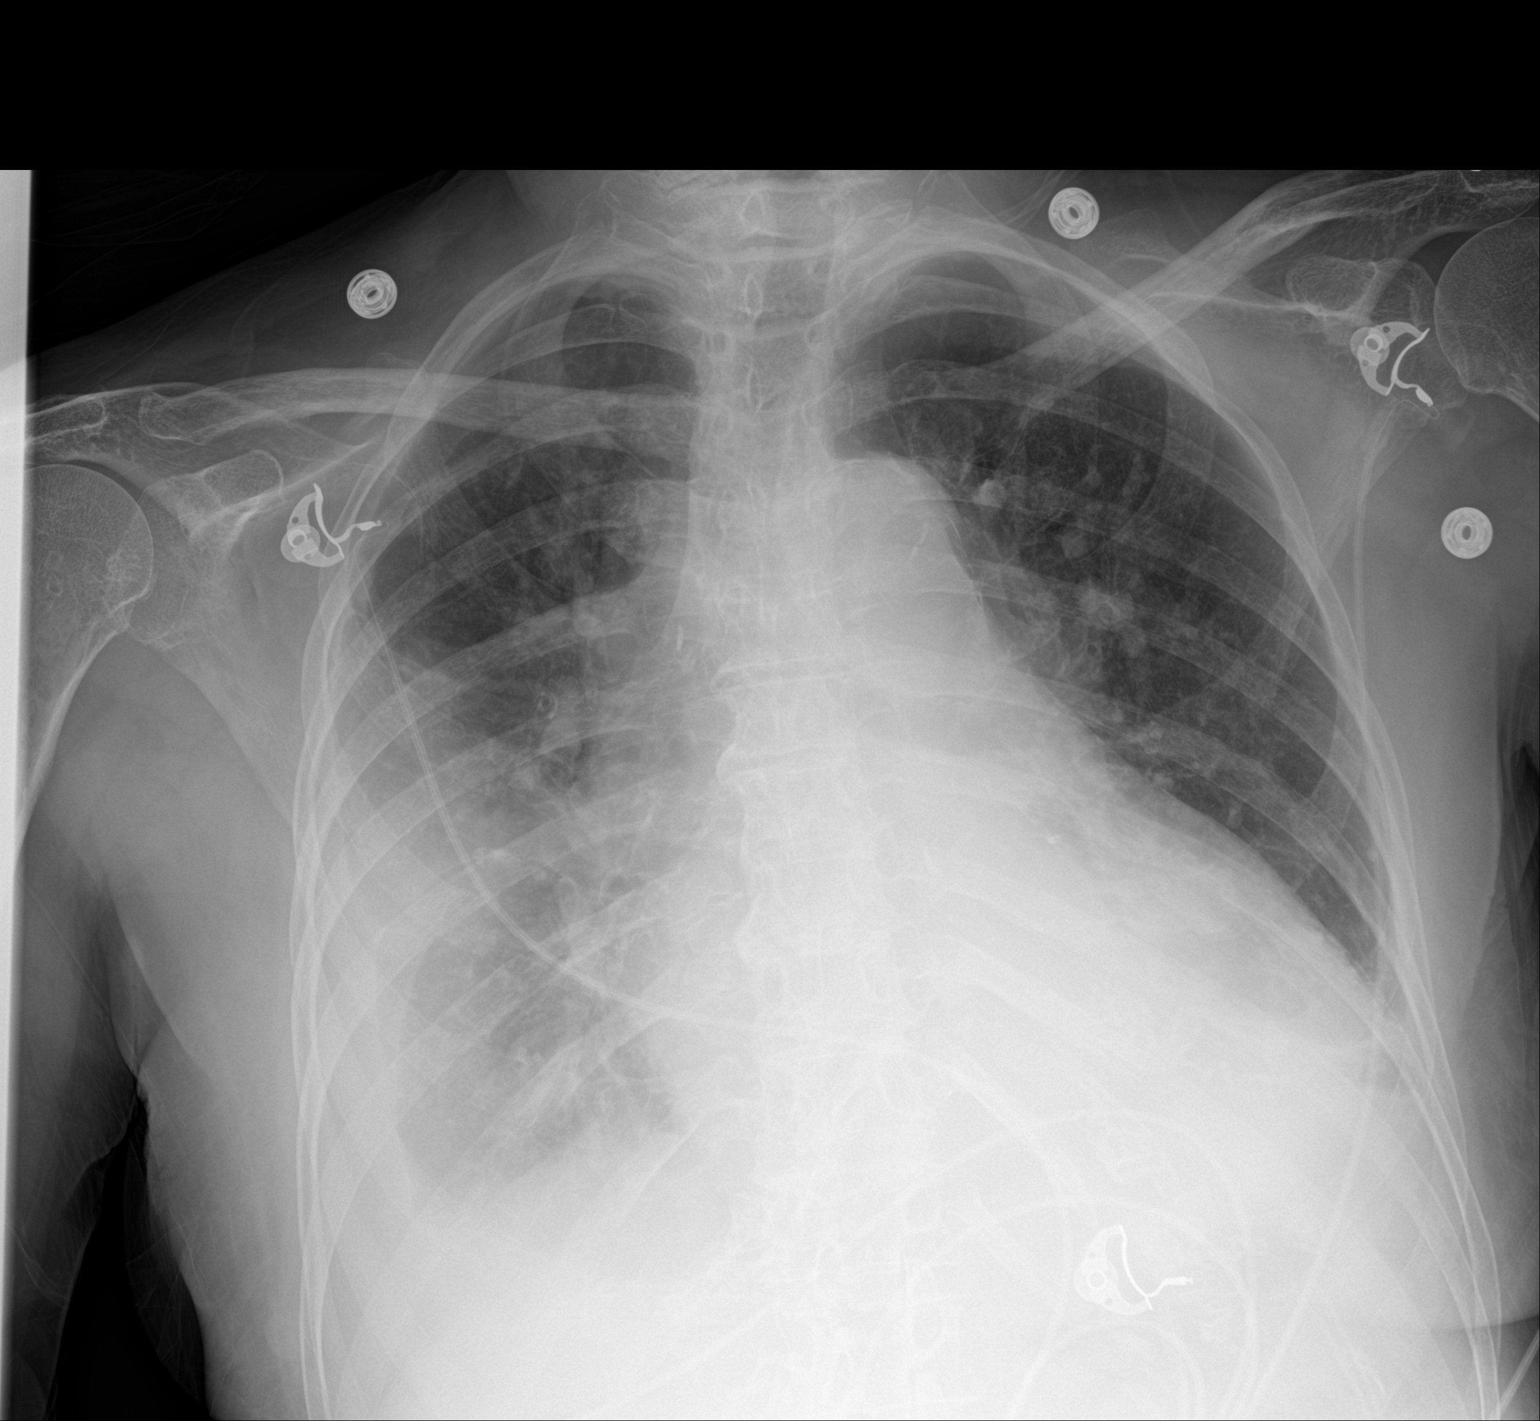

[1 of 1 positions shown; findings below may reference images not displayed]

FINDINGS: Enlargement of cardiac silhouette with pulmonary vascular
congestion.

Atherosclerotic calcification aorta.

In increased RIGHT pleural effusion and basilar atelectasis.

Minimal effusion and atelectasis at LEFT base.

No pneumothorax.

Bones demineralized with mild degenerative disc disease changes
thoracic spine.
IMPRESSION: Bibasilar pleural effusions and atelectasis LEFT greater than RIGHT,
slightly increased on LEFT since prior study.

Enlargement of cardiac silhouette with pulmonary vascular
congestion.
